# Patient Record
Sex: Male | Born: 1940
Health system: Southern US, Community
[De-identification: ages and names within clinical notes are randomized; demographics above are authoritative.]

## PROBLEM LIST (undated history)

## (undated) DIAGNOSIS — E119 Type 2 diabetes mellitus without complications: Secondary | ICD-10-CM

## (undated) DIAGNOSIS — M109 Gout, unspecified: Secondary | ICD-10-CM

## (undated) DIAGNOSIS — I272 Pulmonary hypertension, unspecified: Secondary | ICD-10-CM

## (undated) DIAGNOSIS — N189 Chronic kidney disease, unspecified: Secondary | ICD-10-CM

## (undated) DIAGNOSIS — C9110 Chronic lymphocytic leukemia of B-cell type not having achieved remission: Secondary | ICD-10-CM

## (undated) DIAGNOSIS — I10 Essential (primary) hypertension: Secondary | ICD-10-CM

## (undated) HISTORY — DX: Chronic lymphocytic leukemia of B-cell type not having achieved remission: C91.10

## (undated) HISTORY — PX: OTHER SURGICAL HISTORY: SHX169

## (undated) HISTORY — DX: Pulmonary hypertension, unspecified: I27.20

## (undated) HISTORY — DX: Gout, unspecified: M10.9

## (undated) HISTORY — DX: Essential (primary) hypertension: I10

## (undated) HISTORY — DX: Chronic kidney disease, unspecified: N18.9

## (undated) HISTORY — DX: Type 2 diabetes mellitus without complications: E11.9

## (undated) HISTORY — PX: EXPLORATORY LAPAROTOMY: SUR591

---

## 2000-10-01 ENCOUNTER — Inpatient Hospital Stay (HOSPITAL_COMMUNITY): Admission: EM | Admit: 2000-10-01 | Discharge: 2000-10-08 | Payer: Self-pay | Admitting: *Deleted

## 2004-02-18 ENCOUNTER — Ambulatory Visit: Payer: Self-pay | Admitting: Internal Medicine

## 2004-02-18 ENCOUNTER — Encounter: Payer: Self-pay | Admitting: Emergency Medicine

## 2004-02-18 ENCOUNTER — Inpatient Hospital Stay (HOSPITAL_COMMUNITY): Admission: EM | Admit: 2004-02-18 | Discharge: 2004-04-14 | Payer: Self-pay

## 2004-02-18 ENCOUNTER — Ambulatory Visit: Payer: Self-pay

## 2004-02-18 ENCOUNTER — Ambulatory Visit: Payer: Self-pay | Admitting: Cardiology

## 2004-02-18 ENCOUNTER — Ambulatory Visit: Payer: Self-pay | Admitting: Pulmonary Disease

## 2004-04-14 ENCOUNTER — Ambulatory Visit: Payer: Self-pay | Admitting: Physical Medicine & Rehabilitation

## 2004-04-14 ENCOUNTER — Inpatient Hospital Stay (HOSPITAL_COMMUNITY)
Admission: RE | Admit: 2004-04-14 | Discharge: 2004-05-02 | Payer: Self-pay | Admitting: Physical Medicine & Rehabilitation

## 2004-05-24 ENCOUNTER — Ambulatory Visit: Payer: Self-pay | Admitting: *Deleted

## 2004-10-19 ENCOUNTER — Ambulatory Visit (HOSPITAL_COMMUNITY): Admission: RE | Admit: 2004-10-19 | Discharge: 2004-10-19 | Payer: Self-pay | Admitting: Orthopedic Surgery

## 2004-10-19 ENCOUNTER — Ambulatory Visit (HOSPITAL_BASED_OUTPATIENT_CLINIC_OR_DEPARTMENT_OTHER): Admission: RE | Admit: 2004-10-19 | Discharge: 2004-10-19 | Payer: Self-pay | Admitting: Orthopedic Surgery
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
## 2010-04-23 ENCOUNTER — Encounter: Payer: Self-pay | Admitting: Physical Medicine & Rehabilitation

## 2011-12-14 ENCOUNTER — Encounter: Payer: Self-pay | Admitting: Internal Medicine

## 2011-12-14 DIAGNOSIS — N183 Chronic kidney disease, stage 3 unspecified: Secondary | ICD-10-CM

## 2011-12-14 DIAGNOSIS — C9110 Chronic lymphocytic leukemia of B-cell type not having achieved remission: Secondary | ICD-10-CM

## 2012-09-12 ENCOUNTER — Encounter (INDEPENDENT_AMBULATORY_CARE_PROVIDER_SITE_OTHER): Payer: Medicare Other | Admitting: Internal Medicine

## 2012-09-12 DIAGNOSIS — C9110 Chronic lymphocytic leukemia of B-cell type not having achieved remission: Secondary | ICD-10-CM

## 2013-02-27 DIAGNOSIS — C9110 Chronic lymphocytic leukemia of B-cell type not having achieved remission: Secondary | ICD-10-CM | POA: Insufficient documentation

## 2013-02-27 DIAGNOSIS — E139 Other specified diabetes mellitus without complications: Secondary | ICD-10-CM | POA: Insufficient documentation

## 2013-02-27 DIAGNOSIS — Z8739 Personal history of other diseases of the musculoskeletal system and connective tissue: Secondary | ICD-10-CM | POA: Insufficient documentation

## 2014-06-16 DIAGNOSIS — R600 Localized edema: Secondary | ICD-10-CM | POA: Insufficient documentation

## 2014-06-16 DIAGNOSIS — N289 Disorder of kidney and ureter, unspecified: Secondary | ICD-10-CM | POA: Insufficient documentation

## 2014-07-06 DIAGNOSIS — R2243 Localized swelling, mass and lump, lower limb, bilateral: Secondary | ICD-10-CM | POA: Insufficient documentation

## 2014-09-09 DIAGNOSIS — K635 Polyp of colon: Secondary | ICD-10-CM | POA: Insufficient documentation

## 2015-04-20 DIAGNOSIS — L570 Actinic keratosis: Secondary | ICD-10-CM | POA: Diagnosis not present

## 2015-04-20 DIAGNOSIS — Z85828 Personal history of other malignant neoplasm of skin: Secondary | ICD-10-CM | POA: Diagnosis not present

## 2015-04-20 DIAGNOSIS — D224 Melanocytic nevi of scalp and neck: Secondary | ICD-10-CM | POA: Diagnosis not present

## 2015-04-20 DIAGNOSIS — D3617 Benign neoplasm of peripheral nerves and autonomic nervous system of trunk, unspecified: Secondary | ICD-10-CM | POA: Diagnosis not present

## 2015-04-20 DIAGNOSIS — D225 Melanocytic nevi of trunk: Secondary | ICD-10-CM | POA: Diagnosis not present

## 2015-04-20 DIAGNOSIS — D485 Neoplasm of uncertain behavior of skin: Secondary | ICD-10-CM | POA: Diagnosis not present

## 2015-04-20 DIAGNOSIS — D2261 Melanocytic nevi of right upper limb, including shoulder: Secondary | ICD-10-CM | POA: Diagnosis not present

## 2015-04-20 DIAGNOSIS — L2089 Other atopic dermatitis: Secondary | ICD-10-CM | POA: Diagnosis not present

## 2015-04-20 DIAGNOSIS — L821 Other seborrheic keratosis: Secondary | ICD-10-CM | POA: Diagnosis not present

## 2015-04-20 DIAGNOSIS — C4442 Squamous cell carcinoma of skin of scalp and neck: Secondary | ICD-10-CM | POA: Diagnosis not present

## 2015-06-08 DIAGNOSIS — R600 Localized edema: Secondary | ICD-10-CM | POA: Diagnosis not present

## 2015-06-08 DIAGNOSIS — C9110 Chronic lymphocytic leukemia of B-cell type not having achieved remission: Secondary | ICD-10-CM | POA: Diagnosis not present

## 2015-06-09 DIAGNOSIS — M25562 Pain in left knee: Secondary | ICD-10-CM | POA: Diagnosis not present

## 2015-06-09 DIAGNOSIS — M25561 Pain in right knee: Secondary | ICD-10-CM | POA: Diagnosis not present

## 2015-06-09 DIAGNOSIS — M25511 Pain in right shoulder: Secondary | ICD-10-CM | POA: Diagnosis not present

## 2015-07-07 DIAGNOSIS — M25562 Pain in left knee: Secondary | ICD-10-CM | POA: Diagnosis not present

## 2015-07-07 DIAGNOSIS — M25511 Pain in right shoulder: Secondary | ICD-10-CM | POA: Diagnosis not present

## 2015-07-07 DIAGNOSIS — M25561 Pain in right knee: Secondary | ICD-10-CM | POA: Diagnosis not present

## 2015-07-08 DIAGNOSIS — C9110 Chronic lymphocytic leukemia of B-cell type not having achieved remission: Secondary | ICD-10-CM | POA: Diagnosis not present

## 2015-07-08 DIAGNOSIS — N2581 Secondary hyperparathyroidism of renal origin: Secondary | ICD-10-CM | POA: Diagnosis not present

## 2015-07-08 DIAGNOSIS — E119 Type 2 diabetes mellitus without complications: Secondary | ICD-10-CM | POA: Diagnosis not present

## 2015-07-08 DIAGNOSIS — I129 Hypertensive chronic kidney disease with stage 1 through stage 4 chronic kidney disease, or unspecified chronic kidney disease: Secondary | ICD-10-CM | POA: Diagnosis not present

## 2015-07-08 DIAGNOSIS — N183 Chronic kidney disease, stage 3 (moderate): Secondary | ICD-10-CM | POA: Diagnosis not present

## 2015-07-08 DIAGNOSIS — M1710 Unilateral primary osteoarthritis, unspecified knee: Secondary | ICD-10-CM | POA: Diagnosis not present

## 2015-07-08 DIAGNOSIS — R600 Localized edema: Secondary | ICD-10-CM | POA: Diagnosis not present

## 2015-07-08 DIAGNOSIS — M109 Gout, unspecified: Secondary | ICD-10-CM | POA: Diagnosis not present

## 2015-07-20 DIAGNOSIS — M25561 Pain in right knee: Secondary | ICD-10-CM | POA: Diagnosis not present

## 2015-07-20 DIAGNOSIS — M25562 Pain in left knee: Secondary | ICD-10-CM | POA: Diagnosis not present

## 2015-07-26 DIAGNOSIS — Z0000 Encounter for general adult medical examination without abnormal findings: Secondary | ICD-10-CM | POA: Diagnosis not present

## 2015-07-26 DIAGNOSIS — E559 Vitamin D deficiency, unspecified: Secondary | ICD-10-CM | POA: Diagnosis not present

## 2015-07-26 DIAGNOSIS — N183 Chronic kidney disease, stage 3 (moderate): Secondary | ICD-10-CM | POA: Diagnosis not present

## 2015-07-26 DIAGNOSIS — G2581 Restless legs syndrome: Secondary | ICD-10-CM | POA: Diagnosis not present

## 2015-07-26 DIAGNOSIS — I739 Peripheral vascular disease, unspecified: Secondary | ICD-10-CM | POA: Diagnosis not present

## 2015-07-26 DIAGNOSIS — E1165 Type 2 diabetes mellitus with hyperglycemia: Secondary | ICD-10-CM | POA: Diagnosis not present

## 2015-07-26 DIAGNOSIS — C9110 Chronic lymphocytic leukemia of B-cell type not having achieved remission: Secondary | ICD-10-CM | POA: Diagnosis not present

## 2015-07-26 DIAGNOSIS — I10 Essential (primary) hypertension: Secondary | ICD-10-CM | POA: Diagnosis not present

## 2015-07-26 DIAGNOSIS — Z1211 Encounter for screening for malignant neoplasm of colon: Secondary | ICD-10-CM | POA: Diagnosis not present

## 2015-07-26 DIAGNOSIS — E7801 Familial hypercholesterolemia: Secondary | ICD-10-CM | POA: Diagnosis not present

## 2015-07-26 DIAGNOSIS — Z6827 Body mass index (BMI) 27.0-27.9, adult: Secondary | ICD-10-CM | POA: Diagnosis not present

## 2015-07-26 DIAGNOSIS — K219 Gastro-esophageal reflux disease without esophagitis: Secondary | ICD-10-CM | POA: Diagnosis not present

## 2015-07-30 DIAGNOSIS — I10 Essential (primary) hypertension: Secondary | ICD-10-CM | POA: Diagnosis not present

## 2015-09-20 DIAGNOSIS — M25561 Pain in right knee: Secondary | ICD-10-CM | POA: Diagnosis not present

## 2015-09-20 DIAGNOSIS — M25562 Pain in left knee: Secondary | ICD-10-CM | POA: Diagnosis not present

## 2015-10-19 DIAGNOSIS — D2261 Melanocytic nevi of right upper limb, including shoulder: Secondary | ICD-10-CM | POA: Diagnosis not present

## 2015-10-19 DIAGNOSIS — Z85828 Personal history of other malignant neoplasm of skin: Secondary | ICD-10-CM | POA: Diagnosis not present

## 2015-10-19 DIAGNOSIS — D2262 Melanocytic nevi of left upper limb, including shoulder: Secondary | ICD-10-CM | POA: Diagnosis not present

## 2015-10-19 DIAGNOSIS — L570 Actinic keratosis: Secondary | ICD-10-CM | POA: Diagnosis not present

## 2015-10-19 DIAGNOSIS — L821 Other seborrheic keratosis: Secondary | ICD-10-CM | POA: Diagnosis not present

## 2015-10-19 DIAGNOSIS — D225 Melanocytic nevi of trunk: Secondary | ICD-10-CM | POA: Diagnosis not present

## 2015-11-24 DIAGNOSIS — E1165 Type 2 diabetes mellitus with hyperglycemia: Secondary | ICD-10-CM | POA: Diagnosis not present

## 2015-11-24 DIAGNOSIS — Z6829 Body mass index (BMI) 29.0-29.9, adult: Secondary | ICD-10-CM | POA: Diagnosis not present

## 2015-12-20 DIAGNOSIS — M25561 Pain in right knee: Secondary | ICD-10-CM | POA: Diagnosis not present

## 2015-12-20 DIAGNOSIS — M25562 Pain in left knee: Secondary | ICD-10-CM | POA: Diagnosis not present

## 2016-01-10 DIAGNOSIS — J180 Bronchopneumonia, unspecified organism: Secondary | ICD-10-CM | POA: Diagnosis not present

## 2016-01-11 DIAGNOSIS — D2239 Melanocytic nevi of other parts of face: Secondary | ICD-10-CM | POA: Diagnosis not present

## 2016-01-11 DIAGNOSIS — L309 Dermatitis, unspecified: Secondary | ICD-10-CM | POA: Diagnosis not present

## 2016-01-11 DIAGNOSIS — D485 Neoplasm of uncertain behavior of skin: Secondary | ICD-10-CM | POA: Diagnosis not present

## 2016-01-11 DIAGNOSIS — Z85828 Personal history of other malignant neoplasm of skin: Secondary | ICD-10-CM | POA: Diagnosis not present

## 2016-01-11 DIAGNOSIS — L570 Actinic keratosis: Secondary | ICD-10-CM | POA: Diagnosis not present

## 2016-01-17 DIAGNOSIS — E119 Type 2 diabetes mellitus without complications: Secondary | ICD-10-CM | POA: Diagnosis not present

## 2016-01-17 DIAGNOSIS — M109 Gout, unspecified: Secondary | ICD-10-CM | POA: Diagnosis not present

## 2016-01-17 DIAGNOSIS — N183 Chronic kidney disease, stage 3 (moderate): Secondary | ICD-10-CM | POA: Diagnosis not present

## 2016-01-17 DIAGNOSIS — R600 Localized edema: Secondary | ICD-10-CM | POA: Diagnosis not present

## 2016-01-17 DIAGNOSIS — C9110 Chronic lymphocytic leukemia of B-cell type not having achieved remission: Secondary | ICD-10-CM | POA: Diagnosis not present

## 2016-01-17 DIAGNOSIS — M1710 Unilateral primary osteoarthritis, unspecified knee: Secondary | ICD-10-CM | POA: Diagnosis not present

## 2016-01-17 DIAGNOSIS — Z6828 Body mass index (BMI) 28.0-28.9, adult: Secondary | ICD-10-CM | POA: Diagnosis not present

## 2016-01-17 DIAGNOSIS — I129 Hypertensive chronic kidney disease with stage 1 through stage 4 chronic kidney disease, or unspecified chronic kidney disease: Secondary | ICD-10-CM | POA: Diagnosis not present

## 2016-01-17 DIAGNOSIS — N2581 Secondary hyperparathyroidism of renal origin: Secondary | ICD-10-CM | POA: Diagnosis not present

## 2016-02-21 ENCOUNTER — Telehealth (INDEPENDENT_AMBULATORY_CARE_PROVIDER_SITE_OTHER): Payer: Self-pay | Admitting: Orthopedic Surgery

## 2016-02-21 NOTE — Telephone Encounter (Signed)
Vicky from the [facility] called wanting to speak to you about approval or denial for this pt ad a few more.

## 2016-02-22 NOTE — Telephone Encounter (Signed)
Jeffrey Brennan patient

## 2016-02-22 NOTE — Telephone Encounter (Signed)
For denied Monovisc

## 2016-03-29 DIAGNOSIS — E1165 Type 2 diabetes mellitus with hyperglycemia: Secondary | ICD-10-CM | POA: Diagnosis not present

## 2016-04-21 DIAGNOSIS — D225 Melanocytic nevi of trunk: Secondary | ICD-10-CM | POA: Diagnosis not present

## 2016-04-21 DIAGNOSIS — L570 Actinic keratosis: Secondary | ICD-10-CM | POA: Diagnosis not present

## 2016-04-21 DIAGNOSIS — Z85828 Personal history of other malignant neoplasm of skin: Secondary | ICD-10-CM | POA: Diagnosis not present

## 2016-04-21 DIAGNOSIS — D2261 Melanocytic nevi of right upper limb, including shoulder: Secondary | ICD-10-CM | POA: Diagnosis not present

## 2016-07-11 ENCOUNTER — Telehealth (INDEPENDENT_AMBULATORY_CARE_PROVIDER_SITE_OTHER): Payer: Self-pay | Admitting: Orthopaedic Surgery

## 2016-07-11 NOTE — Telephone Encounter (Signed)
PT WANTS TO [facility] INJECTION. I WAS NOT SURE WHAT IT WAS BUT HE STATED IT DID NEED INS APPROVAL.  PLEASE ADVISE.  [phone]

## 2016-07-11 NOTE — Telephone Encounter (Signed)
Ordered injection ,patient aware

## 2016-07-13 ENCOUNTER — Telehealth (INDEPENDENT_AMBULATORY_CARE_PROVIDER_SITE_OTHER): Payer: Self-pay

## 2016-07-13 NOTE — Telephone Encounter (Signed)
Can you make an appt for him next available with Artis Delay or Ninfa Linden for that injection (in a week or two-no rush)

## 2016-07-26 DIAGNOSIS — Z6828 Body mass index (BMI) 28.0-28.9, adult: Secondary | ICD-10-CM | POA: Diagnosis not present

## 2016-07-26 DIAGNOSIS — M109 Gout, unspecified: Secondary | ICD-10-CM | POA: Diagnosis not present

## 2016-07-26 DIAGNOSIS — N2581 Secondary hyperparathyroidism of renal origin: Secondary | ICD-10-CM | POA: Diagnosis not present

## 2016-07-26 DIAGNOSIS — C9190 Lymphoid leukemia, unspecified not having achieved remission: Secondary | ICD-10-CM | POA: Diagnosis not present

## 2016-07-26 DIAGNOSIS — E119 Type 2 diabetes mellitus without complications: Secondary | ICD-10-CM | POA: Diagnosis not present

## 2016-07-26 DIAGNOSIS — I129 Hypertensive chronic kidney disease with stage 1 through stage 4 chronic kidney disease, or unspecified chronic kidney disease: Secondary | ICD-10-CM | POA: Diagnosis not present

## 2016-07-26 DIAGNOSIS — N183 Chronic kidney disease, stage 3 (moderate): Secondary | ICD-10-CM | POA: Diagnosis not present

## 2016-07-26 DIAGNOSIS — R600 Localized edema: Secondary | ICD-10-CM | POA: Diagnosis not present

## 2016-07-26 DIAGNOSIS — M1710 Unilateral primary osteoarthritis, unspecified knee: Secondary | ICD-10-CM | POA: Diagnosis not present

## 2016-07-31 ENCOUNTER — Ambulatory Visit (INDEPENDENT_AMBULATORY_CARE_PROVIDER_SITE_OTHER): Payer: PPO | Admitting: Physician Assistant

## 2016-07-31 DIAGNOSIS — M170 Bilateral primary osteoarthritis of knee: Secondary | ICD-10-CM

## 2016-07-31 MED ORDER — HYALURONAN 88 MG/4ML IX SOSY
88.0000 mg | PREFILLED_SYRINGE | INTRA_ARTICULAR | Status: AC | PRN
  Administered 2016-07-31: 88 mg via INTRA_ARTICULAR

## 2016-07-31 NOTE — Progress Notes (Signed)
Office Visit Note   Patient: Jeffrey Brennan           Date of Birth: 08/11/1940           MRN: [id number] Visit Date: 07/31/2016              Requested by: No referring provider defined for this encounter. PCP: Celedonio Savage, MD   Assessment & Plan: Visit Diagnoses:  1. Primary osteoarthritis of both knees     Plan:We'll see him back on an as-needed basis E understands that he can have him on a [facility] injections both knees every 6 months. In regards to cortisone injections every 3 months. He'll let us know when he needs another injection attempted to discuss with him that if his left knee continues to bother we will obtain a new x-ray of his knee and possibly give him a cortisone injection.  Follow-Up Instructions: Return if symptoms worsen or fail to improve.   Orders:  No orders of the defined types were placed in this encounter.  No orders of the defined types were placed in this encounter.     Procedures: Large Joint Inj Date/Time: 07/31/2016 11:06 AM Performed by: Pete Pelt Authorized by: Pete Pelt   Location:  Knee Site:  R knee Needle Size:  22 G Needle Length:  1.5 inches and 3.5 inches Approach:  Anterolateral Ultrasound Guidance: No   Fluoroscopic Guidance: No   Arthrogram: No   Medications:  88 mg Hyaluronan 88 MG/4ML Aspiration Attempted: No   Patient tolerance:  Patient tolerated the procedure well with no immediate complications Large Joint Inj Date/Time: 07/31/2016 11:06 AM Performed by: Pete Pelt Authorized by: Pete Pelt   Location:  Knee Site:  L knee Needle Size:  22 G Needle Length:  1.5 inches and 3.5 inches Approach:  Anterolateral Ultrasound Guidance: No   Fluoroscopic Guidance: No   Arthrogram: No   Medications:  88 mg Hyaluronan 88 MG/4ML Aspiration Attempted: No   Patient tolerance:  Patient tolerated the procedure well with no immediate complications     Clinical Data: No additional  findings.   Subjective: Left greater than right knee pain  HPI Mr. Jeffrey Brennan returns today for mono this injections both knees. He states his left knee is giving him a fit acute pain up at night. 10 no known injury. He states that his right knee generally is one that bothers him but now it is his left knee. He has mild arthritis both knees this came in periodically for steroid injections. Last steroid injections were in September. He states the last time  he was here in the office that his wife died that same day.   Review of Systems See history of present illness  Objective: Vital Signs: There were no vitals taken for this visit.  Physical Exam Well-developed well-nourished male in no acute distress. He uses no assistive device to ambulate Ortho Exam Bilateral knees good range of motion. No effusion abnormal warmth or erythema of either knee. No instability valgus varus stressing of either knee. Tenderness along the lateral joint line of the left knee Specialty Comments:  No specialty comments available.  Imaging: No results found.   PMFS History: There are no active problems to display for this patient.  No past medical history on file.  No family history on file.  No past surgical history on file. Social History   Occupational History  . Not on file.   Social History Main Topics  .  Smoking status: Not on file  . Smokeless tobacco: Not on file  . Alcohol use Not on file  . Drug use: Unknown  . Sexual activity: Not on file

## 2016-08-08 DIAGNOSIS — I10 Essential (primary) hypertension: Secondary | ICD-10-CM | POA: Diagnosis not present

## 2016-08-08 DIAGNOSIS — N183 Chronic kidney disease, stage 3 (moderate): Secondary | ICD-10-CM | POA: Diagnosis not present

## 2016-08-08 DIAGNOSIS — C9110 Chronic lymphocytic leukemia of B-cell type not having achieved remission: Secondary | ICD-10-CM | POA: Diagnosis not present

## 2016-08-08 DIAGNOSIS — Z6827 Body mass index (BMI) 27.0-27.9, adult: Secondary | ICD-10-CM | POA: Diagnosis not present

## 2016-08-08 DIAGNOSIS — E1165 Type 2 diabetes mellitus with hyperglycemia: Secondary | ICD-10-CM | POA: Diagnosis not present

## 2016-08-08 DIAGNOSIS — Z1211 Encounter for screening for malignant neoplasm of colon: Secondary | ICD-10-CM | POA: Diagnosis not present

## 2016-08-08 DIAGNOSIS — Z0000 Encounter for general adult medical examination without abnormal findings: Secondary | ICD-10-CM | POA: Diagnosis not present

## 2016-08-08 DIAGNOSIS — E7801 Familial hypercholesterolemia: Secondary | ICD-10-CM | POA: Diagnosis not present

## 2016-08-18 DIAGNOSIS — N183 Chronic kidney disease, stage 3 (moderate): Secondary | ICD-10-CM | POA: Diagnosis not present

## 2016-08-18 DIAGNOSIS — E1122 Type 2 diabetes mellitus with diabetic chronic kidney disease: Secondary | ICD-10-CM | POA: Diagnosis not present

## 2016-08-18 DIAGNOSIS — C9190 Lymphoid leukemia, unspecified not having achieved remission: Secondary | ICD-10-CM | POA: Diagnosis not present

## 2016-08-18 DIAGNOSIS — N289 Disorder of kidney and ureter, unspecified: Secondary | ICD-10-CM | POA: Diagnosis not present

## 2016-08-18 DIAGNOSIS — K635 Polyp of colon: Secondary | ICD-10-CM | POA: Diagnosis not present

## 2016-08-18 DIAGNOSIS — M109 Gout, unspecified: Secondary | ICD-10-CM | POA: Diagnosis not present

## 2016-10-23 DIAGNOSIS — C9190 Lymphoid leukemia, unspecified not having achieved remission: Secondary | ICD-10-CM | POA: Diagnosis not present

## 2016-10-23 DIAGNOSIS — I10 Essential (primary) hypertension: Secondary | ICD-10-CM | POA: Diagnosis not present

## 2016-10-23 DIAGNOSIS — D72820 Lymphocytosis (symptomatic): Secondary | ICD-10-CM | POA: Diagnosis not present

## 2016-10-23 DIAGNOSIS — N183 Chronic kidney disease, stage 3 (moderate): Secondary | ICD-10-CM | POA: Diagnosis not present

## 2016-10-23 DIAGNOSIS — E109 Type 1 diabetes mellitus without complications: Secondary | ICD-10-CM | POA: Diagnosis not present

## 2016-10-24 DIAGNOSIS — D2261 Melanocytic nevi of right upper limb, including shoulder: Secondary | ICD-10-CM | POA: Diagnosis not present

## 2016-10-24 DIAGNOSIS — D2272 Melanocytic nevi of left lower limb, including hip: Secondary | ICD-10-CM | POA: Diagnosis not present

## 2016-10-24 DIAGNOSIS — L821 Other seborrheic keratosis: Secondary | ICD-10-CM | POA: Diagnosis not present

## 2016-10-24 DIAGNOSIS — D692 Other nonthrombocytopenic purpura: Secondary | ICD-10-CM | POA: Diagnosis not present

## 2016-10-24 DIAGNOSIS — Z85828 Personal history of other malignant neoplasm of skin: Secondary | ICD-10-CM | POA: Diagnosis not present

## 2016-10-24 DIAGNOSIS — L820 Inflamed seborrheic keratosis: Secondary | ICD-10-CM | POA: Diagnosis not present

## 2016-10-24 DIAGNOSIS — L570 Actinic keratosis: Secondary | ICD-10-CM | POA: Diagnosis not present

## 2016-10-24 DIAGNOSIS — D225 Melanocytic nevi of trunk: Secondary | ICD-10-CM | POA: Diagnosis not present

## 2016-11-14 DIAGNOSIS — C9191 Lymphoid leukemia, unspecified, in remission: Secondary | ICD-10-CM | POA: Diagnosis not present

## 2016-11-14 DIAGNOSIS — K219 Gastro-esophageal reflux disease without esophagitis: Secondary | ICD-10-CM | POA: Diagnosis not present

## 2016-11-14 DIAGNOSIS — N183 Chronic kidney disease, stage 3 (moderate): Secondary | ICD-10-CM | POA: Diagnosis not present

## 2016-11-14 DIAGNOSIS — I10 Essential (primary) hypertension: Secondary | ICD-10-CM | POA: Diagnosis not present

## 2016-11-14 DIAGNOSIS — Z6827 Body mass index (BMI) 27.0-27.9, adult: Secondary | ICD-10-CM | POA: Diagnosis not present

## 2016-11-14 DIAGNOSIS — E782 Mixed hyperlipidemia: Secondary | ICD-10-CM | POA: Diagnosis not present

## 2016-11-14 DIAGNOSIS — E119 Type 2 diabetes mellitus without complications: Secondary | ICD-10-CM | POA: Diagnosis not present

## 2016-11-14 DIAGNOSIS — M1A00X0 Idiopathic chronic gout, unspecified site, without tophus (tophi): Secondary | ICD-10-CM | POA: Diagnosis not present

## 2017-01-15 DIAGNOSIS — N2581 Secondary hyperparathyroidism of renal origin: Secondary | ICD-10-CM | POA: Diagnosis not present

## 2017-01-15 DIAGNOSIS — M109 Gout, unspecified: Secondary | ICD-10-CM | POA: Diagnosis not present

## 2017-01-15 DIAGNOSIS — M1710 Unilateral primary osteoarthritis, unspecified knee: Secondary | ICD-10-CM | POA: Diagnosis not present

## 2017-01-15 DIAGNOSIS — R600 Localized edema: Secondary | ICD-10-CM | POA: Diagnosis not present

## 2017-01-15 DIAGNOSIS — N183 Chronic kidney disease, stage 3 (moderate): Secondary | ICD-10-CM | POA: Diagnosis not present

## 2017-01-15 DIAGNOSIS — E119 Type 2 diabetes mellitus without complications: Secondary | ICD-10-CM | POA: Diagnosis not present

## 2017-01-15 DIAGNOSIS — Z6828 Body mass index (BMI) 28.0-28.9, adult: Secondary | ICD-10-CM | POA: Diagnosis not present

## 2017-01-15 DIAGNOSIS — I129 Hypertensive chronic kidney disease with stage 1 through stage 4 chronic kidney disease, or unspecified chronic kidney disease: Secondary | ICD-10-CM | POA: Diagnosis not present

## 2017-01-15 DIAGNOSIS — C9190 Lymphoid leukemia, unspecified not having achieved remission: Secondary | ICD-10-CM | POA: Diagnosis not present

## 2017-02-12 DIAGNOSIS — Z6827 Body mass index (BMI) 27.0-27.9, adult: Secondary | ICD-10-CM | POA: Diagnosis not present

## 2017-02-12 DIAGNOSIS — L03012 Cellulitis of left finger: Secondary | ICD-10-CM | POA: Diagnosis not present

## 2017-02-19 DIAGNOSIS — C9190 Lymphoid leukemia, unspecified not having achieved remission: Secondary | ICD-10-CM | POA: Diagnosis not present

## 2017-02-28 DIAGNOSIS — C9190 Lymphoid leukemia, unspecified not having achieved remission: Secondary | ICD-10-CM | POA: Diagnosis not present

## 2017-02-28 DIAGNOSIS — E1122 Type 2 diabetes mellitus with diabetic chronic kidney disease: Secondary | ICD-10-CM | POA: Diagnosis not present

## 2017-02-28 DIAGNOSIS — N183 Chronic kidney disease, stage 3 (moderate): Secondary | ICD-10-CM | POA: Diagnosis not present

## 2017-02-28 DIAGNOSIS — Z8739 Personal history of other diseases of the musculoskeletal system and connective tissue: Secondary | ICD-10-CM | POA: Diagnosis not present

## 2017-03-06 DIAGNOSIS — L300 Nummular dermatitis: Secondary | ICD-10-CM | POA: Diagnosis not present

## 2017-03-06 DIAGNOSIS — Z85828 Personal history of other malignant neoplasm of skin: Secondary | ICD-10-CM | POA: Diagnosis not present

## 2017-03-20 DIAGNOSIS — F3342 Major depressive disorder, recurrent, in full remission: Secondary | ICD-10-CM | POA: Diagnosis not present

## 2017-03-20 DIAGNOSIS — C9191 Lymphoid leukemia, unspecified, in remission: Secondary | ICD-10-CM | POA: Diagnosis not present

## 2017-03-20 DIAGNOSIS — E782 Mixed hyperlipidemia: Secondary | ICD-10-CM | POA: Diagnosis not present

## 2017-03-20 DIAGNOSIS — M1A00X0 Idiopathic chronic gout, unspecified site, without tophus (tophi): Secondary | ICD-10-CM | POA: Diagnosis not present

## 2017-03-20 DIAGNOSIS — E119 Type 2 diabetes mellitus without complications: Secondary | ICD-10-CM | POA: Diagnosis not present

## 2017-03-20 DIAGNOSIS — K219 Gastro-esophageal reflux disease without esophagitis: Secondary | ICD-10-CM | POA: Diagnosis not present

## 2017-03-20 DIAGNOSIS — N183 Chronic kidney disease, stage 3 (moderate): Secondary | ICD-10-CM | POA: Diagnosis not present

## 2017-03-20 DIAGNOSIS — Z6827 Body mass index (BMI) 27.0-27.9, adult: Secondary | ICD-10-CM | POA: Diagnosis not present

## 2017-03-20 DIAGNOSIS — I10 Essential (primary) hypertension: Secondary | ICD-10-CM | POA: Diagnosis not present

## 2017-04-02 DIAGNOSIS — J0110 Acute frontal sinusitis, unspecified: Secondary | ICD-10-CM | POA: Diagnosis not present

## 2017-04-02 DIAGNOSIS — J069 Acute upper respiratory infection, unspecified: Secondary | ICD-10-CM | POA: Diagnosis not present

## 2017-04-02 DIAGNOSIS — Z6826 Body mass index (BMI) 26.0-26.9, adult: Secondary | ICD-10-CM | POA: Diagnosis not present

## 2017-04-30 DIAGNOSIS — Z85828 Personal history of other malignant neoplasm of skin: Secondary | ICD-10-CM | POA: Diagnosis not present

## 2017-04-30 DIAGNOSIS — L821 Other seborrheic keratosis: Secondary | ICD-10-CM | POA: Diagnosis not present

## 2017-04-30 DIAGNOSIS — L570 Actinic keratosis: Secondary | ICD-10-CM | POA: Diagnosis not present

## 2017-04-30 DIAGNOSIS — D225 Melanocytic nevi of trunk: Secondary | ICD-10-CM | POA: Diagnosis not present

## 2017-04-30 DIAGNOSIS — L309 Dermatitis, unspecified: Secondary | ICD-10-CM | POA: Diagnosis not present

## 2017-06-07 DIAGNOSIS — Z6827 Body mass index (BMI) 27.0-27.9, adult: Secondary | ICD-10-CM | POA: Diagnosis not present

## 2017-06-07 DIAGNOSIS — H0100A Unspecified blepharitis right eye, upper and lower eyelids: Secondary | ICD-10-CM | POA: Diagnosis not present

## 2017-07-02 DIAGNOSIS — R0600 Dyspnea, unspecified: Secondary | ICD-10-CM | POA: Diagnosis not present

## 2017-07-02 DIAGNOSIS — N189 Chronic kidney disease, unspecified: Secondary | ICD-10-CM | POA: Diagnosis not present

## 2017-07-02 DIAGNOSIS — E1122 Type 2 diabetes mellitus with diabetic chronic kidney disease: Secondary | ICD-10-CM | POA: Diagnosis not present

## 2017-07-02 DIAGNOSIS — D649 Anemia, unspecified: Secondary | ICD-10-CM | POA: Diagnosis not present

## 2017-07-02 DIAGNOSIS — Z888 Allergy status to other drugs, medicaments and biological substances status: Secondary | ICD-10-CM | POA: Diagnosis not present

## 2017-07-02 DIAGNOSIS — I129 Hypertensive chronic kidney disease with stage 1 through stage 4 chronic kidney disease, or unspecified chronic kidney disease: Secondary | ICD-10-CM | POA: Diagnosis not present

## 2017-07-02 DIAGNOSIS — C9190 Lymphoid leukemia, unspecified not having achieved remission: Secondary | ICD-10-CM | POA: Diagnosis not present

## 2017-07-02 DIAGNOSIS — M170 Bilateral primary osteoarthritis of knee: Secondary | ICD-10-CM | POA: Diagnosis not present

## 2017-07-02 DIAGNOSIS — N289 Disorder of kidney and ureter, unspecified: Secondary | ICD-10-CM | POA: Diagnosis not present

## 2017-07-02 DIAGNOSIS — D630 Anemia in neoplastic disease: Secondary | ICD-10-CM | POA: Diagnosis not present

## 2017-07-06 ENCOUNTER — Telehealth (INDEPENDENT_AMBULATORY_CARE_PROVIDER_SITE_OTHER): Payer: Self-pay | Admitting: Orthopaedic Surgery

## 2017-07-06 DIAGNOSIS — R600 Localized edema: Secondary | ICD-10-CM | POA: Diagnosis not present

## 2017-07-06 DIAGNOSIS — N183 Chronic kidney disease, stage 3 (moderate): Secondary | ICD-10-CM | POA: Diagnosis not present

## 2017-07-06 DIAGNOSIS — M1710 Unilateral primary osteoarthritis, unspecified knee: Secondary | ICD-10-CM | POA: Diagnosis not present

## 2017-07-06 DIAGNOSIS — C9190 Lymphoid leukemia, unspecified not having achieved remission: Secondary | ICD-10-CM | POA: Diagnosis not present

## 2017-07-06 DIAGNOSIS — I129 Hypertensive chronic kidney disease with stage 1 through stage 4 chronic kidney disease, or unspecified chronic kidney disease: Secondary | ICD-10-CM | POA: Diagnosis not present

## 2017-07-06 DIAGNOSIS — M109 Gout, unspecified: Secondary | ICD-10-CM | POA: Diagnosis not present

## 2017-07-06 DIAGNOSIS — E1122 Type 2 diabetes mellitus with diabetic chronic kidney disease: Secondary | ICD-10-CM | POA: Diagnosis not present

## 2017-07-06 DIAGNOSIS — R0602 Shortness of breath: Secondary | ICD-10-CM | POA: Diagnosis not present

## 2017-07-06 DIAGNOSIS — N2581 Secondary hyperparathyroidism of renal origin: Secondary | ICD-10-CM | POA: Diagnosis not present

## 2017-07-06 NOTE — Telephone Encounter (Signed)
Patient came into the clinic wanting to get another Monovisc injection.  CB#[phone].  Thank you.

## 2017-07-09 DIAGNOSIS — C9191 Lymphoid leukemia, unspecified, in remission: Secondary | ICD-10-CM | POA: Diagnosis not present

## 2017-07-09 DIAGNOSIS — M1A00X0 Idiopathic chronic gout, unspecified site, without tophus (tophi): Secondary | ICD-10-CM | POA: Diagnosis not present

## 2017-07-09 DIAGNOSIS — E782 Mixed hyperlipidemia: Secondary | ICD-10-CM | POA: Diagnosis not present

## 2017-07-09 DIAGNOSIS — I10 Essential (primary) hypertension: Secondary | ICD-10-CM | POA: Diagnosis not present

## 2017-07-09 DIAGNOSIS — K219 Gastro-esophageal reflux disease without esophagitis: Secondary | ICD-10-CM | POA: Diagnosis not present

## 2017-07-09 DIAGNOSIS — N183 Chronic kidney disease, stage 3 (moderate): Secondary | ICD-10-CM | POA: Diagnosis not present

## 2017-07-09 DIAGNOSIS — E119 Type 2 diabetes mellitus without complications: Secondary | ICD-10-CM | POA: Diagnosis not present

## 2017-07-09 DIAGNOSIS — Z6827 Body mass index (BMI) 27.0-27.9, adult: Secondary | ICD-10-CM | POA: Diagnosis not present

## 2017-07-09 NOTE — Telephone Encounter (Signed)
Ok for him to have this

## 2017-07-10 ENCOUNTER — Telehealth (INDEPENDENT_AMBULATORY_CARE_PROVIDER_SITE_OTHER): Payer: Self-pay

## 2017-07-10 NOTE — Telephone Encounter (Signed)
Submitted application online for Monovisc Injection, bilateral knee.

## 2017-07-10 NOTE — Telephone Encounter (Signed)
Noted.

## 2017-07-12 DIAGNOSIS — C9191 Lymphoid leukemia, unspecified, in remission: Secondary | ICD-10-CM | POA: Diagnosis not present

## 2017-07-12 DIAGNOSIS — R0600 Dyspnea, unspecified: Secondary | ICD-10-CM | POA: Diagnosis not present

## 2017-07-12 DIAGNOSIS — N183 Chronic kidney disease, stage 3 (moderate): Secondary | ICD-10-CM | POA: Diagnosis not present

## 2017-07-17 ENCOUNTER — Telehealth (INDEPENDENT_AMBULATORY_CARE_PROVIDER_SITE_OTHER): Payer: Self-pay

## 2017-07-17 NOTE — Telephone Encounter (Signed)
Faxed patient's insurance information to [employer], Program Case Manager at [phone] for bilateral knee.

## 2017-07-19 DIAGNOSIS — I517 Cardiomegaly: Secondary | ICD-10-CM | POA: Diagnosis not present

## 2017-07-19 DIAGNOSIS — R0600 Dyspnea, unspecified: Secondary | ICD-10-CM | POA: Diagnosis not present

## 2017-07-19 DIAGNOSIS — I272 Pulmonary hypertension, unspecified: Secondary | ICD-10-CM | POA: Diagnosis not present

## 2017-07-19 DIAGNOSIS — I358 Other nonrheumatic aortic valve disorders: Secondary | ICD-10-CM | POA: Diagnosis not present

## 2017-07-30 ENCOUNTER — Telehealth (INDEPENDENT_AMBULATORY_CARE_PROVIDER_SITE_OTHER): Payer: Self-pay

## 2017-07-30 DIAGNOSIS — L309 Dermatitis, unspecified: Secondary | ICD-10-CM | POA: Diagnosis not present

## 2017-07-30 DIAGNOSIS — Z85828 Personal history of other malignant neoplasm of skin: Secondary | ICD-10-CM | POA: Diagnosis not present

## 2017-07-30 NOTE — Telephone Encounter (Signed)
Called and left VM advising patient that he is approved to have Monovisc injection, bilateral knee.   Kimberly Patient is responsible for 20% OOP, which could be an estimate of $350.00 No co-pay Appt.scheduled 08/07/17 with Dr. Ninfa Linden

## 2017-08-07 ENCOUNTER — Ambulatory Visit (INDEPENDENT_AMBULATORY_CARE_PROVIDER_SITE_OTHER): Payer: PPO

## 2017-08-07 ENCOUNTER — Encounter (INDEPENDENT_AMBULATORY_CARE_PROVIDER_SITE_OTHER): Payer: Self-pay | Admitting: Orthopaedic Surgery

## 2017-08-07 ENCOUNTER — Ambulatory Visit (INDEPENDENT_AMBULATORY_CARE_PROVIDER_SITE_OTHER): Payer: PPO | Admitting: Orthopaedic Surgery

## 2017-08-07 DIAGNOSIS — M25551 Pain in right hip: Secondary | ICD-10-CM | POA: Diagnosis not present

## 2017-08-07 DIAGNOSIS — M545 Low back pain, unspecified: Secondary | ICD-10-CM

## 2017-08-07 DIAGNOSIS — M170 Bilateral primary osteoarthritis of knee: Secondary | ICD-10-CM | POA: Diagnosis not present

## 2017-08-07 DIAGNOSIS — M1712 Unilateral primary osteoarthritis, left knee: Secondary | ICD-10-CM | POA: Diagnosis not present

## 2017-08-07 DIAGNOSIS — M1711 Unilateral primary osteoarthritis, right knee: Secondary | ICD-10-CM | POA: Diagnosis not present

## 2017-08-07 NOTE — Progress Notes (Addendum)
Office Visit Note   Patient: Jeffrey Brennan           Date of Birth: [birth year]           MRN: [id number] Visit Date: 08/07/2017              Requested by: Jeffrey Savage, MD Jeffrey Brennan, Jeffrey Brennan [id number] PCP: Jeffrey Brennan, Jeffrey Brennan   Assessment & Plan: Visit Diagnoses:  1. Low back pain without sciatica, unspecified back pain laterality, unspecified chronicity   2. Pain in right hip   3. Primary osteoarthritis of both knees     Plan: I will see him back in about 8 weeks to see how he is done with the Monovisc injections.  We will send him to physical therapy for his back pain and right hip bursitis. they are to mainly work on IT band stretching hamstring stretching core strengthening and home exercise program.    Follow-Up Instructions: Return in about 8 weeks (around 10/02/2017).   Orders:  Orders Placed This Encounter  Procedures  . XR Lumbar Spine 2-3 Views  . XR HIP UNILAT W OR W/O PELVIS 1V RIGHT   No orders of the defined types were placed in this encounter.     Procedures: Large Joint Inj: bilateral knee on 10/02/2017 10:23 AM Indications: pain Details: 22 G 1.5 in needle, anterolateral approach  Arthrogram: No  Medications (Right): 88 mg Hyaluronan 88 MG/4ML Medications (Left): 88 mg Hyaluronan 88 MG/4ML Outcome: tolerated well, no immediate complications Procedure, treatment alternatives, risks and benefits explained, specific risks discussed. Consent was given by the patient. Immediately prior to procedure a time out was called to verify the correct patient, procedure, equipment, support staff and site/side marked as required. Patient was prepped and draped in the usual sterile fashion.       Clinical Data: No additional findings.   Subjective: Chief Complaint  Patient presents with  . Left Knee - Follow-up  . Right Knee - Follow-up    HPI Mr. Jeffrey Brennan comes in today for bilateral knee Monovisc injections.  He last had Monovisc injections last  April.  He did well until recently.  He is also having some low back pain and pain in his right hip.  He status post right hip hemi-arthroplasty by Dr. due to 2005.  He has had no injury.  He is having no radicular symptoms down either leg.  No waking pain.  Review of Systems See HPI otherwise negative  Objective: Vital Signs: There were no vitals taken for this visit.  Physical Exam  Constitutional: He is oriented to person, place, and time. He appears well-developed and well-nourished. No distress.  Pulmonary/Chest: Effort normal.  Neurological: He is alert and oriented to person, place, and time.  Skin: He is not diaphoretic.  Psychiatric: He has a normal mood and affect.    Ortho Exam Bilateral hips he has good range of motion without pain.  Negative straight leg raise bilaterally tight hamstrings bilaterally.  Tenderness over the right trochanteric region.  5 out of 5 strength throughout the lower extremities against resistance.  Bilateral knees without effusion abnormal warmth or erythema.  Good range of motion both knees without pain. Specialty Comments:  No specialty comments available.  Imaging: Xr Hip Unilat W Or W/o Pelvis 1v Right  Result Date: 08/07/2017 AP pelvis lateral view of the right hip: Status post right hip hemiarthroplasty.  No acute fractures.  No evidence of loosening femoral component.  No  bony abnormalities otherwise.  Xr Lumbar Spine 2-3 Views  Result Date: 08/07/2017 AP lateral lumbar spine: Degenerative changes throughout with endplate spurring of all the lumbar vertebrae.  Loss of disc space at L3-4.  No acute fractures.  Lower lumbar with facet arthritic changes.  No spondylolisthesis.    PMFS History: There are no active problems to display for this patient.  History reviewed. No pertinent past medical history.  History reviewed. No pertinent family history.  History reviewed. No pertinent surgical history. Social History   Occupational History    . Not on file  Tobacco Use  . Smoking status: Not on file  Substance and Sexual Activity  . Alcohol use: Not on file  . Drug use: Not on file  . Sexual activity: Not on file

## 2017-08-10 DIAGNOSIS — M545 Low back pain: Secondary | ICD-10-CM | POA: Diagnosis not present

## 2017-08-10 DIAGNOSIS — M6281 Muscle weakness (generalized): Secondary | ICD-10-CM | POA: Diagnosis not present

## 2017-08-10 DIAGNOSIS — M25551 Pain in right hip: Secondary | ICD-10-CM | POA: Diagnosis not present

## 2017-08-10 DIAGNOSIS — M25552 Pain in left hip: Secondary | ICD-10-CM | POA: Diagnosis not present

## 2017-08-13 DIAGNOSIS — M545 Low back pain: Secondary | ICD-10-CM | POA: Diagnosis not present

## 2017-08-13 DIAGNOSIS — M25552 Pain in left hip: Secondary | ICD-10-CM | POA: Diagnosis not present

## 2017-08-13 DIAGNOSIS — M6281 Muscle weakness (generalized): Secondary | ICD-10-CM | POA: Diagnosis not present

## 2017-08-13 DIAGNOSIS — M25551 Pain in right hip: Secondary | ICD-10-CM | POA: Diagnosis not present

## 2017-08-14 DIAGNOSIS — M25552 Pain in left hip: Secondary | ICD-10-CM | POA: Diagnosis not present

## 2017-08-14 DIAGNOSIS — M6281 Muscle weakness (generalized): Secondary | ICD-10-CM | POA: Diagnosis not present

## 2017-08-14 DIAGNOSIS — M545 Low back pain: Secondary | ICD-10-CM | POA: Diagnosis not present

## 2017-08-14 DIAGNOSIS — M25551 Pain in right hip: Secondary | ICD-10-CM | POA: Diagnosis not present

## 2017-08-16 DIAGNOSIS — M25551 Pain in right hip: Secondary | ICD-10-CM | POA: Diagnosis not present

## 2017-08-16 DIAGNOSIS — M545 Low back pain: Secondary | ICD-10-CM | POA: Diagnosis not present

## 2017-08-16 DIAGNOSIS — M25552 Pain in left hip: Secondary | ICD-10-CM | POA: Diagnosis not present

## 2017-08-16 DIAGNOSIS — M6281 Muscle weakness (generalized): Secondary | ICD-10-CM | POA: Diagnosis not present

## 2017-08-21 DIAGNOSIS — M545 Low back pain: Secondary | ICD-10-CM | POA: Diagnosis not present

## 2017-08-21 DIAGNOSIS — M25551 Pain in right hip: Secondary | ICD-10-CM | POA: Diagnosis not present

## 2017-08-21 DIAGNOSIS — M25552 Pain in left hip: Secondary | ICD-10-CM | POA: Diagnosis not present

## 2017-08-21 DIAGNOSIS — M6281 Muscle weakness (generalized): Secondary | ICD-10-CM | POA: Diagnosis not present

## 2017-08-22 DIAGNOSIS — M6281 Muscle weakness (generalized): Secondary | ICD-10-CM | POA: Diagnosis not present

## 2017-08-22 DIAGNOSIS — M25552 Pain in left hip: Secondary | ICD-10-CM | POA: Diagnosis not present

## 2017-08-22 DIAGNOSIS — M545 Low back pain: Secondary | ICD-10-CM | POA: Diagnosis not present

## 2017-08-22 DIAGNOSIS — M25551 Pain in right hip: Secondary | ICD-10-CM | POA: Diagnosis not present

## 2017-08-24 DIAGNOSIS — M25552 Pain in left hip: Secondary | ICD-10-CM | POA: Diagnosis not present

## 2017-08-24 DIAGNOSIS — M6281 Muscle weakness (generalized): Secondary | ICD-10-CM | POA: Diagnosis not present

## 2017-08-24 DIAGNOSIS — M25551 Pain in right hip: Secondary | ICD-10-CM | POA: Diagnosis not present

## 2017-08-24 DIAGNOSIS — M545 Low back pain: Secondary | ICD-10-CM | POA: Diagnosis not present

## 2017-08-29 DIAGNOSIS — M25552 Pain in left hip: Secondary | ICD-10-CM | POA: Diagnosis not present

## 2017-08-29 DIAGNOSIS — M6281 Muscle weakness (generalized): Secondary | ICD-10-CM | POA: Diagnosis not present

## 2017-08-29 DIAGNOSIS — M25551 Pain in right hip: Secondary | ICD-10-CM | POA: Diagnosis not present

## 2017-08-29 DIAGNOSIS — M545 Low back pain: Secondary | ICD-10-CM | POA: Diagnosis not present

## 2017-08-31 DIAGNOSIS — M25552 Pain in left hip: Secondary | ICD-10-CM | POA: Diagnosis not present

## 2017-08-31 DIAGNOSIS — M545 Low back pain: Secondary | ICD-10-CM | POA: Diagnosis not present

## 2017-08-31 DIAGNOSIS — M6281 Muscle weakness (generalized): Secondary | ICD-10-CM | POA: Diagnosis not present

## 2017-08-31 DIAGNOSIS — M25551 Pain in right hip: Secondary | ICD-10-CM | POA: Diagnosis not present

## 2017-09-03 DIAGNOSIS — M6281 Muscle weakness (generalized): Secondary | ICD-10-CM | POA: Diagnosis not present

## 2017-09-03 DIAGNOSIS — M25551 Pain in right hip: Secondary | ICD-10-CM | POA: Diagnosis not present

## 2017-09-03 DIAGNOSIS — M25552 Pain in left hip: Secondary | ICD-10-CM | POA: Diagnosis not present

## 2017-09-03 DIAGNOSIS — M545 Low back pain: Secondary | ICD-10-CM | POA: Diagnosis not present

## 2017-09-05 DIAGNOSIS — M6281 Muscle weakness (generalized): Secondary | ICD-10-CM | POA: Diagnosis not present

## 2017-09-05 DIAGNOSIS — M545 Low back pain: Secondary | ICD-10-CM | POA: Diagnosis not present

## 2017-09-05 DIAGNOSIS — M25552 Pain in left hip: Secondary | ICD-10-CM | POA: Diagnosis not present

## 2017-09-05 DIAGNOSIS — M25551 Pain in right hip: Secondary | ICD-10-CM | POA: Diagnosis not present

## 2017-09-10 DIAGNOSIS — M25552 Pain in left hip: Secondary | ICD-10-CM | POA: Diagnosis not present

## 2017-09-10 DIAGNOSIS — M545 Low back pain: Secondary | ICD-10-CM | POA: Diagnosis not present

## 2017-09-10 DIAGNOSIS — M25551 Pain in right hip: Secondary | ICD-10-CM | POA: Diagnosis not present

## 2017-09-10 DIAGNOSIS — M6281 Muscle weakness (generalized): Secondary | ICD-10-CM | POA: Diagnosis not present

## 2017-09-12 ENCOUNTER — Telehealth (INDEPENDENT_AMBULATORY_CARE_PROVIDER_SITE_OTHER): Payer: Self-pay | Admitting: Orthopaedic Surgery

## 2017-09-12 DIAGNOSIS — M25551 Pain in right hip: Secondary | ICD-10-CM | POA: Diagnosis not present

## 2017-09-12 DIAGNOSIS — M6281 Muscle weakness (generalized): Secondary | ICD-10-CM | POA: Diagnosis not present

## 2017-09-12 DIAGNOSIS — M25552 Pain in left hip: Secondary | ICD-10-CM | POA: Diagnosis not present

## 2017-09-12 DIAGNOSIS — M545 Low back pain: Secondary | ICD-10-CM | POA: Diagnosis not present

## 2017-09-12 NOTE — Telephone Encounter (Signed)
Patient left a message requesting permission to have dry needling done at the [facility] in [place].  CB#[phone].  Thank you.

## 2017-09-12 NOTE — Telephone Encounter (Signed)
Patient aware this is ok

## 2017-09-12 NOTE — Telephone Encounter (Signed)
Patient called asking if any paper work from the patients PT had been received in regards to trying dry needling? CB # [phone]

## 2017-09-12 NOTE — Telephone Encounter (Signed)
OK 

## 2017-09-12 NOTE — Telephone Encounter (Signed)
This ok?

## 2017-09-18 DIAGNOSIS — M6281 Muscle weakness (generalized): Secondary | ICD-10-CM | POA: Diagnosis not present

## 2017-09-18 DIAGNOSIS — M545 Low back pain: Secondary | ICD-10-CM | POA: Diagnosis not present

## 2017-09-18 DIAGNOSIS — M25552 Pain in left hip: Secondary | ICD-10-CM | POA: Diagnosis not present

## 2017-09-18 DIAGNOSIS — M25551 Pain in right hip: Secondary | ICD-10-CM | POA: Diagnosis not present

## 2017-09-20 DIAGNOSIS — M545 Low back pain: Secondary | ICD-10-CM | POA: Diagnosis not present

## 2017-09-20 DIAGNOSIS — M25552 Pain in left hip: Secondary | ICD-10-CM | POA: Diagnosis not present

## 2017-09-20 DIAGNOSIS — M25551 Pain in right hip: Secondary | ICD-10-CM | POA: Diagnosis not present

## 2017-09-20 DIAGNOSIS — M6281 Muscle weakness (generalized): Secondary | ICD-10-CM | POA: Diagnosis not present

## 2017-09-24 DIAGNOSIS — M545 Low back pain: Secondary | ICD-10-CM | POA: Diagnosis not present

## 2017-09-24 DIAGNOSIS — M25552 Pain in left hip: Secondary | ICD-10-CM | POA: Diagnosis not present

## 2017-09-24 DIAGNOSIS — M6281 Muscle weakness (generalized): Secondary | ICD-10-CM | POA: Diagnosis not present

## 2017-09-24 DIAGNOSIS — M25551 Pain in right hip: Secondary | ICD-10-CM | POA: Diagnosis not present

## 2017-09-26 DIAGNOSIS — N289 Disorder of kidney and ureter, unspecified: Secondary | ICD-10-CM | POA: Diagnosis not present

## 2017-09-26 DIAGNOSIS — C9190 Lymphoid leukemia, unspecified not having achieved remission: Secondary | ICD-10-CM | POA: Diagnosis not present

## 2017-09-26 DIAGNOSIS — M25552 Pain in left hip: Secondary | ICD-10-CM | POA: Diagnosis not present

## 2017-09-26 DIAGNOSIS — M545 Low back pain: Secondary | ICD-10-CM | POA: Diagnosis not present

## 2017-09-26 DIAGNOSIS — D630 Anemia in neoplastic disease: Secondary | ICD-10-CM | POA: Diagnosis not present

## 2017-09-26 DIAGNOSIS — M6281 Muscle weakness (generalized): Secondary | ICD-10-CM | POA: Diagnosis not present

## 2017-09-26 DIAGNOSIS — M25551 Pain in right hip: Secondary | ICD-10-CM | POA: Diagnosis not present

## 2017-10-01 DIAGNOSIS — K635 Polyp of colon: Secondary | ICD-10-CM | POA: Diagnosis not present

## 2017-10-02 ENCOUNTER — Encounter (INDEPENDENT_AMBULATORY_CARE_PROVIDER_SITE_OTHER): Payer: Self-pay | Admitting: Orthopaedic Surgery

## 2017-10-02 ENCOUNTER — Ambulatory Visit (INDEPENDENT_AMBULATORY_CARE_PROVIDER_SITE_OTHER): Payer: PPO | Admitting: Orthopaedic Surgery

## 2017-10-02 DIAGNOSIS — M6281 Muscle weakness (generalized): Secondary | ICD-10-CM | POA: Diagnosis not present

## 2017-10-02 DIAGNOSIS — M25551 Pain in right hip: Secondary | ICD-10-CM | POA: Diagnosis not present

## 2017-10-02 DIAGNOSIS — M170 Bilateral primary osteoarthritis of knee: Secondary | ICD-10-CM | POA: Diagnosis not present

## 2017-10-02 DIAGNOSIS — M5441 Lumbago with sciatica, right side: Secondary | ICD-10-CM | POA: Diagnosis not present

## 2017-10-02 DIAGNOSIS — M5442 Lumbago with sciatica, left side: Secondary | ICD-10-CM | POA: Diagnosis not present

## 2017-10-02 DIAGNOSIS — M545 Low back pain: Secondary | ICD-10-CM | POA: Diagnosis not present

## 2017-10-02 DIAGNOSIS — M25552 Pain in left hip: Secondary | ICD-10-CM | POA: Diagnosis not present

## 2017-10-02 MED ORDER — HYALURONAN 88 MG/4ML IX SOSY
88.0000 mg | PREFILLED_SYRINGE | INTRA_ARTICULAR | Status: AC | PRN
  Administered 2017-10-02: 88 mg via INTRA_ARTICULAR

## 2017-10-02 MED ORDER — HYALURONAN 88 MG/4ML IX SOSY
88.0000 mg | PREFILLED_SYRINGE | INTRA_ARTICULAR | Status: AC | PRN
Start: 2017-10-02 — End: 2017-10-02
  Administered 2017-10-02: 88 mg via INTRA_ARTICULAR

## 2017-10-02 NOTE — Progress Notes (Addendum)
Office Visit Note   Patient: Jeffrey Brennan           Date of Birth: 1940-10-15           MRN: [id number] Visit Date: 10/02/2017              Requested by: [name], PA [place], [place] [id number] PCP: [name], [place]   Assessment & Plan: Visit Diagnoses:  1. Primary osteoarthritis of both knees   2. Acute bilateral low back pain with bilateral sciatica     Plan: We will get an MRI of his lumbar spine  To rule out  HNP due to the fact that his hip pain bilaterally is became worse and denies any radicular symptoms down the thighs.  He is also having low back pain particularly on the left lower region.  He is tried physical therapy which helps for about a day does not alleviate his pain.  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  Follow-Up Instructions: Return for after MRI.   Orders:  No orders of the defined types were placed in this encounter.  No orders of the defined types were placed in this encounter.     Procedures: No procedures performed   Clinical Data: No additional findings.   Subjective: Chief Complaint  Patient presents with  . Right Hip - Follow-up    HPI Mr. Gandolfo returns today due to bilateral knee arthritis status post Monovisc injections 08/07/2017.  He states the Monovisc injections did help.  He still has periodic pain in the right knee medial side but his pain is definitely improved.  He is mainly complaining of left hip pain today and low back pain is now having new radicular symptoms into both thighs does not go past the knee.  Most the pain is anterior thighs bilaterally.  Has had no new injury.  He is tried physical therapy which include dry needling.  He states this helps for about a day and then his pain returns. Review of Systems Please see HPI otherwise negative  Objective: Vital Signs: There were no vitals taken for this visit.  Physical Exam  Constitutional: He is oriented to person,  place, and time. He appears well-developed and well-nourished. No distress.  Pulmonary/Chest: Effort normal.  Neurological: He is alert and oriented to person, place, and time.  Skin: He is not diaphoretic.  Psychiatric: He has a normal mood and affect.    Ortho Exam Lower extremities he has 5 out of 5 strength throughout.  Good range of motion of bilateral hips.  Slight tenderness over bilateral trochanteric regions of the hips.  Negative straight leg raise bilaterally.  Tight hamstrings bilaterally good range of motion of both knees.  Lacks last few inches of being able to touch his toes.  Has good extension of the lumbar spine without pain.  Tenderness over the left lower lumbar paraspinous region. Specialty Comments:  No specialty comments available.  Imaging: No results found.   PMFS History: There are no active problems to display for this patient.  No past medical history on file.  No family history on file.  No past surgical history on file. Social History   Occupational History  . Not on file  Tobacco Use  . Smoking status: Not on file  Substance and Sexual Activity  . Alcohol use: Not on file  . Drug use: Not on file  . Sexual activity: Not on file

## 2017-10-03 ENCOUNTER — Other Ambulatory Visit (INDEPENDENT_AMBULATORY_CARE_PROVIDER_SITE_OTHER): Payer: Self-pay

## 2017-10-03 DIAGNOSIS — M4807 Spinal stenosis, lumbosacral region: Secondary | ICD-10-CM

## 2017-10-05 ENCOUNTER — Telehealth (INDEPENDENT_AMBULATORY_CARE_PROVIDER_SITE_OTHER): Payer: Self-pay | Admitting: Orthopaedic Surgery

## 2017-10-05 MED ORDER — TRAMADOL HCL 50 MG PO TABS: 50.0000 mg | ORAL_TABLET | Freq: Three times a day (TID) | ORAL | 0 refills | Status: DC | PRN

## 2017-10-05 NOTE — Telephone Encounter (Signed)
Please advise.

## 2017-10-05 NOTE — Telephone Encounter (Signed)
Pharmacy  [facility]    Pt request for pain med will need med that want effect kidney

## 2017-10-11 ENCOUNTER — Telehealth (INDEPENDENT_AMBULATORY_CARE_PROVIDER_SITE_OTHER): Payer: Self-pay | Admitting: Orthopedic Surgery

## 2017-10-11 ENCOUNTER — Other Ambulatory Visit (INDEPENDENT_AMBULATORY_CARE_PROVIDER_SITE_OTHER): Payer: Self-pay | Admitting: Orthopaedic Surgery

## 2017-10-11 DIAGNOSIS — T1590XA Foreign body on external eye, part unspecified, unspecified eye, initial encounter: Secondary | ICD-10-CM

## 2017-10-11 DIAGNOSIS — M4807 Spinal stenosis, lumbosacral region: Secondary | ICD-10-CM

## 2017-10-11 NOTE — Telephone Encounter (Signed)
Jeffrey Brennan states that his MRI is scheduled for 10/18/17.  He would like to know if it approved through his insurance company.

## 2017-10-12 NOTE — Telephone Encounter (Signed)
IC and sw pt and advised him his insurance [employer] does not require authorization

## 2017-10-15 DIAGNOSIS — D692 Other nonthrombocytopenic purpura: Secondary | ICD-10-CM | POA: Diagnosis not present

## 2017-10-15 DIAGNOSIS — Z85828 Personal history of other malignant neoplasm of skin: Secondary | ICD-10-CM | POA: Diagnosis not present

## 2017-10-15 DIAGNOSIS — D485 Neoplasm of uncertain behavior of skin: Secondary | ICD-10-CM | POA: Diagnosis not present

## 2017-10-15 DIAGNOSIS — D225 Melanocytic nevi of trunk: Secondary | ICD-10-CM | POA: Diagnosis not present

## 2017-10-15 DIAGNOSIS — L821 Other seborrheic keratosis: Secondary | ICD-10-CM | POA: Diagnosis not present

## 2017-10-15 DIAGNOSIS — L570 Actinic keratosis: Secondary | ICD-10-CM | POA: Diagnosis not present

## 2017-10-16 ENCOUNTER — Ambulatory Visit (INDEPENDENT_AMBULATORY_CARE_PROVIDER_SITE_OTHER): Payer: PPO | Admitting: Orthopaedic Surgery

## 2017-10-18 ENCOUNTER — Ambulatory Visit
Admission: RE | Admit: 2017-10-18 | Discharge: 2017-10-18 | Disposition: A | Discharge status: Home / Self Care | Payer: PPO | Source: Ambulatory Visit | Attending: Orthopaedic Surgery | Admitting: Orthopaedic Surgery

## 2017-10-18 DIAGNOSIS — T1590XA Foreign body on external eye, part unspecified, unspecified eye, initial encounter: Secondary | ICD-10-CM

## 2017-10-18 DIAGNOSIS — Z135 Encounter for screening for eye and ear disorders: Secondary | ICD-10-CM | POA: Diagnosis not present

## 2017-10-18 DIAGNOSIS — M545 Low back pain: Secondary | ICD-10-CM | POA: Diagnosis not present

## 2017-10-18 DIAGNOSIS — M4807 Spinal stenosis, lumbosacral region: Secondary | ICD-10-CM

## 2017-10-22 ENCOUNTER — Other Ambulatory Visit (INDEPENDENT_AMBULATORY_CARE_PROVIDER_SITE_OTHER): Payer: Self-pay

## 2017-10-22 ENCOUNTER — Ambulatory Visit (INDEPENDENT_AMBULATORY_CARE_PROVIDER_SITE_OTHER): Payer: PPO | Admitting: Orthopaedic Surgery

## 2017-10-22 ENCOUNTER — Encounter (INDEPENDENT_AMBULATORY_CARE_PROVIDER_SITE_OTHER): Payer: Self-pay | Admitting: Orthopaedic Surgery

## 2017-10-22 DIAGNOSIS — M4807 Spinal stenosis, lumbosacral region: Secondary | ICD-10-CM | POA: Diagnosis not present

## 2017-10-22 DIAGNOSIS — G8929 Other chronic pain: Secondary | ICD-10-CM

## 2017-10-22 DIAGNOSIS — M5442 Lumbago with sciatica, left side: Principal | ICD-10-CM

## 2017-10-22 NOTE — Progress Notes (Signed)
The patient comes in for follow-up after having an MRI of his lumbar spine.  He is having significant low back pain that radiates behind his knee into the front of his knee on both sides.  He has a history of a right hip hemiarthroplasty by Dr. Sharol Given remotely.  He denies any groin pain bilaterally and was sent for an MRI of his lumbar spine due to the severity of his low back pain.  I did send in some tramadol for him but he said that made him too tired.  He takes [place] powders on occasion but tries to avoid these due to kidney disease.  He also takes Tylenol.  On exam he has limited flexion-extension lumbar spine with tight hamstrings bilaterally.  He seems to hurt the sciatic region on both sides he does radiate down past his knee bilaterally.  He is walking without any assistive device but no weakness in his legs.  MRI shows degenerative changes from L2 all the way down.  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  At this point I would like to send him to Dr. Ernestina Patches for bilateral L3-L4 epidural steroid injections to see how this helps with his hip and thigh pain.  All questions concerns were answered and addressed.  I will see him back myself in 4 weeks.

## 2017-10-23 ENCOUNTER — Telehealth (INDEPENDENT_AMBULATORY_CARE_PROVIDER_SITE_OTHER): Payer: Self-pay | Admitting: Orthopaedic Surgery

## 2017-10-23 NOTE — Telephone Encounter (Signed)
Yes ma'am, just push it out until about a week or two after [facility] appointment for followup  Thank you

## 2017-10-23 NOTE — Telephone Encounter (Signed)
Patient is wanting to know if he should keep the appt on 8/19 with Dr. Ninfa Linden since Dr. Ernestina Patches was not able to get him in until 8/12 and that only gives a week in between. Should he reschedule Dr. Adela Ports appt out a little further? Please advise [phone]

## 2017-10-24 NOTE — Telephone Encounter (Signed)
Moved to 8/26.

## 2017-11-08 DIAGNOSIS — E119 Type 2 diabetes mellitus without complications: Secondary | ICD-10-CM | POA: Diagnosis not present

## 2017-11-08 DIAGNOSIS — E782 Mixed hyperlipidemia: Secondary | ICD-10-CM | POA: Diagnosis not present

## 2017-11-08 DIAGNOSIS — Z6827 Body mass index (BMI) 27.0-27.9, adult: Secondary | ICD-10-CM | POA: Diagnosis not present

## 2017-11-08 DIAGNOSIS — M1A00X0 Idiopathic chronic gout, unspecified site, without tophus (tophi): Secondary | ICD-10-CM | POA: Diagnosis not present

## 2017-11-08 DIAGNOSIS — F3342 Major depressive disorder, recurrent, in full remission: Secondary | ICD-10-CM | POA: Diagnosis not present

## 2017-11-08 DIAGNOSIS — N183 Chronic kidney disease, stage 3 (moderate): Secondary | ICD-10-CM | POA: Diagnosis not present

## 2017-11-08 DIAGNOSIS — K219 Gastro-esophageal reflux disease without esophagitis: Secondary | ICD-10-CM | POA: Diagnosis not present

## 2017-11-08 DIAGNOSIS — C9191 Lymphoid leukemia, unspecified, in remission: Secondary | ICD-10-CM | POA: Diagnosis not present

## 2017-11-08 DIAGNOSIS — I10 Essential (primary) hypertension: Secondary | ICD-10-CM | POA: Diagnosis not present

## 2017-11-12 ENCOUNTER — Ambulatory Visit (INDEPENDENT_AMBULATORY_CARE_PROVIDER_SITE_OTHER): Payer: Self-pay

## 2017-11-12 ENCOUNTER — Ambulatory Visit (INDEPENDENT_AMBULATORY_CARE_PROVIDER_SITE_OTHER): Payer: PPO | Admitting: Physical Medicine and Rehabilitation

## 2017-11-12 ENCOUNTER — Encounter (INDEPENDENT_AMBULATORY_CARE_PROVIDER_SITE_OTHER): Payer: Self-pay | Admitting: Physical Medicine and Rehabilitation

## 2017-11-12 VITALS — BP 120/77 | HR 86

## 2017-11-12 DIAGNOSIS — M47816 Spondylosis without myelopathy or radiculopathy, lumbar region: Secondary | ICD-10-CM

## 2017-11-12 DIAGNOSIS — M5416 Radiculopathy, lumbar region: Secondary | ICD-10-CM | POA: Diagnosis not present

## 2017-11-12 DIAGNOSIS — M48062 Spinal stenosis, lumbar region with neurogenic claudication: Secondary | ICD-10-CM | POA: Diagnosis not present

## 2017-11-12 DIAGNOSIS — M5136 Other intervertebral disc degeneration, lumbar region: Secondary | ICD-10-CM | POA: Diagnosis not present

## 2017-11-12 MED ORDER — BETAMETHASONE SOD PHOS & ACET 6 (3-3) MG/ML IJ SUSP
12.0000 mg | Freq: Once | INTRAMUSCULAR | Status: AC
  Administered 2017-11-12: 12 mg

## 2017-11-12 NOTE — Patient Instructions (Signed)

## 2017-11-12 NOTE — Progress Notes (Signed)
 .  Numeric Pain Rating Scale and Functional Assessment Average Pain 5   In the last MONTH (on 0-10 scale) has pain interfered with the following?  1. General activity like being  able to carry out your everyday physical activities such as walking, climbing stairs, carrying groceries, or moving a chair?  Rating(3)   +Driver, -BT, -Dye Allergies.

## 2017-11-13 NOTE — Progress Notes (Signed)
GLENDON Brennan - 77 y.o. male MRN [id number]  Date of birth: 1940-06-11  Office Visit Note: Visit Date: 11/12/2017 PCP: [name], PA Referred by: [name], PA  Subjective: Chief Complaint  Patient presents with  . Lower Back - Pain  . Left Leg - Pain  . Right Leg - Pain   HPI: [name] is a 77 year old gentleman that comes in today at the request of Dr. Jean Rosenthal for interventional spine procedure.  Patient really has failed conservative care including medication management and physical therapy.  He is having pain across the buttock region down into both legs more of an L3 and L4 distribution.  He does get some claudication type symptoms.  He is having pain in the right buttock region that seems to be worsened after physical therapy and deep tissue massage.  He has some pain in the left hip and leg as well as the right.  MRI was reviewed with the patient today.  We are going to complete bilateral L3 transforaminal epidural steroid injection diagnostically hopefully therapeutically.   ROS Otherwise per HPI.  Assessment & Plan: Visit Diagnoses:  1. Lumbar radiculopathy   2. Spondylosis without myelopathy or radiculopathy, lumbar region   3. Spinal stenosis of lumbar region with neurogenic claudication   4. Degenerative disc disease, lumbar     Plan: No additional findings.   Meds & Orders:  Meds ordered this encounter  Medications  . betamethasone acetate-betamethasone sodium phosphate (CELESTONE) injection 12 mg    Orders Placed This Encounter  Procedures  . XR C-ARM NO REPORT  . Epidural Steroid injection    Follow-up: Return if symptoms worsen or fail to improve.   Procedures: No procedures performed  Lumbosacral Transforaminal Epidural Steroid Injection - Sub-Pedicular Approach with Fluoroscopic Guidance  Patient: Jeffrey Brennan      Date of Birth: May 31, 1940 MRN: [id number] PCP: [name], PA      Visit Date: 11/12/2017   Universal  Protocol:    Date/Time: 11/12/2017  Consent Given By: the patient  Position: PRONE  Additional Comments: Vital signs were monitored before and after the procedure. Patient was prepped and draped in the usual sterile fashion. The correct patient, procedure, and site was verified.   Injection Procedure Details:  Procedure Site One Meds Administered:  Meds ordered this encounter  Medications  . betamethasone acetate-betamethasone sodium phosphate (CELESTONE) injection 12 mg    Laterality: Bilateral  Location/Site:  L3-L4  Needle size: 22 G  Needle type: Spinal  Needle Placement: Transforaminal  Findings:    -Comments: Excellent flow of contrast along the nerve and into the epidural space.  Procedure Details: After squaring off the end-plates to get a true AP view, the C-arm was positioned so that an oblique view of the foramen as noted above was visualized. The target area is just inferior to the "nose of the scotty dog" or sub pedicular. The soft tissues overlying this structure were infiltrated with 2-3 ml. of 1% Lidocaine without Epinephrine.  The spinal needle was inserted toward the target using a "trajectory" view along the fluoroscope beam.  Under AP and lateral visualization, the needle was advanced so it did not puncture dura and was located close the 6 O'Clock position of the pedical in AP tracterory. Biplanar projections were used to confirm position. Aspiration was confirmed to be negative for CSF and/or blood. A 1-2 ml. volume of Isovue-250 was injected and flow of contrast was noted at each level. Radiographs  were obtained for documentation purposes.   After attaining the desired flow of contrast documented above, a 0.5 to 1.0 ml test dose of 0.25% Marcaine was injected into each respective transforaminal space.  The patient was observed for 90 seconds post injection.  After no sensory deficits were reported, and normal lower extremity motor function was noted,    the above injectate was administered so that equal amounts of the injectate were placed at each foramen (level) into the transforaminal epidural space.   Additional Comments:  The patient tolerated the procedure well Dressing: Band-Aid    Post-procedure details: Patient was observed during the procedure. Post-procedure instructions were reviewed.  Patient left the clinic in stable condition.     Clinical History: MRI LUMBAR SPINE WITHOUT CONTRAST  TECHNIQUE: Multiplanar, multisequence MR imaging of the lumbar spine was performed. No intravenous contrast was administered.  COMPARISON:  Radiography 08/07/2017  FINDINGS: Segmentation:  5 lumbar type vertebral bodies.  Alignment: Curvature convex to the left with the apex at L2-3. 3 mm anterolisthesis L2-3. 2 mm anterolisthesis L4-5.  Vertebrae:  No fracture or primary bone lesion.  Conus medullaris and cauda equina: Conus extends to the L2 level. Conus and cauda equina appear normal.  Paraspinal and other soft tissues: Negative  Disc levels:  No significant finding at T11-12, T12-L1 or L1-2.  L2-3: Bilateral facet arthropathy with 3 mm of anterolisthesis. Disc degeneration with bulging of the disc. Mild stenosis of both lateral recesses and neural foramina.  L3-4: Disc degeneration with endplate osteophytes and broad-based disc herniation. Slight caudal migration to the right of midline. Bilateral facet and ligamentous hypertrophy. Multifactorial stenosis of the lateral recesses and foramina that could cause neural compression on either or both sides, particularly in the lateral recesses.  L4-5: Bilateral facet arthropathy with 2 mm of anterolisthesis. Shallow protrusion of the disc. Mild stenosis of both lateral recesses and neural foramina which could be symptomatic.  L5-S1: Endplate osteophytes and shallow right paracentral disc herniation. Slight indentation of the thecal sac without  likely neural compression. No significant foraminal narrowing.  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 3 mm anterolisthesis L2-3. 2 mm anterolisthesis L4-5.  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 Findings are most pronounced at L3-4. See above for details at each level.   Electronically Signed   By: Nelson Chimes M.D.   On: 10/18/2017 14:31   He reports that he has quit smoking. He has never used smokeless tobacco. No results for input(s): HGBA1C, LABURIC in the last 8760 hours.  Objective:  VS:  HT:    WT:   BMI:     BP:120/77  HR:86bpm  TEMP: ( )  RESP:  Physical Exam  Ortho Exam Imaging: Xr C-arm No Report  Result Date: 11/12/2017 Please see Notes tab for imaging impression.   Past Medical/Family/Surgical/Social History: Medications & Allergies reviewed per EMR, new medications updated. There are no active problems to display for this patient.  History reviewed. No pertinent past medical history. History reviewed. No pertinent family history. History reviewed. No pertinent surgical history. Social History   Occupational History  . Not on file  Tobacco Use  . Smoking status: Former Research scientist (life sciences)  . Smokeless tobacco: Never Used  Substance and Sexual Activity  . Alcohol use: Yes  . Drug use: Never  . Sexual activity: Not on file

## 2017-11-13 NOTE — Procedures (Signed)
Lumbosacral Transforaminal Epidural Steroid Injection - Sub-Pedicular Approach with Fluoroscopic Guidance  Patient: Jeffrey Brennan      Date of Birth: Dec 22, 1940 MRN: [id number] PCP: [name], PA      Visit Date: 11/12/2017   Universal Protocol:    Date/Time: 11/12/2017  Consent Given By: the patient  Position: PRONE  Additional Comments: Vital signs were monitored before and after the procedure. Patient was prepped and draped in the usual sterile fashion. The correct patient, procedure, and site was verified.   Injection Procedure Details:  Procedure Site One Meds Administered:  Meds ordered this encounter  Medications  . betamethasone acetate-betamethasone sodium phosphate (CELESTONE) injection 12 mg    Laterality: Bilateral  Location/Site:  L3-L4  Needle size: 22 G  Needle type: Spinal  Needle Placement: Transforaminal  Findings:    -Comments: Excellent flow of contrast along the nerve and into the epidural space.  Procedure Details: After squaring off the end-plates to get a true AP view, the C-arm was positioned so that an oblique view of the foramen as noted above was visualized. The target area is just inferior to the "nose of the scotty dog" or sub pedicular. The soft tissues overlying this structure were infiltrated with 2-3 ml. of 1% Lidocaine without Epinephrine.  The spinal needle was inserted toward the target using a "trajectory" view along the fluoroscope beam.  Under AP and lateral visualization, the needle was advanced so it did not puncture dura and was located close the 6 O'Clock position of the pedical in AP tracterory. Biplanar projections were used to confirm position. Aspiration was confirmed to be negative for CSF and/or blood. A 1-2 ml. volume of Isovue-250 was injected and flow of contrast was noted at each level. Radiographs were obtained for documentation purposes.   After attaining the desired flow of contrast documented above, a 0.5  to 1.0 ml test dose of 0.25% Marcaine was injected into each respective transforaminal space.  The patient was observed for 90 seconds post injection.  After no sensory deficits were reported, and normal lower extremity motor function was noted,   the above injectate was administered so that equal amounts of the injectate were placed at each foramen (level) into the transforaminal epidural space.   Additional Comments:  The patient tolerated the procedure well Dressing: Band-Aid    Post-procedure details: Patient was observed during the procedure. Post-procedure instructions were reviewed.  Patient left the clinic in stable condition.

## 2017-11-19 ENCOUNTER — Ambulatory Visit (INDEPENDENT_AMBULATORY_CARE_PROVIDER_SITE_OTHER): Payer: PPO | Admitting: Orthopaedic Surgery

## 2017-11-20 DIAGNOSIS — Z681 Body mass index (BMI) 19 or less, adult: Secondary | ICD-10-CM | POA: Diagnosis not present

## 2017-11-20 DIAGNOSIS — L03115 Cellulitis of right lower limb: Secondary | ICD-10-CM | POA: Diagnosis not present

## 2017-11-21 ENCOUNTER — Telehealth (INDEPENDENT_AMBULATORY_CARE_PROVIDER_SITE_OTHER): Payer: Self-pay | Admitting: *Deleted

## 2017-11-21 ENCOUNTER — Encounter (INDEPENDENT_AMBULATORY_CARE_PROVIDER_SITE_OTHER): Payer: Self-pay | Admitting: *Deleted

## 2017-11-21 ENCOUNTER — Encounter (INDEPENDENT_AMBULATORY_CARE_PROVIDER_SITE_OTHER): Payer: Self-pay | Admitting: Internal Medicine

## 2017-11-21 ENCOUNTER — Ambulatory Visit (INDEPENDENT_AMBULATORY_CARE_PROVIDER_SITE_OTHER): Payer: PPO | Admitting: Internal Medicine

## 2017-11-21 VITALS — BP 140/62 | HR 72 | Temp 97.8°F | Ht 69.0 in | Wt 185.0 lb

## 2017-11-21 DIAGNOSIS — Z860100 Personal history of colon polyps, unspecified: Secondary | ICD-10-CM | POA: Insufficient documentation

## 2017-11-21 DIAGNOSIS — D500 Iron deficiency anemia secondary to blood loss (chronic): Secondary | ICD-10-CM | POA: Diagnosis not present

## 2017-11-21 MED ORDER — SUPREP BOWEL PREP KIT 17.5-3.13-1.6 GM/177ML PO SOLN: 1.0000 | Freq: Once | ORAL | 0 refills | Status: AC

## 2017-11-21 NOTE — Patient Instructions (Addendum)
Labs today. The risks of bleeding, perforation and infection were reviewed with patient.
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
## 2017-11-21 NOTE — Telephone Encounter (Signed)
Patient needs suprep

## 2017-11-22 LAB — CBC WITH DIFFERENTIAL/PLATELET
BASOS ABS: 134 {cells}/uL (ref 0–200)
Basophils Relative: 0.4 %
EOS PCT: 2.1 %
Eosinophils Absolute: 701 cells/uL — ABNORMAL HIGH (ref 15–500)
HCT: 39.7 % (ref 38.5–50.0)
HEMOGLOBIN: 13.3 g/dL (ref 13.2–17.1)
LYMPHS ABS: 25217 {cells}/uL — AB (ref 850–3900)
MCH: 29 pg (ref 27.0–33.0)
MCHC: 33.5 g/dL (ref 32.0–36.0)
MCV: 86.5 fL (ref 80.0–100.0)
MPV: 10.2 fL (ref 7.5–12.5)
Monocytes Relative: 3.4 %
NEUTROS PCT: 18.6 %
Neutro Abs: 6212 cells/uL (ref 1500–7800)
Platelets: 250 10*3/uL (ref 140–400)
RBC: 4.59 10*6/uL (ref 4.20–5.80)
RDW: 14.9 % (ref 11.0–15.0)
TOTAL LYMPHOCYTE: 75.5 %
WBC mixed population: 1136 cells/uL — ABNORMAL HIGH (ref 200–950)
WBC: 33.4 10*3/uL — AB (ref 3.8–10.8)

## 2017-11-22 LAB — IRON, TOTAL/TOTAL IRON BINDING CAP
%SAT: 15 % (calc) — ABNORMAL LOW (ref 20–48)
Iron: 54 ug/dL (ref 50–180)
TIBC: 358 mcg/dL (calc) (ref 250–425)

## 2017-11-22 LAB — FERRITIN: FERRITIN: 22 ng/mL — AB (ref 24–380)

## 2017-11-26 ENCOUNTER — Other Ambulatory Visit (INDEPENDENT_AMBULATORY_CARE_PROVIDER_SITE_OTHER): Payer: Self-pay

## 2017-11-26 ENCOUNTER — Encounter (INDEPENDENT_AMBULATORY_CARE_PROVIDER_SITE_OTHER): Payer: Self-pay | Admitting: Orthopaedic Surgery

## 2017-11-26 ENCOUNTER — Ambulatory Visit (INDEPENDENT_AMBULATORY_CARE_PROVIDER_SITE_OTHER): Payer: PPO | Admitting: Orthopaedic Surgery

## 2017-11-26 DIAGNOSIS — M5442 Lumbago with sciatica, left side: Secondary | ICD-10-CM | POA: Diagnosis not present

## 2017-11-26 DIAGNOSIS — M545 Low back pain: Secondary | ICD-10-CM

## 2017-11-26 DIAGNOSIS — G8929 Other chronic pain: Secondary | ICD-10-CM | POA: Diagnosis not present

## 2017-11-26 NOTE — Progress Notes (Signed)
The patient is following up 2 weeks today status post bilateral L3-L4 transforaminal injections by Dr. Ernestina Patches.  Fortunately these did not adversely affect his blood glucose.  He does report a mild cellulitis around his right knee it is treated with antibiotics now.  He is not taking any anti-inflammatories right now due to an upcoming colonoscopy on September 6.  He said the injections have not taken the way he hoped to.  He says he did not sleep well for a week in terms of just wide awake at night.  He still complains about pain in his hips to go with his feet on both sides.  His symptoms are worse on left than the right.  Apparently his sister works in the orthopedic office in [place] and said there is a physiatry [place] there sometimes will offer injections again at a later.  And he is interested in trying this again.  He takes tramadol only on occasion but tries to avoid this due to it making him too sleepy.  He does have chronic renal insufficiency as well.  On exam his joint exam as far as his bilateral hips knees she has no significant findings.  He does have more positive straight leg raise on the left side than the right with radicular pain going down both legs.  He would like an appointment again with Dr. Ernestina Patches in about a month with understanding he would likely not have injections then but is discussed repeat injections and wear other areas may be helpful for him having these injections.  He still would like to try this route of the surgical referral and I agree with this.

## 2017-11-27 DIAGNOSIS — Z6827 Body mass index (BMI) 27.0-27.9, adult: Secondary | ICD-10-CM | POA: Diagnosis not present

## 2017-11-27 DIAGNOSIS — L03115 Cellulitis of right lower limb: Secondary | ICD-10-CM | POA: Diagnosis not present

## 2017-12-06 ENCOUNTER — Encounter (INDEPENDENT_AMBULATORY_CARE_PROVIDER_SITE_OTHER): Payer: Self-pay | Admitting: Physical Medicine and Rehabilitation

## 2017-12-06 ENCOUNTER — Ambulatory Visit (INDEPENDENT_AMBULATORY_CARE_PROVIDER_SITE_OTHER): Payer: PPO | Admitting: Physical Medicine and Rehabilitation

## 2017-12-06 VITALS — BP 125/80 | HR 87

## 2017-12-06 DIAGNOSIS — M48062 Spinal stenosis, lumbar region with neurogenic claudication: Secondary | ICD-10-CM | POA: Diagnosis not present

## 2017-12-06 DIAGNOSIS — M5136 Other intervertebral disc degeneration, lumbar region: Secondary | ICD-10-CM | POA: Diagnosis not present

## 2017-12-06 DIAGNOSIS — M5416 Radiculopathy, lumbar region: Secondary | ICD-10-CM
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
## 2017-12-06 NOTE — Progress Notes (Signed)
  Numeric Pain Rating Scale and Functional Assessment Average Pain 10   In the last MONTH (on 0-10 scale) has pain interfered with the following?  1. General activity like being  able to carry out your everyday physical activities such as walking, climbing stairs, carrying groceries, or moving a chair?  Rating(0

## 2017-12-10 ENCOUNTER — Ambulatory Visit (HOSPITAL_COMMUNITY)
Admission: RE | Admit: 2017-12-10 | Discharge: 2017-12-10 | Disposition: A | Discharge status: Home / Self Care | Payer: PPO | Source: Ambulatory Visit | Attending: Internal Medicine | Admitting: Internal Medicine

## 2017-12-10 ENCOUNTER — Other Ambulatory Visit: Payer: Self-pay

## 2017-12-10 ENCOUNTER — Encounter (HOSPITAL_COMMUNITY): Payer: Self-pay | Admitting: *Deleted

## 2017-12-10 ENCOUNTER — Encounter (HOSPITAL_COMMUNITY)
Admission: RE | Disposition: A | Discharge status: Home / Self Care | Payer: Self-pay | Source: Ambulatory Visit | Attending: Internal Medicine

## 2017-12-10 DIAGNOSIS — Z860100 Personal history of colon polyps, unspecified: Secondary | ICD-10-CM | POA: Insufficient documentation

## 2017-12-10 DIAGNOSIS — D123 Benign neoplasm of transverse colon: Secondary | ICD-10-CM | POA: Diagnosis not present

## 2017-12-10 DIAGNOSIS — K648 Other hemorrhoids: Secondary | ICD-10-CM | POA: Diagnosis not present

## 2017-12-10 DIAGNOSIS — K5140 Inflammatory polyps of colon without complications: Secondary | ICD-10-CM | POA: Diagnosis not present

## 2017-12-10 DIAGNOSIS — K317 Polyp of stomach and duodenum: Secondary | ICD-10-CM | POA: Insufficient documentation

## 2017-12-10 DIAGNOSIS — K219 Gastro-esophageal reflux disease without esophagitis: Secondary | ICD-10-CM | POA: Diagnosis not present

## 2017-12-10 DIAGNOSIS — Z79899 Other long term (current) drug therapy: Secondary | ICD-10-CM | POA: Insufficient documentation

## 2017-12-10 DIAGNOSIS — I129 Hypertensive chronic kidney disease with stage 1 through stage 4 chronic kidney disease, or unspecified chronic kidney disease: Secondary | ICD-10-CM | POA: Diagnosis not present

## 2017-12-10 DIAGNOSIS — Z7984 Long term (current) use of oral hypoglycemic drugs: Secondary | ICD-10-CM | POA: Diagnosis not present

## 2017-12-10 DIAGNOSIS — E1122 Type 2 diabetes mellitus with diabetic chronic kidney disease: Secondary | ICD-10-CM | POA: Diagnosis not present

## 2017-12-10 DIAGNOSIS — Z888 Allergy status to other drugs, medicaments and biological substances status: Secondary | ICD-10-CM | POA: Insufficient documentation

## 2017-12-10 DIAGNOSIS — D500 Iron deficiency anemia secondary to blood loss (chronic): Secondary | ICD-10-CM | POA: Insufficient documentation

## 2017-12-10 DIAGNOSIS — M109 Gout, unspecified: Secondary | ICD-10-CM | POA: Insufficient documentation

## 2017-12-10 DIAGNOSIS — D509 Iron deficiency anemia, unspecified: Secondary | ICD-10-CM | POA: Diagnosis not present

## 2017-12-10 DIAGNOSIS — Z87891 Personal history of nicotine dependence: Secondary | ICD-10-CM | POA: Insufficient documentation

## 2017-12-10 DIAGNOSIS — N189 Chronic kidney disease, unspecified: Secondary | ICD-10-CM | POA: Insufficient documentation

## 2017-12-10 DIAGNOSIS — C9110 Chronic lymphocytic leukemia of B-cell type not having achieved remission: Secondary | ICD-10-CM | POA: Diagnosis not present

## 2017-12-10 HISTORY — PX: POLYPECTOMY: SHX5525

## 2017-12-10 HISTORY — PX: COLONOSCOPY: SHX5424

## 2017-12-10 HISTORY — PX: ESOPHAGOGASTRODUODENOSCOPY: SHX5428

## 2017-12-10 LAB — GLUCOSE, CAPILLARY: GLUCOSE-CAPILLARY: 132 mg/dL — AB (ref 70–99)

## 2017-12-10 SURGERY — EGD (ESOPHAGOGASTRODUODENOSCOPY)
Anesthesia: Moderate Sedation

## 2017-12-10 MED ORDER — MIDAZOLAM HCL 5 MG/5ML IJ SOLN
INTRAMUSCULAR | Status: AC
  Filled 2017-12-10: qty 10

## 2017-12-10 MED ORDER — SODIUM CHLORIDE 0.9 % IV SOLN
INTRAVENOUS | Status: DC
  Administered 2017-12-10: 12:00:00 via INTRAVENOUS

## 2017-12-10 MED ORDER — MEPERIDINE HCL 50 MG/ML IJ SOLN
INTRAMUSCULAR | Status: AC
  Filled 2017-12-10: qty 1

## 2017-12-10 MED ORDER — FLINTSTONES PLUS IRON PO CHEW: 1.0000 | CHEWABLE_TABLET | Freq: Two times a day (BID) | ORAL | Status: DC

## 2017-12-10 MED ORDER — STERILE WATER FOR IRRIGATION IR SOLN
Status: DC | PRN
  Administered 2017-12-10: 13:00:00

## 2017-12-10 MED ORDER — MEPERIDINE HCL 50 MG/ML IJ SOLN
INTRAMUSCULAR | Status: DC | PRN
  Administered 2017-12-10 (×2): 25 mg via INTRAVENOUS

## 2017-12-10 MED ORDER — LIDOCAINE VISCOUS HCL 2 % MT SOLN
OROMUCOSAL | Status: AC
  Filled 2017-12-10: qty 15

## 2017-12-10 MED ORDER — MIDAZOLAM HCL 5 MG/5ML IJ SOLN
INTRAMUSCULAR | Status: DC | PRN
  Administered 2017-12-10: 1 mg via INTRAVENOUS
  Administered 2017-12-10: 2 mg via INTRAVENOUS
  Administered 2017-12-10: 1 mg via INTRAVENOUS
  Administered 2017-12-10: 2 mg via INTRAVENOUS

## 2017-12-10 MED ORDER — LIDOCAINE VISCOUS HCL 2 % MT SOLN
OROMUCOSAL | Status: DC | PRN
  Administered 2017-12-10: 4 mL via OROMUCOSAL

## 2017-12-10 NOTE — Op Note (Signed)
[facility] Patient Name: Jeffrey Brennan Procedure Date: 12/10/2017 1:41 PM MRN: [id number] Date of Birth: [birth year] Attending MD: Hildred Laser , MD CSN: [id number] Age: 77 Admit Type: Outpatient Procedure:                Colonoscopy Indications:              Unexplained iron deficiency anemia: history of                            colonic polyps Providers:                Hildred Laser, MD, Otis Peak B. Sharon Seller, RN, Nelma Rothman, Technician Referring MD:             Dierdre Highman, MD and Aggie Hacker, PA(PCP) Medicines:                None Complications:            No immediate complications. Estimated Blood Loss:     Estimated blood loss: none. Procedure:                Pre-Anesthesia Assessment:                           - Prior to the procedure, a History and Physical                            was performed, and patient medications and                            allergies were reviewed. The patient's tolerance of                            previous anesthesia was also reviewed. The risks                            and benefits of the procedure and the sedation                            options and risks were discussed with the patient.                            All questions were answered, and informed consent                            was obtained. Prior Anticoagulants: The patient has                            taken no previous anticoagulant or antiplatelet                            agents. ASA Grade Assessment: III - A patient with  severe systemic disease. After reviewing the risks                            and benefits, the patient was deemed in                            satisfactory condition to undergo the procedure.                           After obtaining informed consent, the colonoscope                            was passed under direct vision. Throughout the                            procedure, the  patient's blood pressure, pulse, and                            oxygen saturations were monitored continuously. The                            PCF-H190DL ([id number]) was introduced through the                            anus and advanced to the the cecum, identified by                            appendiceal orifice and ileocecal valve. The                            colonoscopy was performed without difficulty. The                            patient tolerated the procedure well. The quality                            of the bowel preparation was adequate. The                            ileocecal valve, appendiceal orifice, and rectum                            were photographed. Scope In: 1:43:22 PM Scope Out: 2:04:24 PM Scope Withdrawal Time: 0 hours 15 minutes 47 seconds  Total Procedure Duration: 0 hours 21 minutes 2 seconds  Findings:      The perianal and digital rectal examinations were normal.      A 7 mm polyp was found in the splenic flexure. The polyp was sessile.       The polyp was removed with a hot snare. Resection and retrieval were       complete. The pathology specimen was placed into Bottle Number 2.      The exam was otherwise normal throughout the examined colon.      Internal hemorrhoids were found during retroflexion. The hemorrhoids  were small. Impression:               - One 7 mm polyp at the splenic flexure, removed                            with a hot snare. Resected and retrieved.                           - Internal hemorrhoids. Moderate Sedation:      Moderate (conscious) sedation was administered by the endoscopy nurse       and supervised by the endoscopist. The following parameters were       monitored: oxygen saturation, heart rate, blood pressure, CO2       capnography and response to care. Total physician intraservice time was       27 minutes. Recommendation:           - Patient has a contact number available for                             emergencies. The signs and symptoms of potential                            delayed complications were discussed with the                            patient. Return to normal activities tomorrow.                            Written discharge instructions were provided to the                            patient.                           - Resume previous diet today.                           - Continue present medications.                           - No aspirin, ibuprofen, naproxen, or other                            non-steroidal anti-inflammatory drugs for 7 days                            after polyp removal.                           - Flintstones chewable one tablet bid.                           - Await pathology results.                           - Repeat colonoscopy in 5 years for surveillance.  Comment: IDA could be due to blood loss from                            gastric or colonic polyp while he was on NSAIDs or                            he could have iron malsbsorption due to chronic PPI                            use. Procedure Code(s):        --- Professional ---                           [phone], Colonoscopy, flexible; with removal of                            tumor(s), polyp(s), or other lesion(s) by snare                            technique                           G0500, Moderate sedation services provided by the                            same physician or other qualified health care                            professional performing a gastrointestinal                            endoscopic service that sedation supports,                            requiring the presence of an independent trained                            observer to assist in the monitoring of the                            patient's level of consciousness and physiological                            status; initial 15 minutes of intra-service time;                             patient age 1 years or older (additional time may                            be reported with [id number], as appropriate)                           [phone], Moderate sedation services provided by the  same physician or other qualified health care                            professional performing the diagnostic or                            therapeutic service that the sedation supports,                            requiring the presence of an independent trained                            observer to assist in the monitoring of the                            patient's level of consciousness and physiological                            status; each additional 15 minutes intraservice                            time (List separately in addition to code for                            primary service) Diagnosis Code(s):        --- Professional ---                           D12.3, Benign neoplasm of transverse colon (hepatic                            flexure or splenic flexure)                           K64.8, Other hemorrhoids                           D50.9, Iron deficiency anemia, unspecified CPT copyright 2017 American Medical Association. All rights reserved. The codes documented in this report are preliminary and upon coder review may  be revised to meet current compliance requirements. Hildred Laser, MD Hildred Laser, MD 12/10/2017 2:29:57 PM This report has been signed electronically. Number of Addenda: 0

## 2017-12-10 NOTE — Op Note (Signed)
[facility] Patient Name: Jeffrey Brennan Procedure Date: 12/10/2017 1:24 PM MRN: [id number] Date of Birth: 24-May-1940 Attending MD: Hildred Laser , MD CSN: [id number] Age: 77 Admit Type: Outpatient Procedure:                Upper GI endoscopy Indications:              Unexplained iron deficiency anemia Providers:                Hildred Laser, MD, Otis Peak B. Sharon Seller, RN, Nelma Rothman, Technician Referring MD:             Wanita Chamberlain, MD and Aggie Hacker, PA (PCP). Medicines:                Lidocaine jelly, Meperidine 50 mg IV, Midazolam 6                            mg IV Complications:            No immediate complications. Estimated Blood Loss:     Estimated blood loss: none. Procedure:                Pre-Anesthesia Assessment:                           - Prior to the procedure, a History and Physical                            was performed, and patient medications and                            allergies were reviewed. The patient's tolerance of                            previous anesthesia was also reviewed. The risks                            and benefits of the procedure and the sedation                            options and risks were discussed with the patient.                            All questions were answered, and informed consent                            was obtained. Prior Anticoagulants: The patient has                            taken no previous anticoagulant or antiplatelet                            agents. ASA Grade Assessment: III - A patient with  severe systemic disease. After reviewing the risks                            and benefits, the patient was deemed in                            satisfactory condition to undergo the procedure.                           After obtaining informed consent, the endoscope was                            passed under direct vision. Throughout the            procedure, the patient's blood pressure, pulse, and                            oxygen saturations were monitored continuously. The                            GIF-H190 ([id number]) scope was introduced through the                            mouth, and advanced to the second part of duodenum.                            The upper GI endoscopy was accomplished without                            difficulty. The patient tolerated the procedure                            well. Scope In: 1:25:09 PM Scope Out: 1:37:20 PM Total Procedure Duration: 0 hours 12 minutes 11 seconds  Findings:      The examined esophagus was normal.      The Z-line was regular and was found 40 cm from the incisors.      A single 7 mm sessile polyp with no bleeding and no stigmata of recent       bleeding was found in the gastric body and on the lesser curvature of       the stomach.      A single 10 mm sessile ulcerated polyp polyp with no bleeding and no       stigmata of recent bleeding was found in the gastric body along greater       curvature of the stomach. The polyp was removed with a hot snare.       Resection and retrieval were complete. The pathology specimen was placed       into Bottle Number 1.      The exam of the stomach was otherwise normal.      The duodenal bulb and second portion of the duodenum were normal. Impression:               - Normal esophagus.                           -  Z-line regular, 40 cm from the incisors.                           - A single gastric polyp.                           - A single ulcerated gastric polyp. Resected and                            retrieved.                           - Normal duodenal bulb and second portion of the                            duodenum.                           Comment: Both poyps submitted together.                           Suspect hyperplastic polyps. Moderate Sedation:      Moderate (conscious) sedation was administered by the  endoscopy nurse       and supervised by the endoscopist. The following parameters were       monitored: oxygen saturation, heart rate, blood pressure, CO2       capnography and response to care. Total physician intraservice time was       16 minutes. Recommendation:           - Patient has a contact number available for                            emergencies. The signs and symptoms of potential                            delayed complications were discussed with the                            patient. Return to normal activities tomorrow.                            Written discharge instructions were provided to the                            patient.                           - Resume previous diet today.                           - Continue present medications.                           - No aspirin, ibuprofen, naproxen, or other                            non-steroidal anti-inflammatory drugs for 7  days                            after polyp removal.                           - Await pathology results.                           - See the other procedure note for documentation of                            additional recommendations. Procedure Code(s):        --- Professional ---                           [phone], Esophagogastroduodenoscopy, flexible,                            transoral; with removal of tumor(s), polyp(s), or                            other lesion(s) by snare technique                           G0500, Moderate sedation services provided by the                            same physician or other qualified health care                            professional performing a gastrointestinal                            endoscopic service that sedation supports,                            requiring the presence of an independent trained                            observer to assist in the monitoring of the                            patient's level of consciousness and  physiological                            status; initial 15 minutes of intra-service time;                            patient age 75 years or older (additional time may                            be reported with [phone], as appropriate) Diagnosis Code(s):        --- Professional ---  K31.7, Polyp of stomach and duodenum                           D50.9, Iron deficiency anemia, unspecified CPT copyright 2017 American Medical Association. All rights reserved. The codes documented in this report are preliminary and upon coder review may  be revised to meet current compliance requirements. Hildred Laser, MD Hildred Laser, MD 12/10/2017 2:20:57 PM This report has been signed electronically. Number of Addenda: 0

## 2017-12-10 NOTE — Discharge Instructions (Signed)
No aspirin or NSAIDs for 1 week. Resume usual medications as before. [place] chewable with iron twice daily. Resume usual diet. No driving for 24 hours. Physician will l call with biopsy results.   Colonoscopy, Adult, Care After This sheet gives you information about how to care for yourself after your procedure. Your doctor may also give you more specific instructions. If you have problems or questions, call your doctor. Follow these instructions at home: General instructions   For the first 24 hours after the procedure: ? Do not drive or use machinery. ? Do not sign important documents. ? Do not drink alcohol. ? Do your daily activities more slowly than normal. ? Eat foods that are soft and easy to digest. ? Rest often.  Take over-the-counter or prescription medicines only as told by your doctor.  It is up to you to get the results of your procedure. Ask your doctor, or the department performing the procedure, when your results will be ready. To help cramping and bloating:  Try walking around.  Put heat on your belly (abdomen) as told by your doctor. Use a heat source that your doctor recommends, such as a moist heat pack or a heating pad. ? Put a towel between your skin and the heat source. ? Leave the heat on for 20-30 minutes. ? Remove the heat if your skin turns bright red. This is especially important if you cannot feel pain, heat, or cold. You can get burned. Eating and drinking  Drink enough fluid to keep your pee (urine) clear or pale yellow.  Return to your normal diet as told by your doctor. Avoid heavy or fried foods that are hard to digest.  Avoid drinking alcohol for as long as told by your doctor. Contact a doctor if:  You have blood in your poop (stool) 2-3 days after the procedure. Get help right away if:  You have more than a small amount of blood in your poop.  You see large clumps of tissue (blood clots) in your poop.  Your belly is  swollen.  You feel sick to your stomach (nauseous).  You throw up (vomit).  You have a fever.  You have belly pain that gets worse, and medicine does not help your pain. This information is not intended to replace advice given to you by your health care provider. Make sure you discuss any questions you have with your health care provider. Document Released: 04/22/2010 Document Revised: 12/13/2015 Document Reviewed: 12/13/2015 Elsevier Interactive Patient Education  2017 [facility], Care After Refer to this sheet in the next few weeks. These instructions provide you with information about caring for yourself after your procedure. Your health care provider may also give you more specific instructions. Your treatment has been planned according to current medical practices, but problems sometimes occur. Call your health care provider if you have any problems or questions after your procedure. What can I expect after the procedure? After the procedure, it is common to have:  A sore throat.  Bloating.  Nausea.  Follow these instructions at home:  Follow instructions from your health care provider about what to eat or drink after your procedure.  Return to your normal activities as told by your health care provider. Ask your health care provider what activities are safe for you.  Take over-the-counter and prescription medicines only as told by your health care provider.  Do not drive for 24 hours if you received a sedative.  Keep all follow-up  visits as told by your health care provider. This is important. Contact a health care provider if:  You have a sore throat that lasts longer than one day.  You have trouble swallowing. Get help right away if:  You have a fever.  You vomit blood or your vomit looks like coffee grounds.  You have bloody, black, or tarry stools.  You have a severe sore throat or you cannot swallow.  You have difficulty  breathing.  You have severe pain in your chest or belly. This information is not intended to replace advice given to you by your health care provider. Make sure you discuss any questions you have with your health care provider. Document Released: 09/19/2011 Document Revised: 08/26/2015 Document Reviewed: 12/31/2014 Elsevier Interactive Patient Education  [employer].
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
## 2017-12-13 ENCOUNTER — Encounter (HOSPITAL_COMMUNITY): Payer: Self-pay | Admitting: Internal Medicine

## 2017-12-14 ENCOUNTER — Encounter (INDEPENDENT_AMBULATORY_CARE_PROVIDER_SITE_OTHER): Payer: Self-pay | Admitting: Physical Medicine and Rehabilitation

## 2017-12-14 ENCOUNTER — Ambulatory Visit (INDEPENDENT_AMBULATORY_CARE_PROVIDER_SITE_OTHER): Payer: Self-pay

## 2017-12-14 ENCOUNTER — Ambulatory Visit (INDEPENDENT_AMBULATORY_CARE_PROVIDER_SITE_OTHER): Payer: PPO | Admitting: Physical Medicine and Rehabilitation

## 2017-12-14 VITALS — BP 137/65 | HR 78

## 2017-12-14 DIAGNOSIS — M48062 Spinal stenosis, lumbar region with neurogenic claudication: Secondary | ICD-10-CM | POA: Diagnosis not present

## 2017-12-14 DIAGNOSIS — M5416 Radiculopathy, lumbar region: Secondary | ICD-10-CM | POA: Diagnosis not present

## 2017-12-14 MED ORDER — METHYLPREDNISOLONE ACETATE 80 MG/ML IJ SUSP
80.0000 mg | Freq: Once | INTRAMUSCULAR | Status: AC
  Administered 2017-12-14: 80 mg

## 2017-12-14 NOTE — Patient Instructions (Signed)

## 2017-12-14 NOTE — Progress Notes (Signed)
Numeric Pain Rating Scale and Functional Assessment Average Pain 10   In the last MONTH (on 0-10 scale) has pain interfered with the following?  1. General activity like being  able to carry out your everyday physical activities such as walking, climbing stairs, carrying groceries, or moving a chair?  Rating(0)   +Driver, -BT, -Dye Allergies.
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
## 2017-12-27 NOTE — Procedures (Signed)
Lumbar Epidural Steroid Injection - Interlaminar Approach with Fluoroscopic Guidance  Patient: Jeffrey Brennan      Date of Birth: 1940/04/26 MRN: [id number] PCP: [name], PA      Visit Date: 12/14/2017   Universal Protocol:     Consent Given By: the patient  Position: PRONE  Additional Comments: Vital signs were monitored before and after the procedure. Patient was prepped and draped in the usual sterile fashion. The correct patient, procedure, and site was verified.   Injection Procedure Details:  Procedure Site One Meds Administered:  Meds ordered this encounter  Medications  . methylPREDNISolone acetate (DEPO-MEDROL) injection 80 mg     Laterality: Right  Location/Site:  L4-L5  Needle size: 20 G  Needle type: Tuohy  Needle Placement: Paramedian epidural  Findings:   -Comments: Excellent flow of contrast into the epidural space.  We had a difficult time obtaining entry into the epidural space at L3-4.  This seemed to be a combination of the patient's very flat back in that region and not being able to really position him to get a forward flexion.  We did convert this to an L4-5 injection with good access to the interlaminar epidural space.  We were in direct discussion with the patient during the procedure and he did wish to proceed with that.  Procedure Details: Using a paramedian approach from the side mentioned above, the region overlying the inferior lamina was localized under fluoroscopic visualization and the soft tissues overlying this structure were infiltrated with 4 ml. of 1% Lidocaine without Epinephrine. The Tuohy needle was inserted into the epidural space using a paramedian approach.   The epidural space was localized using loss of resistance along with lateral and bi-planar fluoroscopic views.  After negative aspirate for air, blood, and CSF, a 2 ml. volume of Isovue-250 was injected into the epidural space and the flow of contrast was observed.  Radiographs were obtained for documentation purposes.    The injectate was administered into the level noted above.   Additional Comments:  The patient tolerated the procedure well Dressing: Band-Aid    Post-procedure details: Patient was observed during the procedure. Post-procedure instructions were reviewed.  Patient left the clinic in stable condition.

## 2017-12-28 DIAGNOSIS — K635 Polyp of colon: Secondary | ICD-10-CM | POA: Diagnosis not present

## 2018-01-07 ENCOUNTER — Telehealth (INDEPENDENT_AMBULATORY_CARE_PROVIDER_SITE_OTHER): Payer: Self-pay | Admitting: Physical Medicine and Rehabilitation

## 2018-01-07 NOTE — Telephone Encounter (Signed)
Patient feels that hip pain is worse. He will follow up with Dr. Ninfa Linden first.

## 2018-01-07 NOTE — Telephone Encounter (Signed)
Either one of [place] is ok, he might want to see Dr. Ninfa Linden if he feels more hip pain. If I f/up it would be to again try to nail down pain complaints knowing shot didn't help. Alternative tx would be PT or Chiro or see spine surgeon for eval.

## 2018-01-07 NOTE — Telephone Encounter (Signed)
Left message for patient to call back to discuss/ schedule.

## 2018-01-08 ENCOUNTER — Ambulatory Visit (INDEPENDENT_AMBULATORY_CARE_PROVIDER_SITE_OTHER): Payer: PPO | Admitting: Orthopaedic Surgery

## 2018-01-08 ENCOUNTER — Encounter (INDEPENDENT_AMBULATORY_CARE_PROVIDER_SITE_OTHER): Payer: Self-pay | Admitting: Orthopaedic Surgery

## 2018-01-08 DIAGNOSIS — M7061 Trochanteric bursitis, right hip: Secondary | ICD-10-CM | POA: Diagnosis not present

## 2018-01-08 DIAGNOSIS — M25551 Pain in right hip: Secondary | ICD-10-CM

## 2018-01-08 DIAGNOSIS — Z96641 Presence of right artificial hip joint: Secondary | ICD-10-CM | POA: Insufficient documentation

## 2018-01-08 MED ORDER — LIDOCAINE HCL 1 % IJ SOLN
3.0000 mL | INTRAMUSCULAR | Status: AC | PRN
  Administered 2018-01-08: 3 mL

## 2018-01-08 MED ORDER — METHYLPREDNISOLONE ACETATE 40 MG/ML IJ SUSP
40.0000 mg | INTRAMUSCULAR | Status: AC | PRN
  Administered 2018-01-08: 40 mg via INTRA_ARTICULAR
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
## 2018-01-16 DIAGNOSIS — M2550 Pain in unspecified joint: Secondary | ICD-10-CM | POA: Diagnosis not present

## 2018-01-16 DIAGNOSIS — C9110 Chronic lymphocytic leukemia of B-cell type not having achieved remission: Secondary | ICD-10-CM | POA: Diagnosis not present

## 2018-01-16 DIAGNOSIS — N189 Chronic kidney disease, unspecified: Secondary | ICD-10-CM | POA: Diagnosis not present

## 2018-01-16 DIAGNOSIS — Z888 Allergy status to other drugs, medicaments and biological substances status: Secondary | ICD-10-CM | POA: Diagnosis not present

## 2018-01-16 DIAGNOSIS — K219 Gastro-esophageal reflux disease without esophagitis: Secondary | ICD-10-CM | POA: Diagnosis not present

## 2018-01-16 DIAGNOSIS — D508 Other iron deficiency anemias: Secondary | ICD-10-CM | POA: Diagnosis not present

## 2018-01-16 DIAGNOSIS — Z87891 Personal history of nicotine dependence: Secondary | ICD-10-CM | POA: Diagnosis not present

## 2018-01-16 DIAGNOSIS — I129 Hypertensive chronic kidney disease with stage 1 through stage 4 chronic kidney disease, or unspecified chronic kidney disease: Secondary | ICD-10-CM | POA: Diagnosis not present

## 2018-01-16 DIAGNOSIS — Z7984 Long term (current) use of oral hypoglycemic drugs: Secondary | ICD-10-CM | POA: Diagnosis not present

## 2018-01-16 DIAGNOSIS — Z86010 Personal history of colonic polyps: Secondary | ICD-10-CM | POA: Diagnosis not present

## 2018-01-16 DIAGNOSIS — K635 Polyp of colon: Secondary | ICD-10-CM | POA: Diagnosis not present

## 2018-01-16 DIAGNOSIS — Z23 Encounter for immunization: Secondary | ICD-10-CM | POA: Diagnosis not present

## 2018-01-16 DIAGNOSIS — D500 Iron deficiency anemia secondary to blood loss (chronic): Secondary | ICD-10-CM | POA: Diagnosis not present

## 2018-01-16 DIAGNOSIS — L309 Dermatitis, unspecified: Secondary | ICD-10-CM | POA: Diagnosis not present

## 2018-01-16 DIAGNOSIS — M109 Gout, unspecified: Secondary | ICD-10-CM | POA: Diagnosis not present

## 2018-01-16 DIAGNOSIS — Z96641 Presence of right artificial hip joint: Secondary | ICD-10-CM | POA: Diagnosis not present

## 2018-01-16 DIAGNOSIS — Z79899 Other long term (current) drug therapy: Secondary | ICD-10-CM | POA: Diagnosis not present

## 2018-01-16 DIAGNOSIS — E1122 Type 2 diabetes mellitus with diabetic chronic kidney disease: Secondary | ICD-10-CM | POA: Diagnosis not present

## 2018-02-12 DIAGNOSIS — Z6827 Body mass index (BMI) 27.0-27.9, adult: Secondary | ICD-10-CM | POA: Diagnosis not present

## 2018-02-12 DIAGNOSIS — L03113 Cellulitis of right upper limb: Secondary | ICD-10-CM | POA: Diagnosis not present

## 2018-03-02 DIAGNOSIS — K219 Gastro-esophageal reflux disease without esophagitis: Secondary | ICD-10-CM | POA: Diagnosis not present

## 2018-03-02 DIAGNOSIS — I10 Essential (primary) hypertension: Secondary | ICD-10-CM | POA: Diagnosis not present

## 2018-03-02 DIAGNOSIS — E782 Mixed hyperlipidemia: Secondary | ICD-10-CM | POA: Diagnosis not present

## 2018-03-07 DIAGNOSIS — N183 Chronic kidney disease, stage 3 (moderate): Secondary | ICD-10-CM | POA: Diagnosis not present

## 2018-03-07 DIAGNOSIS — E1122 Type 2 diabetes mellitus with diabetic chronic kidney disease: Secondary | ICD-10-CM | POA: Diagnosis not present

## 2018-03-07 DIAGNOSIS — E611 Iron deficiency: Secondary | ICD-10-CM | POA: Diagnosis not present

## 2018-03-07 DIAGNOSIS — R0602 Shortness of breath: Secondary | ICD-10-CM | POA: Diagnosis not present

## 2018-03-07 DIAGNOSIS — M7061 Trochanteric bursitis, right hip: Secondary | ICD-10-CM | POA: Diagnosis not present

## 2018-03-07 DIAGNOSIS — R600 Localized edema: Secondary | ICD-10-CM | POA: Diagnosis not present

## 2018-03-07 DIAGNOSIS — I129 Hypertensive chronic kidney disease with stage 1 through stage 4 chronic kidney disease, or unspecified chronic kidney disease: Secondary | ICD-10-CM | POA: Diagnosis not present

## 2018-03-07 DIAGNOSIS — N2581 Secondary hyperparathyroidism of renal origin: Secondary | ICD-10-CM | POA: Diagnosis not present

## 2018-03-07 DIAGNOSIS — M109 Gout, unspecified: Secondary | ICD-10-CM | POA: Diagnosis not present

## 2018-03-07 DIAGNOSIS — C9110 Chronic lymphocytic leukemia of B-cell type not having achieved remission: Secondary | ICD-10-CM | POA: Diagnosis not present

## 2018-03-15 DIAGNOSIS — N183 Chronic kidney disease, stage 3 (moderate): Secondary | ICD-10-CM | POA: Diagnosis not present

## 2018-03-15 DIAGNOSIS — Z1389 Encounter for screening for other disorder: Secondary | ICD-10-CM | POA: Diagnosis not present

## 2018-03-15 DIAGNOSIS — M1A00X0 Idiopathic chronic gout, unspecified site, without tophus (tophi): Secondary | ICD-10-CM | POA: Diagnosis not present

## 2018-03-15 DIAGNOSIS — Z6826 Body mass index (BMI) 26.0-26.9, adult: Secondary | ICD-10-CM | POA: Diagnosis not present

## 2018-03-15 DIAGNOSIS — C9191 Lymphoid leukemia, unspecified, in remission: Secondary | ICD-10-CM | POA: Diagnosis not present

## 2018-03-15 DIAGNOSIS — E119 Type 2 diabetes mellitus without complications: Secondary | ICD-10-CM | POA: Diagnosis not present

## 2018-03-15 DIAGNOSIS — Z6827 Body mass index (BMI) 27.0-27.9, adult: Secondary | ICD-10-CM | POA: Diagnosis not present

## 2018-03-15 DIAGNOSIS — G252 Other specified forms of tremor: Secondary | ICD-10-CM | POA: Diagnosis not present

## 2018-03-15 DIAGNOSIS — E782 Mixed hyperlipidemia: Secondary | ICD-10-CM | POA: Diagnosis not present

## 2018-03-15 DIAGNOSIS — I10 Essential (primary) hypertension: Secondary | ICD-10-CM | POA: Diagnosis not present

## 2018-03-15 DIAGNOSIS — J849 Interstitial pulmonary disease, unspecified: Secondary | ICD-10-CM | POA: Diagnosis not present

## 2018-03-15 DIAGNOSIS — K219 Gastro-esophageal reflux disease without esophagitis: Secondary | ICD-10-CM | POA: Diagnosis not present

## 2018-03-15 DIAGNOSIS — Z1331 Encounter for screening for depression: Secondary | ICD-10-CM | POA: Diagnosis not present

## 2018-03-15 DIAGNOSIS — F3342 Major depressive disorder, recurrent, in full remission: Secondary | ICD-10-CM | POA: Diagnosis not present

## 2018-03-20 ENCOUNTER — Encounter (HOSPITAL_COMMUNITY)
Admission: RE | Admit: 2018-03-20 | Discharge: 2018-03-20 | Disposition: A | Discharge status: Home / Self Care | Payer: PPO | Source: Ambulatory Visit | Attending: Nephrology | Admitting: Nephrology

## 2018-03-20 DIAGNOSIS — D631 Anemia in chronic kidney disease: Secondary | ICD-10-CM | POA: Diagnosis not present

## 2018-03-20 DIAGNOSIS — N189 Chronic kidney disease, unspecified: Secondary | ICD-10-CM | POA: Insufficient documentation

## 2018-03-20 MED ORDER — SODIUM CHLORIDE 0.9 % IV SOLN
510.0000 mg | Freq: Once | INTRAVENOUS | Status: AC
  Administered 2018-03-20: 510 mg via INTRAVENOUS
  Filled 2018-03-20: qty 17

## 2018-03-20 MED ORDER — SODIUM CHLORIDE 0.9 % IV SOLN
INTRAVENOUS | Status: DC
  Administered 2018-03-20: 250 mL via INTRAVENOUS

## 2018-03-25 ENCOUNTER — Encounter (HOSPITAL_COMMUNITY)
Admission: RE | Admit: 2018-03-25 | Discharge: 2018-03-25 | Disposition: A | Discharge status: Home / Self Care | Payer: PPO | Source: Ambulatory Visit | Attending: Nephrology | Admitting: Nephrology

## 2018-03-25 ENCOUNTER — Encounter (HOSPITAL_COMMUNITY): Payer: Self-pay

## 2018-03-25 DIAGNOSIS — N189 Chronic kidney disease, unspecified: Secondary | ICD-10-CM | POA: Diagnosis not present

## 2018-03-25 MED ORDER — SODIUM CHLORIDE 0.9 % IV SOLN
510.0000 mg | Freq: Once | INTRAVENOUS | Status: AC
  Administered 2018-03-25: 510 mg via INTRAVENOUS
  Filled 2018-03-25: qty 17

## 2018-03-25 MED ORDER — SODIUM CHLORIDE 0.9 % IV SOLN
Freq: Once | INTRAVENOUS | Status: AC
  Administered 2018-03-25: 12:00:00 via INTRAVENOUS

## 2018-04-10 ENCOUNTER — Ambulatory Visit (INDEPENDENT_AMBULATORY_CARE_PROVIDER_SITE_OTHER): Payer: PPO | Admitting: Orthopaedic Surgery

## 2018-04-10 ENCOUNTER — Encounter (INDEPENDENT_AMBULATORY_CARE_PROVIDER_SITE_OTHER): Payer: Self-pay | Admitting: Orthopaedic Surgery

## 2018-04-10 DIAGNOSIS — J209 Acute bronchitis, unspecified: Secondary | ICD-10-CM | POA: Diagnosis not present

## 2018-04-10 DIAGNOSIS — M7061 Trochanteric bursitis, right hip: Secondary | ICD-10-CM

## 2018-04-10 DIAGNOSIS — Z6826 Body mass index (BMI) 26.0-26.9, adult: Secondary | ICD-10-CM | POA: Diagnosis not present

## 2018-04-10 DIAGNOSIS — D509 Iron deficiency anemia, unspecified: Secondary | ICD-10-CM | POA: Diagnosis not present

## 2018-04-10 MED ORDER — METHYLPREDNISOLONE ACETATE 40 MG/ML IJ SUSP
40.0000 mg | INTRAMUSCULAR | Status: AC | PRN
  Administered 2018-04-10: 40 mg via INTRA_ARTICULAR

## 2018-04-10 MED ORDER — LIDOCAINE HCL 1 % IJ SOLN
3.0000 mL | INTRAMUSCULAR | Status: AC | PRN
  Administered 2018-04-10: 3 mL
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
## 2018-04-17 DIAGNOSIS — Z85828 Personal history of other malignant neoplasm of skin: Secondary | ICD-10-CM | POA: Diagnosis not present

## 2018-04-17 DIAGNOSIS — L570 Actinic keratosis: Secondary | ICD-10-CM | POA: Diagnosis not present

## 2018-04-17 DIAGNOSIS — D485 Neoplasm of uncertain behavior of skin: Secondary | ICD-10-CM | POA: Diagnosis not present

## 2018-04-17 DIAGNOSIS — L309 Dermatitis, unspecified: Secondary | ICD-10-CM | POA: Diagnosis not present

## 2018-04-17 DIAGNOSIS — L821 Other seborrheic keratosis: Secondary | ICD-10-CM | POA: Diagnosis not present

## 2018-04-18 DIAGNOSIS — J209 Acute bronchitis, unspecified: Secondary | ICD-10-CM | POA: Diagnosis not present

## 2018-04-18 DIAGNOSIS — R05 Cough: Secondary | ICD-10-CM | POA: Diagnosis not present

## 2018-04-18 DIAGNOSIS — Z6826 Body mass index (BMI) 26.0-26.9, adult: Secondary | ICD-10-CM | POA: Diagnosis not present

## 2018-04-18 DIAGNOSIS — C9191 Lymphoid leukemia, unspecified, in remission: Secondary | ICD-10-CM | POA: Diagnosis not present

## 2018-04-18 DIAGNOSIS — N183 Chronic kidney disease, stage 3 (moderate): Secondary | ICD-10-CM | POA: Diagnosis not present

## 2018-04-29 DIAGNOSIS — C9190 Lymphoid leukemia, unspecified not having achieved remission: Secondary | ICD-10-CM | POA: Diagnosis not present

## 2018-04-29 DIAGNOSIS — K210 Gastro-esophageal reflux disease with esophagitis: Secondary | ICD-10-CM | POA: Diagnosis not present

## 2018-04-29 DIAGNOSIS — I10 Essential (primary) hypertension: Secondary | ICD-10-CM | POA: Diagnosis not present

## 2018-04-29 DIAGNOSIS — J8410 Pulmonary fibrosis, unspecified: Secondary | ICD-10-CM | POA: Diagnosis not present

## 2018-05-15 DIAGNOSIS — C9110 Chronic lymphocytic leukemia of B-cell type not having achieved remission: Secondary | ICD-10-CM | POA: Diagnosis not present

## 2018-05-17 DIAGNOSIS — I2510 Atherosclerotic heart disease of native coronary artery without angina pectoris: Secondary | ICD-10-CM | POA: Diagnosis not present

## 2018-05-17 DIAGNOSIS — I270 Primary pulmonary hypertension: Secondary | ICD-10-CM | POA: Diagnosis not present

## 2018-05-17 DIAGNOSIS — I700 Atherosclerosis of aorta: Secondary | ICD-10-CM | POA: Diagnosis not present

## 2018-05-17 DIAGNOSIS — J84112 Idiopathic pulmonary fibrosis: Secondary | ICD-10-CM | POA: Diagnosis not present

## 2018-05-17 DIAGNOSIS — R591 Generalized enlarged lymph nodes: Secondary | ICD-10-CM | POA: Diagnosis not present

## 2018-05-17 DIAGNOSIS — I77810 Thoracic aortic ectasia: Secondary | ICD-10-CM | POA: Diagnosis not present

## 2018-05-22 DIAGNOSIS — C9110 Chronic lymphocytic leukemia of B-cell type not having achieved remission: Secondary | ICD-10-CM | POA: Diagnosis not present

## 2018-05-22 DIAGNOSIS — Z86010 Personal history of colonic polyps: Secondary | ICD-10-CM | POA: Diagnosis not present

## 2018-05-22 DIAGNOSIS — D500 Iron deficiency anemia secondary to blood loss (chronic): Secondary | ICD-10-CM | POA: Diagnosis not present

## 2018-05-31 DIAGNOSIS — D508 Other iron deficiency anemias: Secondary | ICD-10-CM | POA: Diagnosis not present

## 2018-05-31 DIAGNOSIS — E1165 Type 2 diabetes mellitus with hyperglycemia: Secondary | ICD-10-CM | POA: Diagnosis not present

## 2018-06-03 DIAGNOSIS — M189 Osteoarthritis of first carpometacarpal joint, unspecified: Secondary | ICD-10-CM | POA: Diagnosis not present

## 2018-06-03 DIAGNOSIS — I10 Essential (primary) hypertension: Secondary | ICD-10-CM | POA: Diagnosis not present

## 2018-06-03 DIAGNOSIS — J8410 Pulmonary fibrosis, unspecified: Secondary | ICD-10-CM | POA: Diagnosis not present

## 2018-06-03 DIAGNOSIS — E119 Type 2 diabetes mellitus without complications: Secondary | ICD-10-CM | POA: Diagnosis not present

## 2018-06-18 DIAGNOSIS — J8410 Pulmonary fibrosis, unspecified: Secondary | ICD-10-CM | POA: Diagnosis not present

## 2018-07-01 DIAGNOSIS — I10 Essential (primary) hypertension: Secondary | ICD-10-CM | POA: Diagnosis not present

## 2018-07-01 DIAGNOSIS — E782 Mixed hyperlipidemia: Secondary | ICD-10-CM | POA: Diagnosis not present

## 2018-07-01 DIAGNOSIS — E1122 Type 2 diabetes mellitus with diabetic chronic kidney disease: Secondary | ICD-10-CM | POA: Diagnosis not present

## 2018-07-03 DIAGNOSIS — Z85828 Personal history of other malignant neoplasm of skin: Secondary | ICD-10-CM | POA: Diagnosis not present

## 2018-07-03 DIAGNOSIS — D400 Neoplasm of uncertain behavior of prostate: Secondary | ICD-10-CM | POA: Diagnosis not present

## 2018-07-03 DIAGNOSIS — Z1231 Encounter for screening mammogram for malignant neoplasm of breast: Secondary | ICD-10-CM | POA: Diagnosis not present

## 2018-07-03 DIAGNOSIS — C61 Malignant neoplasm of prostate: Secondary | ICD-10-CM | POA: Diagnosis not present

## 2018-07-03 DIAGNOSIS — N5201 Erectile dysfunction due to arterial insufficiency: Secondary | ICD-10-CM | POA: Diagnosis not present

## 2018-07-03 DIAGNOSIS — L300 Nummular dermatitis: Secondary | ICD-10-CM | POA: Diagnosis not present

## 2018-07-03 DIAGNOSIS — D2272 Melanocytic nevi of left lower limb, including hip: Secondary | ICD-10-CM | POA: Diagnosis not present

## 2018-07-03 DIAGNOSIS — R972 Elevated prostate specific antigen [PSA]: Secondary | ICD-10-CM | POA: Diagnosis not present

## 2018-07-03 DIAGNOSIS — D485 Neoplasm of uncertain behavior of skin: Secondary | ICD-10-CM | POA: Diagnosis not present

## 2018-07-06 DIAGNOSIS — E785 Hyperlipidemia, unspecified: Secondary | ICD-10-CM | POA: Diagnosis not present

## 2018-07-06 DIAGNOSIS — I10 Essential (primary) hypertension: Secondary | ICD-10-CM | POA: Diagnosis not present

## 2018-07-06 DIAGNOSIS — R040 Epistaxis: Secondary | ICD-10-CM | POA: Diagnosis not present

## 2018-07-06 DIAGNOSIS — Z6826 Body mass index (BMI) 26.0-26.9, adult: Secondary | ICD-10-CM | POA: Diagnosis not present

## 2018-07-06 DIAGNOSIS — R001 Bradycardia, unspecified: Secondary | ICD-10-CM | POA: Diagnosis not present

## 2018-07-06 DIAGNOSIS — N3289 Other specified disorders of bladder: Secondary | ICD-10-CM | POA: Diagnosis not present

## 2018-07-06 DIAGNOSIS — N401 Enlarged prostate with lower urinary tract symptoms: Secondary | ICD-10-CM | POA: Diagnosis not present

## 2018-07-08 DIAGNOSIS — K219 Gastro-esophageal reflux disease without esophagitis: Secondary | ICD-10-CM | POA: Diagnosis not present

## 2018-07-08 DIAGNOSIS — E1169 Type 2 diabetes mellitus with other specified complication: Secondary | ICD-10-CM | POA: Diagnosis not present

## 2018-07-08 DIAGNOSIS — Z8639 Personal history of other endocrine, nutritional and metabolic disease: Secondary | ICD-10-CM | POA: Diagnosis not present

## 2018-07-08 DIAGNOSIS — F17200 Nicotine dependence, unspecified, uncomplicated: Secondary | ICD-10-CM | POA: Diagnosis not present

## 2018-07-08 DIAGNOSIS — I739 Peripheral vascular disease, unspecified: Secondary | ICD-10-CM | POA: Diagnosis not present

## 2018-07-08 DIAGNOSIS — Z23 Encounter for immunization: Secondary | ICD-10-CM | POA: Diagnosis not present

## 2018-07-08 DIAGNOSIS — J449 Chronic obstructive pulmonary disease, unspecified: Secondary | ICD-10-CM | POA: Diagnosis not present

## 2018-07-08 DIAGNOSIS — M25611 Stiffness of right shoulder, not elsewhere classified: Secondary | ICD-10-CM | POA: Diagnosis not present

## 2018-07-08 DIAGNOSIS — R040 Epistaxis: Secondary | ICD-10-CM | POA: Diagnosis not present

## 2018-07-08 DIAGNOSIS — R809 Proteinuria, unspecified: Secondary | ICD-10-CM | POA: Diagnosis not present

## 2018-07-08 DIAGNOSIS — R944 Abnormal results of kidney function studies: Secondary | ICD-10-CM | POA: Diagnosis not present

## 2018-07-08 DIAGNOSIS — I129 Hypertensive chronic kidney disease with stage 1 through stage 4 chronic kidney disease, or unspecified chronic kidney disease: Secondary | ICD-10-CM | POA: Diagnosis not present

## 2018-07-08 DIAGNOSIS — Z79899 Other long term (current) drug therapy: Secondary | ICD-10-CM | POA: Diagnosis not present

## 2018-07-08 DIAGNOSIS — R011 Cardiac murmur, unspecified: Secondary | ICD-10-CM | POA: Diagnosis not present

## 2018-07-08 DIAGNOSIS — S0990XA Unspecified injury of head, initial encounter: Secondary | ICD-10-CM | POA: Diagnosis not present

## 2018-07-08 DIAGNOSIS — M6281 Muscle weakness (generalized): Secondary | ICD-10-CM | POA: Diagnosis not present

## 2018-07-08 DIAGNOSIS — M1712 Unilateral primary osteoarthritis, left knee: Secondary | ICD-10-CM | POA: Diagnosis not present

## 2018-07-08 DIAGNOSIS — N189 Chronic kidney disease, unspecified: Secondary | ICD-10-CM | POA: Diagnosis not present

## 2018-07-08 DIAGNOSIS — M86379 Chronic multifocal osteomyelitis, unspecified ankle and foot: Secondary | ICD-10-CM | POA: Diagnosis not present

## 2018-07-08 DIAGNOSIS — Z7984 Long term (current) use of oral hypoglycemic drugs: Secondary | ICD-10-CM | POA: Diagnosis not present

## 2018-07-08 DIAGNOSIS — I6529 Occlusion and stenosis of unspecified carotid artery: Secondary | ICD-10-CM | POA: Diagnosis not present

## 2018-07-08 DIAGNOSIS — K5190 Ulcerative colitis, unspecified, without complications: Secondary | ICD-10-CM | POA: Diagnosis not present

## 2018-07-08 DIAGNOSIS — S01112A Laceration without foreign body of left eyelid and periocular area, initial encounter: Secondary | ICD-10-CM | POA: Diagnosis not present

## 2018-07-08 DIAGNOSIS — K760 Fatty (change of) liver, not elsewhere classified: Secondary | ICD-10-CM | POA: Diagnosis not present

## 2018-07-08 DIAGNOSIS — M25511 Pain in right shoulder: Secondary | ICD-10-CM | POA: Diagnosis not present

## 2018-07-08 DIAGNOSIS — E782 Mixed hyperlipidemia: Secondary | ICD-10-CM | POA: Diagnosis not present

## 2018-07-08 DIAGNOSIS — I10 Essential (primary) hypertension: Secondary | ICD-10-CM | POA: Diagnosis not present

## 2018-07-08 DIAGNOSIS — E1122 Type 2 diabetes mellitus with diabetic chronic kidney disease: Secondary | ICD-10-CM | POA: Diagnosis not present

## 2018-07-09 DIAGNOSIS — R040 Epistaxis: Secondary | ICD-10-CM | POA: Diagnosis not present

## 2018-07-09 DIAGNOSIS — Z8639 Personal history of other endocrine, nutritional and metabolic disease: Secondary | ICD-10-CM | POA: Diagnosis not present

## 2018-07-09 DIAGNOSIS — F17200 Nicotine dependence, unspecified, uncomplicated: Secondary | ICD-10-CM | POA: Diagnosis not present

## 2018-07-09 DIAGNOSIS — N189 Chronic kidney disease, unspecified: Secondary | ICD-10-CM | POA: Diagnosis not present

## 2018-07-09 DIAGNOSIS — I10 Essential (primary) hypertension: Secondary | ICD-10-CM | POA: Diagnosis not present

## 2018-07-09 DIAGNOSIS — E1122 Type 2 diabetes mellitus with diabetic chronic kidney disease: Secondary | ICD-10-CM | POA: Diagnosis not present

## 2018-07-09 DIAGNOSIS — E559 Vitamin D deficiency, unspecified: Secondary | ICD-10-CM | POA: Diagnosis not present

## 2018-07-09 DIAGNOSIS — D510 Vitamin B12 deficiency anemia due to intrinsic factor deficiency: Secondary | ICD-10-CM | POA: Diagnosis not present

## 2018-07-09 DIAGNOSIS — K219 Gastro-esophageal reflux disease without esophagitis: Secondary | ICD-10-CM | POA: Diagnosis not present

## 2018-07-09 DIAGNOSIS — J449 Chronic obstructive pulmonary disease, unspecified: Secondary | ICD-10-CM | POA: Diagnosis not present

## 2018-07-09 DIAGNOSIS — I129 Hypertensive chronic kidney disease with stage 1 through stage 4 chronic kidney disease, or unspecified chronic kidney disease: Secondary | ICD-10-CM | POA: Diagnosis not present

## 2018-07-09 DIAGNOSIS — E781 Pure hyperglyceridemia: Secondary | ICD-10-CM | POA: Diagnosis not present

## 2018-07-09 DIAGNOSIS — R42 Dizziness and giddiness: Secondary | ICD-10-CM | POA: Diagnosis not present

## 2018-07-09 DIAGNOSIS — E039 Hypothyroidism, unspecified: Secondary | ICD-10-CM | POA: Diagnosis not present

## 2018-07-09 DIAGNOSIS — K7581 Nonalcoholic steatohepatitis (NASH): Secondary | ICD-10-CM | POA: Diagnosis not present

## 2018-07-09 DIAGNOSIS — R079 Chest pain, unspecified: Secondary | ICD-10-CM | POA: Diagnosis not present

## 2018-07-09 DIAGNOSIS — Z7984 Long term (current) use of oral hypoglycemic drugs: Secondary | ICD-10-CM | POA: Diagnosis not present

## 2018-07-10 ENCOUNTER — Other Ambulatory Visit: Payer: Self-pay | Admitting: *Deleted

## 2018-07-10 NOTE — Patient Outreach (Signed)
[facility]) Care Management  07/10/2018  OBERON Brennan 08/09/1940 [id number]   Telephone Screen  Referral Date:07/08/18  Referral Source: Nurse call center Referral Reason: nose bleed - was seen at ER with nosebleed and has clear mucus coming out and has blood as well from packing No active bleeding To have packng removed 07/09/18 am  PMH HTN, COPD Referred to [facility] in 3 days per Selmer Dominion, RN  Insurance: HTA   Outreach attempt # 1 Patient is able to verify HIPAA Reviewed and addressed referral to [facility] with patient   Mr Jeffrey Brennan denies further frank bleeding and minimal clear mucus He reports the s/s are improving  Mr Jeffrey Brennan reports he went back to have the packing removes as initially advised by first ED MD but a second ED MD recommended the packing stay in for general 2-3 days He reports the packing is scheduled to be removed on 07/11/18 instead He states he has a tube in He confirms hx of allergies with use of saline spray prior to use of flonase but still had nasal bleeding concerns. He also states this may be related to a  deviated nasal septum  CM answered questions about the packing removal related to his concern that removing the packing may cause the nasal bleeding to restart     Social:  Mr Jeffrey Brennan denies issues with support system, transportation and his care needs   Conditions: DM CLL, Allergies, GERD, gout, bilateral leg edema, colon polyps HTN, former smoker, chronic renal insufficiency   Medications: denies concerns with taking medications as prescribed, affording medications, side effects of medications and questions about medications    Appointments: 11/14/18 Dr Federico Flake Hematology    Advance Directives: Denies need for assist with advance directives    Consent: [facility] RN CM reviewed [facility] services with patient. Patient gave verbal consent for services [facility] telephonic RN CM.   Plan: [facility] RN CM will  Kimberly L. Lavina Hamman, RN, BSN, [place] Coordinator Office number [phone] Mobile number [phone]  Main [facility] number [phone] Fax number [phone]

## 2018-07-11 DIAGNOSIS — C9191 Lymphoid leukemia, unspecified, in remission: Secondary | ICD-10-CM | POA: Diagnosis not present

## 2018-07-11 DIAGNOSIS — N183 Chronic kidney disease, stage 3 (moderate): Secondary | ICD-10-CM | POA: Diagnosis not present

## 2018-07-11 DIAGNOSIS — R040 Epistaxis: Secondary | ICD-10-CM | POA: Diagnosis not present

## 2018-07-11 DIAGNOSIS — F3342 Major depressive disorder, recurrent, in full remission: Secondary | ICD-10-CM | POA: Diagnosis not present

## 2018-07-11 DIAGNOSIS — M1A00X0 Idiopathic chronic gout, unspecified site, without tophus (tophi): Secondary | ICD-10-CM | POA: Diagnosis not present

## 2018-07-11 DIAGNOSIS — Z6825 Body mass index (BMI) 25.0-25.9, adult: Secondary | ICD-10-CM | POA: Diagnosis not present

## 2018-07-11 DIAGNOSIS — I10 Essential (primary) hypertension: Secondary | ICD-10-CM | POA: Diagnosis not present

## 2018-07-11 DIAGNOSIS — E119 Type 2 diabetes mellitus without complications: Secondary | ICD-10-CM | POA: Diagnosis not present

## 2018-07-11 DIAGNOSIS — S72402A Unspecified fracture of lower end of left femur, initial encounter for closed fracture: Secondary | ICD-10-CM | POA: Diagnosis not present

## 2018-07-11 DIAGNOSIS — E782 Mixed hyperlipidemia: Secondary | ICD-10-CM | POA: Diagnosis not present

## 2018-07-11 DIAGNOSIS — J449 Chronic obstructive pulmonary disease, unspecified: Secondary | ICD-10-CM | POA: Diagnosis not present

## 2018-07-11 DIAGNOSIS — K219 Gastro-esophageal reflux disease without esophagitis: Secondary | ICD-10-CM | POA: Diagnosis not present

## 2018-08-19 DIAGNOSIS — C9190 Lymphoid leukemia, unspecified not having achieved remission: Secondary | ICD-10-CM | POA: Diagnosis not present

## 2018-08-19 DIAGNOSIS — I10 Essential (primary) hypertension: Secondary | ICD-10-CM | POA: Diagnosis not present

## 2018-08-19 DIAGNOSIS — J849 Interstitial pulmonary disease, unspecified: Secondary | ICD-10-CM | POA: Diagnosis not present

## 2018-08-19 DIAGNOSIS — E119 Type 2 diabetes mellitus without complications: Secondary | ICD-10-CM | POA: Diagnosis not present

## 2018-08-30 DIAGNOSIS — Z6825 Body mass index (BMI) 25.0-25.9, adult: Secondary | ICD-10-CM | POA: Diagnosis not present

## 2018-08-30 DIAGNOSIS — R21 Rash and other nonspecific skin eruption: Secondary | ICD-10-CM | POA: Diagnosis not present

## 2018-10-01 DIAGNOSIS — I10 Essential (primary) hypertension: Secondary | ICD-10-CM | POA: Diagnosis not present

## 2018-10-01 DIAGNOSIS — E782 Mixed hyperlipidemia: Secondary | ICD-10-CM | POA: Diagnosis not present

## 2018-10-16 DIAGNOSIS — D225 Melanocytic nevi of trunk: Secondary | ICD-10-CM | POA: Diagnosis not present

## 2018-10-16 DIAGNOSIS — Z85828 Personal history of other malignant neoplasm of skin: Secondary | ICD-10-CM | POA: Diagnosis not present

## 2018-10-16 DIAGNOSIS — D3617 Benign neoplasm of peripheral nerves and autonomic nervous system of trunk, unspecified: Secondary | ICD-10-CM | POA: Diagnosis not present

## 2018-10-16 DIAGNOSIS — D2261 Melanocytic nevi of right upper limb, including shoulder: Secondary | ICD-10-CM | POA: Diagnosis not present

## 2018-10-16 DIAGNOSIS — L821 Other seborrheic keratosis: Secondary | ICD-10-CM | POA: Diagnosis not present

## 2018-10-16 DIAGNOSIS — D235 Other benign neoplasm of skin of trunk: Secondary | ICD-10-CM | POA: Diagnosis not present

## 2018-10-16 DIAGNOSIS — L570 Actinic keratosis: Secondary | ICD-10-CM | POA: Diagnosis not present

## 2018-11-01 DIAGNOSIS — E7800 Pure hypercholesterolemia, unspecified: Secondary | ICD-10-CM | POA: Diagnosis not present

## 2018-11-01 DIAGNOSIS — I10 Essential (primary) hypertension: Secondary | ICD-10-CM | POA: Diagnosis not present

## 2018-11-07 DIAGNOSIS — C9191 Lymphoid leukemia, unspecified, in remission: Secondary | ICD-10-CM | POA: Diagnosis not present

## 2018-11-07 DIAGNOSIS — N183 Chronic kidney disease, stage 3 (moderate): Secondary | ICD-10-CM | POA: Diagnosis not present

## 2018-11-07 DIAGNOSIS — E782 Mixed hyperlipidemia: Secondary | ICD-10-CM | POA: Diagnosis not present

## 2018-11-07 DIAGNOSIS — I10 Essential (primary) hypertension: Secondary | ICD-10-CM | POA: Diagnosis not present

## 2018-11-07 DIAGNOSIS — Z0001 Encounter for general adult medical examination with abnormal findings: Secondary | ICD-10-CM | POA: Diagnosis not present

## 2018-11-07 DIAGNOSIS — E119 Type 2 diabetes mellitus without complications: Secondary | ICD-10-CM | POA: Diagnosis not present

## 2018-11-07 DIAGNOSIS — M1A00X0 Idiopathic chronic gout, unspecified site, without tophus (tophi): Secondary | ICD-10-CM | POA: Diagnosis not present

## 2018-11-07 DIAGNOSIS — Z6825 Body mass index (BMI) 25.0-25.9, adult: Secondary | ICD-10-CM | POA: Diagnosis not present

## 2018-11-13 DIAGNOSIS — C9110 Chronic lymphocytic leukemia of B-cell type not having achieved remission: Secondary | ICD-10-CM | POA: Diagnosis not present

## 2018-11-18 DIAGNOSIS — C9191 Lymphoid leukemia, unspecified, in remission: Secondary | ICD-10-CM | POA: Diagnosis not present

## 2018-11-18 DIAGNOSIS — I10 Essential (primary) hypertension: Secondary | ICD-10-CM | POA: Diagnosis not present

## 2018-11-18 DIAGNOSIS — N183 Chronic kidney disease, stage 3 (moderate): Secondary | ICD-10-CM | POA: Diagnosis not present

## 2018-11-18 DIAGNOSIS — E782 Mixed hyperlipidemia: Secondary | ICD-10-CM | POA: Diagnosis not present

## 2018-11-18 DIAGNOSIS — E119 Type 2 diabetes mellitus without complications: Secondary | ICD-10-CM | POA: Diagnosis not present

## 2018-11-18 DIAGNOSIS — J441 Chronic obstructive pulmonary disease with (acute) exacerbation: Secondary | ICD-10-CM | POA: Diagnosis not present

## 2018-11-20 DIAGNOSIS — C9110 Chronic lymphocytic leukemia of B-cell type not having achieved remission: Secondary | ICD-10-CM | POA: Diagnosis not present

## 2018-11-20 DIAGNOSIS — D508 Other iron deficiency anemias: Secondary | ICD-10-CM | POA: Diagnosis not present

## 2018-11-22 DIAGNOSIS — Z23 Encounter for immunization: Secondary | ICD-10-CM | POA: Diagnosis not present

## 2018-12-18 DIAGNOSIS — E611 Iron deficiency: Secondary | ICD-10-CM | POA: Diagnosis not present

## 2018-12-18 DIAGNOSIS — J849 Interstitial pulmonary disease, unspecified: Secondary | ICD-10-CM | POA: Diagnosis not present

## 2018-12-18 DIAGNOSIS — C9110 Chronic lymphocytic leukemia of B-cell type not having achieved remission: Secondary | ICD-10-CM | POA: Diagnosis not present

## 2018-12-18 DIAGNOSIS — M109 Gout, unspecified: Secondary | ICD-10-CM | POA: Diagnosis not present

## 2018-12-18 DIAGNOSIS — N2581 Secondary hyperparathyroidism of renal origin: Secondary | ICD-10-CM | POA: Diagnosis not present

## 2018-12-18 DIAGNOSIS — I129 Hypertensive chronic kidney disease with stage 1 through stage 4 chronic kidney disease, or unspecified chronic kidney disease: Secondary | ICD-10-CM | POA: Diagnosis not present

## 2018-12-18 DIAGNOSIS — R600 Localized edema: Secondary | ICD-10-CM | POA: Diagnosis not present

## 2018-12-18 DIAGNOSIS — N183 Chronic kidney disease, stage 3 (moderate): Secondary | ICD-10-CM | POA: Diagnosis not present

## 2018-12-18 DIAGNOSIS — E1122 Type 2 diabetes mellitus with diabetic chronic kidney disease: Secondary | ICD-10-CM | POA: Diagnosis not present

## 2019-01-17 DIAGNOSIS — Z23 Encounter for immunization: Secondary | ICD-10-CM | POA: Diagnosis not present

## 2019-02-24 ENCOUNTER — Encounter: Payer: Self-pay | Admitting: Physician Assistant

## 2019-02-24 ENCOUNTER — Ambulatory Visit (INDEPENDENT_AMBULATORY_CARE_PROVIDER_SITE_OTHER): Payer: PPO | Admitting: Physician Assistant

## 2019-02-24 ENCOUNTER — Other Ambulatory Visit: Payer: Self-pay

## 2019-02-24 DIAGNOSIS — M7061 Trochanteric bursitis, right hip: Secondary | ICD-10-CM | POA: Diagnosis not present

## 2019-02-24 MED ORDER — METHYLPREDNISOLONE ACETATE 40 MG/ML IJ SUSP
40.0000 mg | INTRAMUSCULAR | Status: AC | PRN
  Administered 2019-02-24: 10:00:00 40 mg via INTRA_ARTICULAR

## 2019-02-24 MED ORDER — LIDOCAINE HCL 1 % IJ SOLN
3.0000 mL | INTRAMUSCULAR | Status: AC | PRN
  Administered 2019-02-24: 3 mL

## 2019-02-24 NOTE — Progress Notes (Signed)
   Procedure Note  Patient: Jeffrey Brennan             Date of Birth: [birth year]           MRN: [id number]             Visit Date: 02/24/2019   HPI: Mr. Jeffrey Brennan comes in today requesting a hip injection.  Last received a trochanteric injection in January and did well until recently.  Reports that he was working on some plants with a friend and had some pain after that.  No particular injury.  No numbness tingling down the leg.  Physical exam: Right hip good range of motion without significant pain and some discomfort with external rotation.  Tenderness over the right trochanteric region. Procedures: Visit Diagnoses:  1. Trochanteric bursitis, right hip     Large Joint Inj: R greater trochanter on 02/24/2019 10:27 AM Indications: pain Details: 22 G 1.5 in needle, lateral approach  Arthrogram: No  Medications: 3 mL lidocaine 1 %; 40 mg methylPREDNISolone acetate 40 MG/ML Outcome: tolerated well, no immediate complications Procedure, treatment alternatives, risks and benefits explained, specific risks discussed. Consent was given by the patient. Immediately prior to procedure a time out was called to verify the correct patient, procedure, equipment, support staff and site/side marked as required. Patient was prepped and draped in the usual sterile fashion.     Plan: He will work on IT band stretching exercises as shown.  Follow-up with [place] as needed.  Questions encouraged and answered.

## 2019-03-05 DIAGNOSIS — C9191 Lymphoid leukemia, unspecified, in remission: Secondary | ICD-10-CM | POA: Diagnosis not present

## 2019-03-05 DIAGNOSIS — I10 Essential (primary) hypertension: Secondary | ICD-10-CM | POA: Diagnosis not present

## 2019-03-05 DIAGNOSIS — E119 Type 2 diabetes mellitus without complications: Secondary | ICD-10-CM | POA: Diagnosis not present

## 2019-03-05 DIAGNOSIS — G2581 Restless legs syndrome: Secondary | ICD-10-CM | POA: Diagnosis not present

## 2019-03-05 DIAGNOSIS — K219 Gastro-esophageal reflux disease without esophagitis: Secondary | ICD-10-CM | POA: Diagnosis not present

## 2019-03-05 DIAGNOSIS — N183 Chronic kidney disease, stage 3 unspecified: Secondary | ICD-10-CM | POA: Diagnosis not present

## 2019-03-05 DIAGNOSIS — M1A00X0 Idiopathic chronic gout, unspecified site, without tophus (tophi): Secondary | ICD-10-CM | POA: Diagnosis not present

## 2019-03-05 DIAGNOSIS — Z6825 Body mass index (BMI) 25.0-25.9, adult: Secondary | ICD-10-CM | POA: Diagnosis not present

## 2019-03-19 ENCOUNTER — Encounter: Payer: Self-pay | Admitting: Pulmonary Disease

## 2019-03-19 ENCOUNTER — Ambulatory Visit: Payer: PPO | Admitting: Pulmonary Disease

## 2019-03-19 ENCOUNTER — Telehealth: Payer: Self-pay | Admitting: Pulmonary Disease

## 2019-03-19 ENCOUNTER — Other Ambulatory Visit: Payer: Self-pay

## 2019-03-19 ENCOUNTER — Other Ambulatory Visit: Payer: PPO

## 2019-03-19 VITALS — BP 122/80 | HR 90 | Temp 98.2°F | Ht 69.0 in | Wt 180.0 lb

## 2019-03-19 DIAGNOSIS — J849 Interstitial pulmonary disease, unspecified: Secondary | ICD-10-CM | POA: Diagnosis not present

## 2019-03-19 DIAGNOSIS — D729 Disorder of white blood cells, unspecified: Secondary | ICD-10-CM

## 2019-03-19 LAB — CBC WITH DIFFERENTIAL/PLATELET
Basophils Absolute: 0.1 10*3/uL (ref 0.0–0.1)
Basophils Relative: 0.3 % (ref 0.0–3.0)
Eosinophils Absolute: 0.4 10*3/uL (ref 0.0–0.7)
Eosinophils Relative: 1.9 % (ref 0.0–5.0)
HCT: 39.5 % (ref 39.0–52.0)
Hemoglobin: 12.6 g/dL — ABNORMAL LOW (ref 13.0–17.0)
Lymphocytes Relative: 68.9 % — ABNORMAL HIGH (ref 12.0–46.0)
Lymphs Abs: 15.5 10*3/uL — ABNORMAL HIGH (ref 0.7–4.0)
MCHC: 31.9 g/dL (ref 30.0–36.0)
MCV: 87.8 fl (ref 78.0–100.0)
Monocytes Absolute: 0.6 10*3/uL (ref 0.1–1.0)
Monocytes Relative: 2.4 % — ABNORMAL LOW (ref 3.0–12.0)
Neutro Abs: 6 10*3/uL (ref 1.4–7.7)
Neutrophils Relative %: 26.5 % — ABNORMAL LOW (ref 43.0–77.0)
Platelets: 215 10*3/uL (ref 150.0–400.0)
RBC: 4.5 Mil/uL (ref 4.22–5.81)
RDW: 16.4 % — ABNORMAL HIGH (ref 11.5–15.5)
WBC: 22.5 10*3/uL (ref 4.0–10.5)

## 2019-03-19 LAB — COMPREHENSIVE METABOLIC PANEL
ALT: 14 U/L (ref 0–53)
AST: 17 U/L (ref 0–37)
Albumin: 4 g/dL (ref 3.5–5.2)
Alkaline Phosphatase: 80 U/L (ref 39–117)
BUN: 39 mg/dL — ABNORMAL HIGH (ref 6–23)
CO2: 25 mEq/L (ref 19–32)
Calcium: 9.5 mg/dL (ref 8.4–10.5)
Chloride: 101 mEq/L (ref 96–112)
Creatinine, Ser: 1.71 mg/dL — ABNORMAL HIGH (ref 0.40–1.50)
GFR: 38.94 mL/min — ABNORMAL LOW (ref >60.00)
Glucose, Bld: 178 mg/dL — ABNORMAL HIGH (ref 70–99)
Potassium: 3.7 mEq/L (ref 3.5–5.1)
Sodium: 135 mEq/L (ref 135–145)
Total Bilirubin: 0.5 mg/dL (ref 0.2–1.2)
Total Protein: 6.4 g/dL (ref 6.0–8.3)

## 2019-03-19 NOTE — Patient Instructions (Addendum)
Please start Ofev that has already been prescribed We will check some labs today for baseline evaluation We will get in touch with pharmacy for management of medication Schedule PFTs, 6-minute walk test Get records from Dr. Luan Pulling   Follow-up in 4 weeks

## 2019-03-19 NOTE — Telephone Encounter (Signed)
Critical lab result received from Santiago Glad at [facility] lab: WBC 22.5  Patient does have CLL per epic and is under the care of [facility] Dr. Federico Flake.  Routing to Dr Vaughan Browner.
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
## 2019-03-20 LAB — PATHOLOGIST SMEAR REVIEW

## 2019-03-23 LAB — ANTI-SCLERODERMA ANTIBODY: Scleroderma (Scl-70) (ENA) Antibody, IgG: <1 AI

## 2019-03-23 LAB — ANTI-NUCLEAR AB-TITER (ANA TITER): ANA Titer 1: 1:80 {titer} — ABNORMAL HIGH

## 2019-03-23 LAB — ANA,IFA RA DIAG PNL W/RFLX TIT/PATN
Anti Nuclear Antibody (ANA): POSITIVE — AB
Cyclic Citrullin Peptide Ab: <16 UNITS
Rheumatoid fact SerPl-aCnc: <14 IU/mL (ref <14)

## 2019-03-23 LAB — SJOGREN'S SYNDROME ANTIBODS(SSA + SSB)
SSA (Ro) (ENA) Antibody, IgG: <1 AI
SSB (La) (ENA) Antibody, IgG: <1 AI

## 2019-03-23 LAB — ANCA SCREEN W REFLEX TITER: ANCA Screen: NEGATIVE

## 2019-03-24 LAB — HYPERSENSITIVITY PNEUMONITIS
A. Pullulans Abs: NEGATIVE
A.Fumigatus #1 Abs: NEGATIVE
Micropolyspora faeni, IgG: NEGATIVE
Pigeon Serum Abs: NEGATIVE
Thermoact. Saccharii: NEGATIVE
Thermoactinomyces vulgaris, IgG: NEGATIVE

## 2019-03-30 LAB — MYOMARKER 3 PLUS PROFILE (RDL)

## 2019-04-15 ENCOUNTER — Telehealth: Payer: Self-pay | Admitting: Pulmonary Disease

## 2019-04-15 NOTE — Telephone Encounter (Signed)
Called and spoke with pt letting him know that we are advising our patients to get the covid vaccine when available to them. Pt verbalized understanding.nothing further needed.

## 2019-05-09 ENCOUNTER — Telehealth: Payer: Self-pay | Admitting: Pulmonary Disease

## 2019-05-09 NOTE — Telephone Encounter (Signed)
Unsure if this is directly coming from patient's antifibrotic/Ofev.  With that said its not unreasonable for [place] to consider doing a "drug holiday" from the Ofev.  I would recommend the patient stop taking Ofev for 7 days total.  Patient can be scheduled for follow-up with [facility] or Dr. Vaughan Browner in 7 to 10 days to further evaluate how they are doing off of the medication.  As always if the patient would like to have a virtual visit sooner than that they are more than welcome.Wyn Quaker, FNP

## 2019-05-09 NOTE — Telephone Encounter (Signed)
Spoke with pt, states that he believes Ofev is causing him to have joint and back pain.  Pt has tried cutting down to 1 tab daily which is not helping with s/s.  Pt taking Tylenol daily to help with side effects, but states this is effecting his daily movement.   S/s started approx 1 week ago, pt has been on Ofev X approx 1 month.    Pt has already taken 1 tab today of Ofev.    Sending to [place] since Dr. Vaughan Browner is not here.  Please advise on additonal recs.  Thanks!

## 2019-05-09 NOTE — Telephone Encounter (Signed)
Spoke with pt, aware of recs. Pt has a 72m on 2/18 and is seeing Dr. MVaughan Browneron 2/25, wishes to call [place] week to keep [place] and keep his follow up as already scheduled.  Nothing further needed at this time- will close encounter.

## 2019-05-15 DIAGNOSIS — C9110 Chronic lymphocytic leukemia of B-cell type not having achieved remission: Secondary | ICD-10-CM | POA: Diagnosis not present

## 2019-05-22 ENCOUNTER — Ambulatory Visit: Payer: PPO

## 2019-05-23 DIAGNOSIS — C9110 Chronic lymphocytic leukemia of B-cell type not having achieved remission: Secondary | ICD-10-CM | POA: Diagnosis not present

## 2019-05-24 DIAGNOSIS — J019 Acute sinusitis, unspecified: Secondary | ICD-10-CM | POA: Diagnosis not present

## 2019-05-26 ENCOUNTER — Other Ambulatory Visit: Payer: Self-pay

## 2019-05-26 ENCOUNTER — Other Ambulatory Visit (HOSPITAL_COMMUNITY)
Admission: RE | Admit: 2019-05-26 | Discharge: 2019-05-26 | Disposition: A | Discharge status: Home / Self Care | Payer: PPO | Source: Ambulatory Visit | Attending: Pulmonary Disease | Admitting: Pulmonary Disease

## 2019-05-26 DIAGNOSIS — Z01812 Encounter for preprocedural laboratory examination: Secondary | ICD-10-CM | POA: Diagnosis not present

## 2019-05-26 DIAGNOSIS — Z20822 Contact with and (suspected) exposure to covid-19: Secondary | ICD-10-CM | POA: Diagnosis not present

## 2019-05-26 LAB — SARS CORONAVIRUS 2 (TAT 6-24 HRS): SARS Coronavirus 2: NEGATIVE

## 2019-05-29 ENCOUNTER — Ambulatory Visit (INDEPENDENT_AMBULATORY_CARE_PROVIDER_SITE_OTHER): Payer: PPO | Admitting: Pulmonary Disease

## 2019-05-29 ENCOUNTER — Encounter: Payer: Self-pay | Admitting: Pulmonary Disease

## 2019-05-29 ENCOUNTER — Other Ambulatory Visit: Payer: Self-pay

## 2019-05-29 VITALS — BP 116/70 | HR 90 | Temp 98.2°F | Ht 68.0 in | Wt 177.0 lb

## 2019-05-29 DIAGNOSIS — J849 Interstitial pulmonary disease, unspecified: Secondary | ICD-10-CM

## 2019-05-29 DIAGNOSIS — Z5181 Encounter for therapeutic drug level monitoring: Secondary | ICD-10-CM

## 2019-05-29 LAB — COMPREHENSIVE METABOLIC PANEL
ALT: 22 U/L (ref 0–53)
AST: 19 U/L (ref 0–37)
Albumin: 3.8 g/dL (ref 3.5–5.2)
Alkaline Phosphatase: 80 U/L (ref 39–117)
BUN: 32 mg/dL — ABNORMAL HIGH (ref 6–23)
CO2: 24 mEq/L (ref 19–32)
Calcium: 9.7 mg/dL (ref 8.4–10.5)
Chloride: 101 mEq/L (ref 96–112)
Creatinine, Ser: 1.56 mg/dL — ABNORMAL HIGH (ref 0.40–1.50)
GFR: 43.27 mL/min — ABNORMAL LOW (ref >60.00)
Glucose, Bld: 213 mg/dL — ABNORMAL HIGH (ref 70–99)
Potassium: 3.7 mEq/L (ref 3.5–5.1)
Sodium: 135 mEq/L (ref 135–145)
Total Bilirubin: 0.4 mg/dL (ref 0.2–1.2)
Total Protein: 6.4 g/dL (ref 6.0–8.3)

## 2019-05-29 LAB — PULMONARY FUNCTION TEST
DL/VA % pred: 85 %
DL/VA: 3.41 ml/min/mmHg/L
DLCO cor % pred: 59 %
DLCO cor: 13.8 ml/min/mmHg
DLCO unc % pred: 59 %
DLCO unc: 13.8 ml/min/mmHg
FEF 25-75 Post: 5.81 L/sec
FEF 25-75 Pre: 5.67 L/sec
FEF2575-%Change-Post: 2 %
FEF2575-%Pred-Post: 299 %
FEF2575-%Pred-Pre: 292 %
FEV1-%Change-Post: 1 %
FEV1-%Pred-Post: 88 %
FEV1-%Pred-Pre: 86 %
FEV1-Post: 2.42 L
FEV1-Pre: 2.37 L
FEV1FVC-%Change-Post: 1 %
FEV1FVC-%Pred-Pre: 125 %
FEV6-%Change-Post: 0 %
FEV6-%Pred-Post: 73 %
FEV6-%Pred-Pre: 73 %
FEV6-Post: 2.63 L
FEV6-Pre: 2.62 L
FEV6FVC-%Pred-Post: 107 %
FEV6FVC-%Pred-Pre: 107 %
FVC-%Change-Post: 0 %
FVC-%Pred-Post: 68 %
FVC-%Pred-Pre: 68 %
FVC-Post: 2.63 L
FVC-Pre: 2.62 L
Post FEV1/FVC ratio: 92 %
Post FEV6/FVC ratio: 100 %
Pre FEV1/FVC ratio: 90 %
Pre FEV6/FVC Ratio: 100 %
RV % pred: 51 %
RV: 1.29 L
TLC % pred: 61 %
TLC: 4.06 L

## 2019-05-29 MED ORDER — OFEV 100 MG PO CAPS: 100.0000 mg | ORAL_CAPSULE | Freq: Two times a day (BID) | ORAL | 3 refills | Status: DC

## 2019-05-29 NOTE — Progress Notes (Signed)
PFT done today.

## 2019-05-29 NOTE — Patient Instructions (Signed)
We will reduce the Ofev 100 mg twice daily Check comprehensive metabolic panel today Follow-up in 1 month with telephone visit with me or NP

## 2019-05-29 NOTE — Progress Notes (Signed)
Jeffrey Brennan    [id number]    [birth year]  Primary Care Physician:[name], [name], PA  Referring Physician: [name], PA [place],  [place] [address]  Chief complaint: Consult for pulmonary fibrosis  HPI: 79 year old with history of CLL, chronic renal insufficiency, diabetes, hypertension Previously followed by Dr. 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  No mold, hot tub, Jacuzzi.  No down pillows or comforter Smoking history: 5-pack-year smoker.  Quit in the 1970s Travel history: No significant travel history Relevant family history: His younger sister had unspecified pulmonary fibrosis.  She died of complications of cirrhosis and alcohol use  Interim history:  Started taking Ofev in December 2020 at 150 mg twice daily.  Has not tolerated it due to symptoms of back and joint pain.  He has not tried a lower dose of Ofev Ofev was stopped in early February with gradual improvement in symptoms.  Outpatient Encounter Medications as of 05/29/2019  Medication Sig  . allopurinol (ZYLOPRIM) 300 MG tablet Take 300 mg by mouth daily.  Marland Kitchen amLODipine (NORVASC) 10 MG tablet Take 10 mg by mouth daily.  . citalopram (CELEXA) 40 MG tablet Take 40 mg by mouth daily.  [name] (EUCRISA) 2 % OINT Apply topically as needed.   . fluticasone (FLONASE) 50 MCG/ACT nasal spray Place 2 sprays into both nostrils daily.  . furosemide (LASIX) 80 MG tablet Take 80-120 mg by mouth See admin instructions. Take 120 mg by mouth in the morning and take 80 mg by mouth at bedtime  . halobetasol (ULTRAVATE) 0.05 %  ointment Apply 1 application topically 2 (two) times daily as needed (for irritation).   Marland Kitchen loratadine (CLARITIN) 10 MG tablet Take 10 mg by mouth daily.  . metFORMIN (GLUCOPHAGE-XR) 500 MG 24 hr tablet Take 1,000 mg by mouth 2 (two) times daily.  Marland Kitchen omeprazole (PRILOSEC) 40 MG capsule Take 40 mg by mouth daily.   . Pediatric Multivitamins-Iron (FLINTSTONES PLUS IRON) chewable tablet Chew 1 tablet by mouth 2 (two) times daily.  . potassium chloride SA (K-DUR,KLOR-CON) 20 MEQ tablet Take 20 mEq by mouth daily.  . pramipexole (MIRAPEX) 0.125 MG tablet Take 0.125-0.25 mg by mouth See admin instructions. Take 0.125 mg by mouth in the morning and take 0.25 mg by mouth at bedtime.   No facility-administered encounter medications on file as of 05/29/2019.   Physical Exam: Gen:      No acute distress HEENT:  EOMI, sclera anicteric Neck:     No masses; no thyromegaly Lungs:    Bibasal crackles; normal respiratory effort CV:         Regular rate and rhythm; no murmurs Abd:      + bowel sounds; soft, non-tender; no palpable masses, no distension Ext:    No edema; adequate peripheral perfusion Skin:      Warm and dry; no rash Neuro: alert and oriented x 3 Psych: normal mood and affect  Data Reviewed: Imaging: CT abdomen pelvis 06/23/2014-mild scarring at the base.  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  Mild lymphadenopathy, 4.1 cm ascending thoracic erythema.  I have reviewed the images personally.  PFTs 05/29/2019 FVC 2.63 [68%], FEV1 2.42 [88%], F/F 92, TLC 4.06 [61%], DLCO 13.80 [59%] Moderate-severe restriction and diffusion defect.  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 Significantly progressed since 2016.  He does not have any signs and symptoms of connective tissue disease or exposures.  Has borderline ANA which is nonspecific.  Started on Ofev but has not tolerated due to joint and back  pain We will attempt at a low-dose of 100 mg twice daily.  If he still has a problem then consider Esbriet. Check CMP today to monitor liver and renal function  Plan/Recommendations: - Continue Ofev.  Reduce dose to 100 mg twice daily - CMP  Marshell Garfinkel MD [facility] 05/29/2019, 2:26 PM  CC: [name], [place]
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
## 2019-05-30 ENCOUNTER — Ambulatory Visit (INDEPENDENT_AMBULATORY_CARE_PROVIDER_SITE_OTHER): Payer: PPO

## 2019-05-30 DIAGNOSIS — J019 Acute sinusitis, unspecified: Secondary | ICD-10-CM | POA: Diagnosis not present

## 2019-05-30 DIAGNOSIS — J849 Interstitial pulmonary disease, unspecified: Secondary | ICD-10-CM | POA: Diagnosis not present

## 2019-05-30 DIAGNOSIS — I10 Essential (primary) hypertension: Secondary | ICD-10-CM | POA: Diagnosis not present

## 2019-05-30 DIAGNOSIS — C9191 Lymphoid leukemia, unspecified, in remission: Secondary | ICD-10-CM | POA: Diagnosis not present

## 2019-05-30 DIAGNOSIS — E119 Type 2 diabetes mellitus without complications: Secondary | ICD-10-CM | POA: Diagnosis not present

## 2019-05-30 DIAGNOSIS — N183 Chronic kidney disease, stage 3 unspecified: Secondary | ICD-10-CM | POA: Diagnosis not present

## 2019-05-30 NOTE — Progress Notes (Signed)
SIX MIN WALK 05/30/2019  Medications allopurinol 37m, amlodipine 12m citalopram 4059mflonase 85m31mfurosemide 80mg98mratadine 10mg,64mformin 500mg, 15mrazole 40mg, p65msium chloride 20meq, m59mex 0.125mg at 95mSupplimental Oxygen during Test? (L/min) No  Laps 8  Partial Lap (in Meters) 0  Baseline BP (sitting) 118/60  Baseline Heartrate 84  Baseline Dyspnea (Borg Scale) 1  Baseline Fatigue (Borg Scale) 1  Baseline SPO2 98  BP (sitting) 136/82  Heartrate 93  Dyspnea (Borg Scale) 4  Fatigue (Borg Scale) 1  SPO2 95  BP (sitting) 126/62  Heartrate 89  SPO2 98  Stopped or Paused before Six Minutes No  Interpretation Hip pain  Distance Completed 272  Tech Comments: Pt walked at a normal pace without stopping completing the entire 6 min. Pt denied any complaints.

## 2019-06-05 DIAGNOSIS — R600 Localized edema: Secondary | ICD-10-CM | POA: Diagnosis not present

## 2019-06-05 DIAGNOSIS — E1122 Type 2 diabetes mellitus with diabetic chronic kidney disease: Secondary | ICD-10-CM | POA: Diagnosis not present

## 2019-06-05 DIAGNOSIS — I129 Hypertensive chronic kidney disease with stage 1 through stage 4 chronic kidney disease, or unspecified chronic kidney disease: Secondary | ICD-10-CM | POA: Diagnosis not present

## 2019-06-05 DIAGNOSIS — M109 Gout, unspecified: Secondary | ICD-10-CM | POA: Diagnosis not present

## 2019-06-05 DIAGNOSIS — J849 Interstitial pulmonary disease, unspecified: Secondary | ICD-10-CM | POA: Diagnosis not present

## 2019-06-05 DIAGNOSIS — C9110 Chronic lymphocytic leukemia of B-cell type not having achieved remission: Secondary | ICD-10-CM | POA: Diagnosis not present

## 2019-06-05 DIAGNOSIS — N2581 Secondary hyperparathyroidism of renal origin: Secondary | ICD-10-CM | POA: Diagnosis not present

## 2019-06-05 DIAGNOSIS — N183 Chronic kidney disease, stage 3 unspecified: Secondary | ICD-10-CM | POA: Diagnosis not present

## 2019-06-13 DIAGNOSIS — L270 Generalized skin eruption due to drugs and medicaments taken internally: Secondary | ICD-10-CM | POA: Diagnosis not present

## 2019-06-16 DIAGNOSIS — R42 Dizziness and giddiness: Secondary | ICD-10-CM | POA: Diagnosis not present

## 2019-06-27 ENCOUNTER — Other Ambulatory Visit: Payer: Self-pay

## 2019-06-27 ENCOUNTER — Ambulatory Visit: Payer: PPO | Admitting: Pulmonary Disease

## 2019-07-08 ENCOUNTER — Other Ambulatory Visit: Payer: Self-pay

## 2019-07-08 ENCOUNTER — Encounter: Payer: Self-pay | Admitting: Primary Care

## 2019-07-08 ENCOUNTER — Ambulatory Visit (INDEPENDENT_AMBULATORY_CARE_PROVIDER_SITE_OTHER): Payer: PPO | Admitting: Primary Care

## 2019-07-08 DIAGNOSIS — J849 Interstitial pulmonary disease, unspecified: Secondary | ICD-10-CM

## 2019-07-08 MED ORDER — OFEV 100 MG PO CAPS: 100.0000 mg | ORAL_CAPSULE | Freq: Two times a day (BID) | ORAL | 6 refills | Status: DC

## 2019-07-08 NOTE — Progress Notes (Signed)
Virtual Visit via Telephone Note  I connected with Jeffrey Brennan on 07/08/19 at 11:30 AM EDT by telephone and verified that I am speaking with the correct person using two identifiers.  Location: Patient: Home Provider: Home   I discussed the limitations, risks, security and privacy concerns of performing an evaluation and management service by telephone and the availability of in person appointments. I also discussed with the patient that there may be a patient responsible charge related to this service. The patient expressed understanding and agreed to proceed.   History of Present Illness: 79 year old male, former smoker. PMH significant for ILD, CLL, chronic renal insufficiency, diabetes, hypertension. Patient of Dr. Vaughan Browner, last seen on 05/29/19. Started OFEV in December 2020 at 112m twice. Did not tolerate this medication d/t back and joint pain.  OFEV was stopped back in February with gradual improvement. Plan resume Ofev at lower dose 1039mtwice daily. If he still has a problems then consider Esbriet.   07/08/2019 Patient presents today for follow-up visit. States that he never restarted Ofev at lower dose. Copay on original dose of antifibrotic medication was 2,000 dollars. He states that social services contacted medication company and medication was provided to him for free. He only has 15084mose at home. He has not been taking any medication since February. His Breathing is a little worse with current pollen outside.   Observations/Objective:  -No obvious shortness of breath, wheezing or cough  Assessment and Plan:  ILD: - OFEV was stopped back in February d/t joint/back pain - Resume OFEV at lower dose 100m19mice daily (presciption sent) - If still having side effect issues consider Esbriet  - Check CMET next visit   Follow Up Instructions:  - Follow-up in 4 weeks with Dr. MannVaughan BrownerAPP to ensure medication restart   I discussed the assessment and treatment plan with  the patient. The patient was provided an opportunity to ask questions and all were answered. The patient agreed with the plan and demonstrated an understanding of the instructions.   The patient was advised to call back or seek an in-person evaluation if the symptoms worsen or if the condition fails to improve as anticipated.  I provided 18 minutes of non-face-to-face time during this encounter.   Jeffrey Brennan

## 2019-07-08 NOTE — Patient Instructions (Addendum)
Orders: Needs to resume OFEV 138m twice daily   Follow-up: 4 weeks with Dr. MVaughan Browneror BEustaquio MaizeNP

## 2019-07-14 ENCOUNTER — Telehealth: Payer: Self-pay

## 2019-07-14 NOTE — Telephone Encounter (Signed)
Pt stopped OFEV back in February and would like to start again? Does he need a new application?

## 2019-07-14 NOTE — Telephone Encounter (Signed)
Documentation is unclear where patient was receiving medication from. Called [employer], patient was enrolled with them in 2020, but they never received a re-enrollment form from patient for 2021. Patient will need to reapply for patient assistance. Processing time is 48 hours once everything has been received. Will mail patient an application.

## 2019-07-15 NOTE — Telephone Encounter (Signed)
Called and spoke to patient, he says he did not receive a renewal application. Place in mail today. Patient will mail application once he completes it.  10:44 AM Beatriz Chancellor, CPhT

## 2019-07-18 DIAGNOSIS — J310 Chronic rhinitis: Secondary | ICD-10-CM | POA: Diagnosis not present

## 2019-07-18 DIAGNOSIS — R42 Dizziness and giddiness: Secondary | ICD-10-CM | POA: Diagnosis not present

## 2019-07-18 DIAGNOSIS — H903 Sensorineural hearing loss, bilateral: Secondary | ICD-10-CM | POA: Diagnosis not present

## 2019-07-18 DIAGNOSIS — J343 Hypertrophy of nasal turbinates: Secondary | ICD-10-CM | POA: Diagnosis not present

## 2019-07-28 DIAGNOSIS — R42 Dizziness and giddiness: Secondary | ICD-10-CM | POA: Diagnosis not present

## 2019-07-28 NOTE — Telephone Encounter (Signed)
Received patient application and income documents.  I signed the provider portion and can fax to [facility] cares tomorrow while in office.  Thanks!   Mariella Saa, PharmD, [place], [place] Clinical Specialty Pharmacist [phone]  07/28/2019 4:04 PM

## 2019-07-29 NOTE — Telephone Encounter (Signed)
Submitted Patient Assistance Application to [employer] for OFEV along with provider portion, PA and income documents. Will update patient when we receive a response.  Fax# [phone]  Phone# [phone]  

## 2019-08-04 DIAGNOSIS — J310 Chronic rhinitis: Secondary | ICD-10-CM | POA: Diagnosis not present

## 2019-08-04 DIAGNOSIS — R42 Dizziness and giddiness: Secondary | ICD-10-CM | POA: Diagnosis not present

## 2019-08-04 DIAGNOSIS — H903 Sensorineural hearing loss, bilateral: Secondary | ICD-10-CM | POA: Diagnosis not present

## 2019-08-04 DIAGNOSIS — J343 Hypertrophy of nasal turbinates: Secondary | ICD-10-CM | POA: Diagnosis not present

## 2019-08-05 NOTE — Telephone Encounter (Signed)
Received notification from  [facility] regarding an approval for OFEV from 07/30/19 to 04/02/20.   Phone number: [phone]

## 2019-08-06 NOTE — Telephone Encounter (Signed)
Called to notify patient. No answer and no voicemail.  Mariella Saa, PharmD, [place], [place] Clinical Specialty Pharmacist [phone]  08/06/2019 2:29 PM

## 2019-08-25 ENCOUNTER — Other Ambulatory Visit: Payer: Self-pay

## 2019-08-25 ENCOUNTER — Ambulatory Visit: Payer: PPO | Admitting: Orthopaedic Surgery

## 2019-08-25 ENCOUNTER — Ambulatory Visit (INDEPENDENT_AMBULATORY_CARE_PROVIDER_SITE_OTHER): Payer: PPO

## 2019-08-25 DIAGNOSIS — M25512 Pain in left shoulder: Secondary | ICD-10-CM | POA: Diagnosis not present

## 2019-08-25 DIAGNOSIS — M7061 Trochanteric bursitis, right hip: Secondary | ICD-10-CM

## 2019-08-25 MED ORDER — LIDOCAINE HCL 1 % IJ SOLN
3.0000 mL | INTRAMUSCULAR | Status: AC | PRN
  Administered 2019-08-25: 3 mL

## 2019-08-25 MED ORDER — METHYLPREDNISOLONE ACETATE 40 MG/ML IJ SUSP
40.0000 mg | INTRAMUSCULAR | Status: AC | PRN
  Administered 2019-08-25: 40 mg via INTRA_ARTICULAR
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
## 2019-09-02 ENCOUNTER — Telehealth: Payer: Self-pay | Admitting: Pulmonary Disease

## 2019-09-02 DIAGNOSIS — J849 Interstitial pulmonary disease, unspecified: Secondary | ICD-10-CM

## 2019-09-02 MED ORDER — OFEV 100 MG PO CAPS: 100.0000 mg | ORAL_CAPSULE | Freq: Two times a day (BID) | ORAL | 6 refills | Status: DC

## 2019-09-02 NOTE — Telephone Encounter (Signed)
Spoke with the pt to verify pharmacy. Rx for ofev was refilled. Nothing further needed.

## 2019-09-04 DIAGNOSIS — J310 Chronic rhinitis: Secondary | ICD-10-CM | POA: Diagnosis not present

## 2019-09-04 DIAGNOSIS — J343 Hypertrophy of nasal turbinates: Secondary | ICD-10-CM | POA: Diagnosis not present

## 2019-09-19 ENCOUNTER — Other Ambulatory Visit: Payer: Self-pay | Admitting: Pulmonary Disease

## 2019-09-19 NOTE — Telephone Encounter (Signed)
Spoke with patient. He is aware that he will need to call for his refills. Nothing further needed at time of call.

## 2019-09-19 NOTE — Telephone Encounter (Signed)
Called and spoke with pt. Pt stated after he called office 6/1 requesting a refill of his OFEV, he stated that he never received it. When looking at pt's pharmacies we have listed, see that we have [facility] as well as [facility] listed. Asked pt if [facility] was the pharmacy that he receives his OFEV from and he did not know.  Rachael/Amber, is there any way you can help [place] out with this?

## 2019-09-19 NOTE — Telephone Encounter (Signed)
I attempted to call patient, no answer and voicemail is not set up.

## 2019-09-19 NOTE — Telephone Encounter (Signed)
Patient is approved for [employer] C[employer] PAP. He has refills. He just needs to call- Phone number: [phone]

## 2019-09-19 NOTE — Telephone Encounter (Signed)
Pt doesn't have pharmacy name but left the phone number [phone] which he believes is the pharmacy Fax number he thinks is [phone]

## 2019-10-20 DIAGNOSIS — M6740 Ganglion, unspecified site: Secondary | ICD-10-CM | POA: Diagnosis not present

## 2019-10-20 DIAGNOSIS — L570 Actinic keratosis: Secondary | ICD-10-CM | POA: Diagnosis not present

## 2019-10-20 DIAGNOSIS — Z85828 Personal history of other malignant neoplasm of skin: Secondary | ICD-10-CM | POA: Diagnosis not present

## 2019-10-20 DIAGNOSIS — D692 Other nonthrombocytopenic purpura: Secondary | ICD-10-CM | POA: Diagnosis not present

## 2019-10-20 DIAGNOSIS — D225 Melanocytic nevi of trunk: Secondary | ICD-10-CM | POA: Diagnosis not present

## 2019-10-20 DIAGNOSIS — L821 Other seborrheic keratosis: Secondary | ICD-10-CM | POA: Diagnosis not present

## 2019-10-20 DIAGNOSIS — D1801 Hemangioma of skin and subcutaneous tissue: Secondary | ICD-10-CM | POA: Diagnosis not present

## 2019-11-06 ENCOUNTER — Other Ambulatory Visit: Payer: Self-pay

## 2019-11-06 ENCOUNTER — Ambulatory Visit: Payer: PPO | Admitting: Orthopaedic Surgery

## 2019-11-06 DIAGNOSIS — M7061 Trochanteric bursitis, right hip: Secondary | ICD-10-CM

## 2019-11-06 DIAGNOSIS — Z96641 Presence of right artificial hip joint: Secondary | ICD-10-CM | POA: Diagnosis not present

## 2019-11-06 DIAGNOSIS — M25512 Pain in left shoulder: Secondary | ICD-10-CM | POA: Diagnosis not present

## 2019-11-06 MED ORDER — TRAMADOL HCL 50 MG PO TABS: 50.0000 mg | ORAL_TABLET | Freq: Four times a day (QID) | ORAL | 0 refills | Status: DC | PRN
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
## 2019-11-13 DIAGNOSIS — D508 Other iron deficiency anemias: Secondary | ICD-10-CM | POA: Diagnosis not present

## 2019-11-13 DIAGNOSIS — C9110 Chronic lymphocytic leukemia of B-cell type not having achieved remission: Secondary | ICD-10-CM | POA: Diagnosis not present

## 2019-11-20 DIAGNOSIS — R634 Abnormal weight loss: Secondary | ICD-10-CM | POA: Diagnosis not present

## 2019-11-20 DIAGNOSIS — Z23 Encounter for immunization: Secondary | ICD-10-CM | POA: Diagnosis not present

## 2019-11-20 DIAGNOSIS — C9110 Chronic lymphocytic leukemia of B-cell type not having achieved remission: Secondary | ICD-10-CM | POA: Diagnosis not present

## 2019-11-20 DIAGNOSIS — N289 Disorder of kidney and ureter, unspecified: Secondary | ICD-10-CM | POA: Diagnosis not present

## 2019-12-09 DIAGNOSIS — R634 Abnormal weight loss: Secondary | ICD-10-CM | POA: Insufficient documentation

## 2019-12-09 DIAGNOSIS — Z23 Encounter for immunization: Secondary | ICD-10-CM | POA: Insufficient documentation

## 2019-12-16 DIAGNOSIS — E782 Mixed hyperlipidemia: Secondary | ICD-10-CM | POA: Diagnosis not present

## 2019-12-16 DIAGNOSIS — E1122 Type 2 diabetes mellitus with diabetic chronic kidney disease: Secondary | ICD-10-CM | POA: Diagnosis not present

## 2019-12-16 DIAGNOSIS — K219 Gastro-esophageal reflux disease without esophagitis: Secondary | ICD-10-CM | POA: Diagnosis not present

## 2019-12-16 DIAGNOSIS — I10 Essential (primary) hypertension: Secondary | ICD-10-CM | POA: Diagnosis not present

## 2019-12-16 DIAGNOSIS — N183 Chronic kidney disease, stage 3 unspecified: Secondary | ICD-10-CM | POA: Diagnosis not present

## 2019-12-19 DIAGNOSIS — M1A00X0 Idiopathic chronic gout, unspecified site, without tophus (tophi): Secondary | ICD-10-CM | POA: Diagnosis not present

## 2019-12-19 DIAGNOSIS — E782 Mixed hyperlipidemia: Secondary | ICD-10-CM | POA: Diagnosis not present

## 2019-12-19 DIAGNOSIS — E119 Type 2 diabetes mellitus without complications: Secondary | ICD-10-CM | POA: Diagnosis not present

## 2019-12-19 DIAGNOSIS — Z23 Encounter for immunization: Secondary | ICD-10-CM | POA: Diagnosis not present

## 2019-12-19 DIAGNOSIS — Z0001 Encounter for general adult medical examination with abnormal findings: Secondary | ICD-10-CM | POA: Diagnosis not present

## 2019-12-19 DIAGNOSIS — Z6823 Body mass index (BMI) 23.0-23.9, adult: Secondary | ICD-10-CM | POA: Diagnosis not present

## 2019-12-19 DIAGNOSIS — I10 Essential (primary) hypertension: Secondary | ICD-10-CM | POA: Diagnosis not present

## 2019-12-23 DIAGNOSIS — M542 Cervicalgia: Secondary | ICD-10-CM | POA: Diagnosis not present

## 2020-01-08 ENCOUNTER — Telehealth: Payer: Self-pay | Admitting: Pulmonary Disease

## 2020-01-08 NOTE — Telephone Encounter (Signed)
Spoke with pt, states that he has decreased Ofev, is still experiencing joint pain, general body aches, flu-like symptoms.  Pt stopped the medicine X1 month and s/s resolved.  Pt restarted med and his symptoms resumed.  Pt states that Dr.Mannam had previously mentioned trying an alternative med.  Pt currently gets med through [facility].   Dr. Vaughan Browner please advise if you are wanting to switch patient to Esbriet instead of Ofev.  Thanks!

## 2020-01-09 NOTE — Telephone Encounter (Signed)
Yes.  Please start paperwork for Esbriet as he is not tolerating the Ofev

## 2020-01-09 NOTE — Telephone Encounter (Signed)
Spoke with patient. He is ok with starting the paperwork for Esbriet. Paperwork has been started. Will have Dr. Vaughan Browner to sign.

## 2020-01-15 NOTE — Telephone Encounter (Signed)
Lattie Haw, do you know if this was completed? Thanks.

## 2020-01-16 NOTE — Telephone Encounter (Signed)
Dr Vaughan Browner returns to office 01/20/20.  Will have him sign once he returns to office.

## 2020-01-20 NOTE — Telephone Encounter (Signed)
Esbriet enrollment completed and signed by Dr. Vaughan Browner.  Completed Esbriet enrollment given to Pharmacy team to follow up.

## 2020-01-21 ENCOUNTER — Telehealth: Payer: Self-pay | Admitting: Pharmacy Technician

## 2020-01-21 NOTE — Telephone Encounter (Signed)
Received some signed new start paperwork for ESBRIET. Will update as we work through the benefits process.  Received notification from [facility] regarding a prior authorization for ESBRIET CAPS. Authorization has been APPROVED from 01/21/20 to 01/20/21.   Authorization # Key: [id number] - [place] Case ID: [id number]   Called patient to confirm income and household size to apply for [employer] PF grant, voicemail is not set up.   Will follow up.

## 2020-01-22 DIAGNOSIS — M542 Cervicalgia: Secondary | ICD-10-CM | POA: Diagnosis not present

## 2020-01-27 NOTE — Telephone Encounter (Signed)
Spoke to patient and got him enrolled into PF [employer] for copay assistance. Coverage dates are 12/28/19 through 12/27/20.  Pharmacy Card [phone] [id number]   Please send Esbriet prescription to [facility]. Patient will need starter and maintenance doses.  Thanks! Beatriz Chancellor, CPhT

## 2020-01-27 NOTE — Telephone Encounter (Signed)
Dr Vaughan Browner- please advise on dosing for Esbriet so we can send rx, thanks!

## 2020-01-28 DIAGNOSIS — R600 Localized edema: Secondary | ICD-10-CM | POA: Diagnosis not present

## 2020-01-28 DIAGNOSIS — N2581 Secondary hyperparathyroidism of renal origin: Secondary | ICD-10-CM | POA: Diagnosis not present

## 2020-01-28 DIAGNOSIS — N183 Chronic kidney disease, stage 3 unspecified: Secondary | ICD-10-CM | POA: Diagnosis not present

## 2020-01-28 DIAGNOSIS — I129 Hypertensive chronic kidney disease with stage 1 through stage 4 chronic kidney disease, or unspecified chronic kidney disease: Secondary | ICD-10-CM | POA: Diagnosis not present

## 2020-01-28 MED ORDER — ESBRIET 267 MG PO CAPS: ORAL_CAPSULE | ORAL | 0 refills | Status: DC

## 2020-01-28 MED ORDER — ESBRIET 267 MG PO CAPS: 3.0000 | ORAL_CAPSULE | Freq: Three times a day (TID) | ORAL | 5 refills | Status: DC

## 2020-01-28 NOTE — Telephone Encounter (Signed)
Esbriet dosing  Days 1 to 7: 267 mg 3 times daily (total dose: 801 mg/day) Days 8 to 14: 534 mg 3 times daily (total dose: 1,602 mg/day) Day 15 and thereafter: 801 mg 3 times daily (total dose: 2,403 mg/day). Maximum dose: 2,403 mg/day.

## 2020-01-28 NOTE — Telephone Encounter (Signed)
Rxs have been sent in.

## 2020-02-02 ENCOUNTER — Other Ambulatory Visit: Payer: Self-pay | Admitting: Pulmonary Disease

## 2020-02-02 MED ORDER — PIRFENIDONE 267 MG PO CAPS: ORAL_CAPSULE | ORAL | 0 refills | Status: DC

## 2020-02-02 MED FILL — ESBRIET 267 MG CAPSULE: 267 | 30 days supply | Qty: 207 | Fill #0

## 2020-02-02 NOTE — Addendum Note (Signed)
Addended by: Cassandria Anger on: 02/02/2020 11:00 AM   Modules accepted: Orders

## 2020-02-02 NOTE — Telephone Encounter (Addendum)
Sent new Esbriet rx for the first month to [facility], ok'd per Dr. Vaughan Browner. Otho Darner #207 for 30 days.  No further action needed.  Knox Saliva, PharmD, MPH Clinical Pharmacist (Rheumatology and Pulmonology)

## 2020-02-02 NOTE — Addendum Note (Signed)
Addended by: Cassandria Anger on: 02/02/2020 10:00 AM   Modules accepted: Orders

## 2020-02-02 NOTE — Telephone Encounter (Signed)
Received message form pharmacy that Esbriet taper was not sent in with the correct quantity. Starting taper is typically #207 for 30 day supply.  Can someone please verify and resend prescription?  Thanks!

## 2020-02-02 NOTE — Addendum Note (Signed)
Addended by: Cassandria Anger on: 02/02/2020 10:03 AM   Modules accepted: Orders

## 2020-02-13 ENCOUNTER — Telehealth: Payer: Self-pay | Admitting: Pulmonary Disease

## 2020-02-13 MED ORDER — PREDNISONE 10 MG PO TABS
ORAL_TABLET | ORAL | 0 refills | Status: DC
Start: 2020-02-13 — End: 2020-03-10

## 2020-02-13 NOTE — Telephone Encounter (Signed)
Spoke with pt, aware of recs.  rov scheduled.  rx sent to preferred pharmacy.  Nothing further needed at this time- will close encounter.

## 2020-02-13 NOTE — Telephone Encounter (Signed)
Needs to set up ov with Mannam but in the meatime  1) increase prilosec until ov to Take 30- 60 min before your first and last meals of the day  2) Prednisone 10 mg take  4 each am x 2 days,   2 each am x 2 days,  1 each am x 2 days and stop   3)  Take delsym two tsp every 12 hours and supplement if needed with  tramadol 50 mg up to 1 every 4 hours to suppress the urge to cough.  (has tramadol listed but ok to just delsym if none left)

## 2020-02-13 NOTE — Telephone Encounter (Signed)
Primary Pulmonologist: Dr. Vaughan Browner  Last office visit and with whom: 07/08/19 televisit with Derl Barrow NP What do we see them for (pulmonary problems): ILD Last OV assessment/plan:   Assessment and Plan:  ILD: - OFEV was stopped back in February d/t joint/back pain - Resume OFEV at lower dose 119m twice daily (presciption sent) - If still having side effect issues consider Esbriet  - Check CMET next visit   Patient not on Ofev, and hasn't started Esbriet     Reason for call: Patient calling because over the past two weeks patient has been having worsening cough mostly dry occassional sputum clear or yellow, can only walk about 253fbefore he has to stop and catch his breath. Patient states his oxygen sats are 89-93% when he checks them.   States he notices the cough is worse first when he wakes up and right before.  Today he woke up and was really short of breath at 4am but didn't check is oxygen at that time. Not taking any inhalers or any over the counter drugs  Dr. WeMelvyn Novaslease advise.  Allergies  Allergen Reactions  . Benadryl Allergy [Diphenhydramine Hcl]     Does not know what it does to him  . Reglan [Metoclopramide] Other (See Comments)    Makes me crazy   . Doxycycline Rash    Immunization History  Administered Date(s) Administered  . Influenza, High Dose Seasonal PF 02/16/2017, 01/17/2019  . Influenza,inj,Quad PF,6+ Mos 01/16/2018  . Influenza-Unspecified 03/19/2016  . Pneumococcal Conjugate-13 07/08/2013  . Pneumococcal Polysaccharide-23 11/05/2006

## 2020-02-24 DIAGNOSIS — M542 Cervicalgia: Secondary | ICD-10-CM | POA: Diagnosis not present

## 2020-02-24 DIAGNOSIS — M436 Torticollis: Secondary | ICD-10-CM | POA: Diagnosis not present

## 2020-03-04 DIAGNOSIS — J343 Hypertrophy of nasal turbinates: Secondary | ICD-10-CM | POA: Diagnosis not present

## 2020-03-04 DIAGNOSIS — J310 Chronic rhinitis: Secondary | ICD-10-CM | POA: Diagnosis not present

## 2020-03-04 DIAGNOSIS — J342 Deviated nasal septum: Secondary | ICD-10-CM | POA: Diagnosis not present

## 2020-03-10 ENCOUNTER — Other Ambulatory Visit: Payer: Self-pay

## 2020-03-10 ENCOUNTER — Encounter: Payer: Self-pay | Admitting: Pulmonary Disease

## 2020-03-10 ENCOUNTER — Ambulatory Visit: Payer: PPO | Admitting: Pulmonary Disease

## 2020-03-10 VITALS — BP 142/64 | HR 95 | Temp 98.5°F | Ht 68.0 in | Wt 165.4 lb

## 2020-03-10 DIAGNOSIS — J849 Interstitial pulmonary disease, unspecified: Secondary | ICD-10-CM | POA: Diagnosis not present

## 2020-03-10 DIAGNOSIS — J84112 Idiopathic pulmonary fibrosis: Secondary | ICD-10-CM

## 2020-03-10 DIAGNOSIS — Z5181 Encounter for therapeutic drug level monitoring: Secondary | ICD-10-CM

## 2020-03-10 LAB — COMPREHENSIVE METABOLIC PANEL
ALT: 13 U/L (ref 0–53)
AST: 16 U/L (ref 0–37)
Albumin: 4 g/dL (ref 3.5–5.2)
Alkaline Phosphatase: 85 U/L (ref 39–117)
BUN: 40 mg/dL — ABNORMAL HIGH (ref 6–23)
CO2: 26 mEq/L (ref 19–32)
Calcium: 9.8 mg/dL (ref 8.4–10.5)
Chloride: 102 mEq/L (ref 96–112)
Creatinine, Ser: 1.89 mg/dL — ABNORMAL HIGH (ref 0.40–1.50)
GFR: 33.54 mL/min — ABNORMAL LOW (ref >60.00)
Glucose, Bld: 160 mg/dL — ABNORMAL HIGH (ref 70–99)
Potassium: 3.7 mEq/L (ref 3.5–5.1)
Sodium: 137 mEq/L (ref 135–145)
Total Bilirubin: 0.4 mg/dL (ref 0.2–1.2)
Total Protein: 6.6 g/dL (ref 6.0–8.3)

## 2020-03-10 LAB — HEPATIC FUNCTION PANEL
ALT: 13 U/L (ref 0–53)
AST: 16 U/L (ref 0–37)
Albumin: 4 g/dL (ref 3.5–5.2)
Alkaline Phosphatase: 85 U/L (ref 39–117)
Bilirubin, Direct: 0.1 mg/dL (ref 0.0–0.3)
Total Bilirubin: 0.4 mg/dL (ref 0.2–1.2)
Total Protein: 6.6 g/dL (ref 6.0–8.3)

## 2020-03-10 NOTE — Progress Notes (Addendum)
Jeffrey Brennan    [id number]    1940-08-03  Primary Care Physician:Jeffrey Brennan  Referring Physician: [name], Brennan [place],  [place] [address]  Chief complaint: Follow-up for IPF Started Ofev December 2020 but was poorly tolerated. Switch to Esbriet in October 5320  HPI: 79 year old with history of CLL, chronic renal insufficiency, diabetes, hypertension Previously followed by Dr. 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  No mold, hot tub, Jacuzzi.  No down pillows or comforter Smoking history: 5-pack-year smoker.  Quit in the 1970s Travel history: No significant travel history Relevant family history: His younger sister had unspecified pulmonary fibrosis.  She died of complications of cirrhosis and alcohol use  Interim history:  Started taking Ofev in December 2020 at 150 mg twice daily.  Has not tolerated it due to symptoms of back and joint pain.  Symptoms persisted after medication was held and restarted at a lower dose of 100 mg twice daily.  Switch to Esbriet in October.  He is just started this a couple of weeks ago and is tolerating it well so far. Has chronic dyspnea on exertion which is unchanged from baseline.  Outpatient Encounter Medications as of 03/10/2020  Medication Sig  . allopurinol (ZYLOPRIM) 300 MG tablet Take 300 mg by mouth daily.  Marland Kitchen amLODipine (NORVASC) 10 MG tablet Take 10 mg by mouth daily.  . citalopram (CELEXA) 40 MG tablet Take 40 mg by mouth daily.  Jeffrey Brennan  (EUCRISA) 2 % OINT Apply topically as needed.   . fluticasone (FLONASE) 50 MCG/ACT nasal spray Place 2 sprays into both nostrils daily.  . furosemide (LASIX) 80 MG tablet Take 80-120 mg by mouth See admin instructions. Take 120 mg by mouth in the morning and take 80 mg by mouth at bedtime  . halobetasol (ULTRAVATE) 0.05 % ointment Apply 1 application topically 2 (two) times daily as needed (for irritation).   Marland Kitchen loratadine (CLARITIN) 10 MG tablet Take 10 mg by mouth daily.  . metFORMIN (GLUCOPHAGE-XR) 500 MG 24 hr tablet Take 1,000 mg by mouth 2 (two) times daily.  Marland Kitchen omeprazole (PRILOSEC) 40 MG capsule Take 40 mg by mouth daily.   . Pirfenidone (ESBRIET) 267 MG CAPS Take 3 capsules by mouth 3 (three) times daily.  . Pirfenidone 267 MG CAPS Take 1 cap TID x7 days, 2 caps TID x7 days, then 3 cap TID thereafter  . potassium chloride SA (K-DUR,KLOR-CON) 20 MEQ tablet Take 20 mEq by mouth daily.  . pramipexole (MIRAPEX) 0.125 MG tablet Take 0.125-0.25 mg by mouth See admin instructions. Take 0.125 mg by mouth in the morning and take 0.25 mg by mouth at bedtime.  . traMADol (ULTRAM) 50 MG tablet Take 1-2 tablets (50-100 mg total) by mouth every 6 (six) hours as needed.  . [DISCONTINUED] Nintedanib (OFEV) 100 MG CAPS Take 1 capsule (100 mg total) by mouth 2 (two) times daily.  . [DISCONTINUED] predniSONE (DELTASONE) 10 MG tablet 62mX2 days, 212mX2 days, 1047m days, then stop.   No facility-administered encounter medications  on file as of 03/10/2020.   Physical Exam: Blood pressure (!) 142/64, pulse 95, temperature 98.5 F (36.9 C), temperature source Skin, height _0  (1.727 m), weight 165 lb 6.4 oz (75 kg), SpO2 96 %. Gen:      No acute distress HEENT:  EOMI, sclera anicteric Neck:     No masses; no thyromegaly Lungs:    Clear to auscultation bilaterally; normal respiratory effort CV:         Regular rate and rhythm; no murmurs Abd:      + bowel sounds; soft, non-tender; no palpable masses, no  distension Ext:    No edema; adequate peripheral perfusion Skin:      Warm and dry; no rash Neuro: alert and oriented x 3 Psych: normal mood and affect  Data Reviewed: Imaging: CT abdomen pelvis 06/23/2014-mild scarring at the base.  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  Mild lymphadenopathy, 4.1 cm ascending thoracic erythema. I have reviewed the images personally.  PFTs 05/29/2019 FVC 2.63 [68%], FEV1 2.42 [88%], F/F 92, TLC 4.06 [61%], DLCO 13.80 [59%] Moderate-severe restriction and diffusion defect.  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 Significantly progressed since 2016.  He does not have any signs and symptoms of connective tissue disease or exposures.  Has borderline ANA which is nonspecific.  Started on Ofev but has not tolerated due to joint and back pain and hence on Esbriet Check CMP and proBNP for monitoring But discussed pulmonary rehab but he has transportation issues and would not be able to attend. Ambulatory oximetry today with transient desats to 85%.  We will continue to monitor this.  Annual follow-up with high-res CT and PFTs in February.   Plan/Recommendations: - Esbriet - CMP, proBNP - High-res CT and PFTs in February and follow-up in clinic  Jeffrey Garfinkel MD [facility] 03/10/2020, 10:41 AM  CC: Jeffrey Brennan

## 2020-03-10 NOTE — Patient Instructions (Addendum)
I am glad you are tolerating the new medication We will check hepatic panel and proBNP for monitoring  We will repeat hepatic panel in 1 month Follow-up in clinic in 2 months with high-res CT and PFTs.

## 2020-03-10 NOTE — Addendum Note (Signed)
Addended by: Elton Sin on: 03/10/2020 11:03 AM   Modules accepted: Orders

## 2020-03-10 NOTE — Addendum Note (Signed)
Addended by: Suzzanne Cloud E on: 03/10/2020 11:11 AM   Modules accepted: Orders

## 2020-03-11 LAB — PRO B NATRIURETIC PEPTIDE: NT-Pro BNP: 248 pg/mL (ref 0–486)

## 2020-03-16 ENCOUNTER — Telehealth: Payer: Self-pay | Admitting: Pulmonary Disease

## 2020-03-16 NOTE — Telephone Encounter (Signed)
Spoke with patient regarding lab result. They verbalized understanding. No further questions. States he is diabetic and takes Metformin. Has an appointment with PCP tomorrow and wants this faxed over to his office. Results have been faxed. Nothing further needed at this time.

## 2020-03-17 DIAGNOSIS — M67431 Ganglion, right wrist: Secondary | ICD-10-CM | POA: Diagnosis not present

## 2020-03-17 DIAGNOSIS — Z6823 Body mass index (BMI) 23.0-23.9, adult: Secondary | ICD-10-CM | POA: Diagnosis not present

## 2020-03-24 ENCOUNTER — Other Ambulatory Visit: Payer: Self-pay

## 2020-03-24 ENCOUNTER — Ambulatory Visit: Payer: PPO | Admitting: Orthopaedic Surgery

## 2020-03-24 DIAGNOSIS — G8929 Other chronic pain: Secondary | ICD-10-CM | POA: Diagnosis not present

## 2020-03-24 DIAGNOSIS — M25512 Pain in left shoulder: Secondary | ICD-10-CM

## 2020-03-24 DIAGNOSIS — R2231 Localized swelling, mass and lump, right upper limb: Secondary | ICD-10-CM

## 2020-03-24 MED ORDER — LIDOCAINE HCL 1 % IJ SOLN
3.0000 mL | INTRAMUSCULAR | Status: AC | PRN
  Administered 2020-03-24: 3 mL

## 2020-03-24 MED ORDER — METHYLPREDNISOLONE ACETATE 40 MG/ML IJ SUSP
40.0000 mg | INTRAMUSCULAR | Status: AC | PRN
  Administered 2020-03-24: 40 mg via INTRA_ARTICULAR
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
## 2020-04-01 DIAGNOSIS — N183 Chronic kidney disease, stage 3 unspecified: Secondary | ICD-10-CM | POA: Diagnosis not present

## 2020-04-01 DIAGNOSIS — M7062 Trochanteric bursitis, left hip: Secondary | ICD-10-CM | POA: Diagnosis not present

## 2020-04-01 DIAGNOSIS — Z6824 Body mass index (BMI) 24.0-24.9, adult: Secondary | ICD-10-CM | POA: Diagnosis not present

## 2020-04-01 DIAGNOSIS — E1122 Type 2 diabetes mellitus with diabetic chronic kidney disease: Secondary | ICD-10-CM | POA: Diagnosis not present

## 2020-04-13 ENCOUNTER — Telehealth: Payer: Self-pay | Admitting: Pulmonary Disease

## 2020-04-13 NOTE — Telephone Encounter (Signed)
Called and spoke with pt who states he started taking the [place] 02/21/20 after previously being on OFEV and was having side effects from the [place]. Pt stated he has been taking the [place] off and on since 02/21/20 but states he did stop taking the med 03/15/20.  Pt states he was having problems with joint pain in wrists, ankles, and knees, having abdominal discomfort, and also stated that it made him feel like he had the flu.  Pt stated since stopping the Esbriet, the symptoms have subsided.  Pt wants to know what could be recommended due to his symptoms. Pt does have a f/u scheduled 05/18/20 with Dr. Vaughan Browner having PFT prior.  Dr. Vaughan Browner, please advise.

## 2020-04-13 NOTE — Telephone Encounter (Signed)
He can try a lower dose of one tab 3 times/day. If symptoms persist then stop medication. We will discuss at time of clinic visit on 2/15

## 2020-04-13 NOTE — Telephone Encounter (Signed)
Called and spoke with patient who states that he was having side effects from [place]. Patient states that he did 1 tablet 3 times a day x 7 days, then 2 tabs a day x 7 days and then it became to much. Advised patient to restart 1 tab 3 times a day until his appointment on 2/15. Advised patient that if he got symptoms again for him to stop the medication and that him and Dr. Vaughan Browner would discuss it at his appointment. Patient expressed understanding. Nothing further needed at this time.

## 2020-04-16 DIAGNOSIS — E1122 Type 2 diabetes mellitus with diabetic chronic kidney disease: Secondary | ICD-10-CM | POA: Diagnosis not present

## 2020-04-16 DIAGNOSIS — M25552 Pain in left hip: Secondary | ICD-10-CM | POA: Diagnosis not present

## 2020-04-16 DIAGNOSIS — N183 Chronic kidney disease, stage 3 unspecified: Secondary | ICD-10-CM | POA: Diagnosis not present

## 2020-04-27 ENCOUNTER — Encounter: Payer: Self-pay | Admitting: Orthopaedic Surgery

## 2020-04-27 ENCOUNTER — Ambulatory Visit (INDEPENDENT_AMBULATORY_CARE_PROVIDER_SITE_OTHER): Payer: PPO | Admitting: Orthopaedic Surgery

## 2020-04-27 DIAGNOSIS — G8929 Other chronic pain: Secondary | ICD-10-CM | POA: Diagnosis not present

## 2020-04-27 DIAGNOSIS — M7062 Trochanteric bursitis, left hip: Secondary | ICD-10-CM

## 2020-04-27 DIAGNOSIS — M25512 Pain in left shoulder: Secondary | ICD-10-CM

## 2020-04-27 MED ORDER — METHYLPREDNISOLONE ACETATE 40 MG/ML IJ SUSP
40.0000 mg | INTRAMUSCULAR | Status: AC | PRN
Start: 2020-04-27 — End: 2020-04-27
  Administered 2020-04-27: 40 mg via INTRA_ARTICULAR

## 2020-04-27 MED ORDER — LIDOCAINE HCL 1 % IJ SOLN
3.0000 mL | INTRAMUSCULAR | Status: AC | PRN
Start: 2020-04-27 — End: 2020-04-27
  Administered 2020-04-27: 3 mL
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
## 2020-04-28 DIAGNOSIS — M542 Cervicalgia: Secondary | ICD-10-CM | POA: Diagnosis not present

## 2020-04-28 DIAGNOSIS — M7072 Other bursitis of hip, left hip: Secondary | ICD-10-CM | POA: Diagnosis not present

## 2020-05-13 ENCOUNTER — Ambulatory Visit (INDEPENDENT_AMBULATORY_CARE_PROVIDER_SITE_OTHER)
Admission: RE | Admit: 2020-05-13 | Discharge: 2020-05-13 | Disposition: A | Discharge status: Home / Self Care | Payer: PPO | Source: Ambulatory Visit | Attending: Pulmonary Disease | Admitting: Pulmonary Disease

## 2020-05-13 ENCOUNTER — Other Ambulatory Visit: Payer: Self-pay

## 2020-05-13 DIAGNOSIS — R0602 Shortness of breath: Secondary | ICD-10-CM | POA: Diagnosis not present

## 2020-05-13 DIAGNOSIS — J849 Interstitial pulmonary disease, unspecified: Secondary | ICD-10-CM

## 2020-05-17 ENCOUNTER — Telehealth: Payer: Self-pay | Admitting: Pulmonary Disease

## 2020-05-17 DIAGNOSIS — C9110 Chronic lymphocytic leukemia of B-cell type not having achieved remission: Secondary | ICD-10-CM | POA: Diagnosis not present

## 2020-05-17 NOTE — Telephone Encounter (Signed)
Spoke with pt who stated he receviced Covid test results from [facility]. Pt asked if results were faxed to our office as had requested them to be. At this time no results were in Dr. Matilde Bash box or behind check out desk. Pt was notified. Pt then stated he would try to have results faxed again. Pt was told if fax was not successful again he could bring paper results to PFT appointment and we would scan them in. Pt stated understatement. Pt stated he would call back. Nothing further needed at this time

## 2020-05-18 ENCOUNTER — Encounter: Payer: Self-pay | Admitting: Pulmonary Disease

## 2020-05-18 ENCOUNTER — Ambulatory Visit: Payer: PPO | Admitting: Pulmonary Disease

## 2020-05-18 ENCOUNTER — Ambulatory Visit (INDEPENDENT_AMBULATORY_CARE_PROVIDER_SITE_OTHER): Payer: PPO | Admitting: Pulmonary Disease

## 2020-05-18 ENCOUNTER — Other Ambulatory Visit: Payer: Self-pay

## 2020-05-18 VITALS — BP 138/62 | HR 100 | Temp 98.2°F | Ht 68.0 in | Wt 165.0 lb

## 2020-05-18 DIAGNOSIS — J84112 Idiopathic pulmonary fibrosis: Secondary | ICD-10-CM

## 2020-05-18 DIAGNOSIS — Z5181 Encounter for therapeutic drug level monitoring: Secondary | ICD-10-CM | POA: Diagnosis not present

## 2020-05-18 LAB — PULMONARY FUNCTION TEST
DL/VA % pred: 81 %
DL/VA: 3.22 ml/min/mmHg/L
DLCO cor % pred: 47 %
DLCO cor: 10.94 ml/min/mmHg
DLCO unc % pred: 47 %
DLCO unc: 10.94 ml/min/mmHg
FEF 25-75 Post: 4.67 L/sec
FEF 25-75 Pre: 4.78 L/sec
FEF2575-%Change-Post: -2 %
FEF2575-%Pred-Post: 247 %
FEF2575-%Pred-Pre: 252 %
FEV1-%Change-Post: 0 %
FEV1-%Pred-Post: 80 %
FEV1-%Pred-Pre: 81 %
FEV1-Post: 2.17 L
FEV1-Pre: 2.19 L
FEV1FVC-%Change-Post: 2 %
FEV1FVC-%Pred-Pre: 125 %
FEV6-%Change-Post: -4 %
FEV6-%Pred-Post: 65 %
FEV6-%Pred-Pre: 68 %
FEV6-Post: 2.31 L
FEV6-Pre: 2.42 L
FEV6FVC-%Pred-Post: 107 %
FEV6FVC-%Pred-Pre: 107 %
FVC-%Change-Post: -3 %
FVC-%Pred-Post: 61 %
FVC-%Pred-Pre: 63 %
FVC-Post: 2.34 L
FVC-Pre: 2.42 L
Post FEV1/FVC ratio: 93 %
Post FEV6/FVC ratio: 100 %
Pre FEV1/FVC ratio: 90 %
Pre FEV6/FVC Ratio: 100 %
RV % pred: 21 %
RV: 0.54 L
TLC % pred: 54 %
TLC: 3.59 L

## 2020-05-18 NOTE — Progress Notes (Addendum)
Jeffrey Brennan    [id number]    [birth year]  Primary Care Physician:[name], [name], PA  Referring Physician: [name], PA [place],  [place] [address]  Chief complaint: Follow-up for IPF Started Ofev December 2020 but was poorly tolerated. Switch to Esbriet in October 2021 but was poorly tolerated.  Stopped Esbriet in January 5586  HPI: 80 year old with history of CLL, chronic renal insufficiency, diabetes, hypertension Previously followed by Dr. 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  No mold, hot tub, Jacuzzi.  No down pillows or comforter Smoking history: 5-pack-year smoker.  Quit in the 1970s Travel history: No significant travel history Relevant family history: His younger sister had unspecified pulmonary fibrosis.  She died of complications of cirrhosis and alcohol use  Interim history:  Started taking Ofev in December 2020 at 150 mg twice daily.  Has not tolerated it due to symptoms of back and joint pain.  Symptoms persisted after medication was held and restarted at a lower dose of 100 mg twice daily.  Switch to Esbriet in October 2021 but had similar complaints of joint pain, abdominal discomfort and hence stopped.  He tells me but both the medications made him feel like he had the flu.  Complains of mild worsening of dyspnea on exertion.  Outpatient Encounter Medications as of 05/18/2020  Medication Sig  . allopurinol (ZYLOPRIM) 300 MG tablet Take 300 mg by mouth daily.  Marland Kitchen amLODipine (NORVASC) 10 MG tablet Take 10 mg by mouth daily.  . citalopram (CELEXA) 40 MG tablet Take 40 mg  by mouth daily.  [name] (EUCRISA) 2 % OINT Apply topically as needed.   . fluticasone (FLONASE) 50 MCG/ACT nasal spray Place 2 sprays into both nostrils daily.  . furosemide (LASIX) 80 MG tablet Take 80-120 mg by mouth See admin instructions. Take 120 mg by mouth in the morning and take 80 mg by mouth at bedtime  . halobetasol (ULTRAVATE) 0.05 % ointment Apply 1 application topically 2 (two) times daily as needed (for irritation).   Marland Kitchen loratadine (CLARITIN) 10 MG tablet Take 10 mg by mouth daily.  . metFORMIN (GLUCOPHAGE-XR) 500 MG 24 hr tablet Take 1,000 mg by mouth 2 (two) times daily.  Marland Kitchen omeprazole (PRILOSEC) 40 MG capsule Take 40 mg by mouth daily.   . Pirfenidone (ESBRIET) 267 MG CAPS Take 3 capsules by mouth 3 (three) times daily. (Patient not taking: Reported on 05/18/2020)  . Pirfenidone 267 MG CAPS Take 1 cap TID x7 days, 2 caps TID x7 days, then 3 cap TID thereafter (Patient not taking: Reported on 05/18/2020)  . potassium chloride SA (K-DUR,KLOR-CON) 20 MEQ tablet Take 20 mEq by mouth daily.  . pramipexole (MIRAPEX) 0.125 MG tablet Take 0.125-0.25 mg by mouth See admin instructions. Take 0.125 mg by mouth in the morning and take 0.25 mg by mouth at bedtime.  . [DISCONTINUED] HYDROcodone-acetaminophen (NORCO/VICODIN) 5-325 MG tablet Take 1 tablet by mouth every 6 (six) hours as needed.  . [DISCONTINUED] traMADol (ULTRAM) 50 MG tablet Take 1-2 tablets (50-100 mg total) by mouth every 6 (six) hours as needed.   No facility-administered encounter medications  on file as of 05/18/2020.   Physical Exam: Blood pressure 138/62, pulse 100, temperature 98.2 F (36.8 C), temperature source Temporal, height _0  (1.727 m), weight 165 lb (74.8 kg), SpO2 95 %. Gen:      No acute distress HEENT:  EOMI, sclera anicteric Neck:     No masses; no thyromegaly Lungs:    Bibasal crackles CV:         Regular rate and rhythm; no murmurs Abd:      + bowel sounds; soft, non-tender; no palpable masses,  no distension Ext:    No edema; adequate peripheral perfusion Skin:      Warm and dry; no rash Neuro: alert and oriented x 3 Psych: normal mood and affect  Data Reviewed: Imaging: CT abdomen pelvis 06/23/2014-mild scarring at the base.  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  Mild lymphadenopathy, 4.1 cm ascending thoracic erythema.  High-res CT 05/13/2020-UIP pattern pulmonary fibrosis, reactive mediastinal lymphadenopathy, thoracic aortic aneurysm, enlarged PA I have reviewed the images personally.  PFTs 05/29/2019 FVC 2.63 [68%], FEV1 2.42 [88%], F/F 92, TLC 4.06 [61%], DLCO 13.80 [59%] Moderate-severe restriction and diffusion defect.  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 Significantly progressed since 2016.  He does not have any signs and symptoms of connective tissue disease or exposures.  Has borderline ANA which is nonspecific.  Exposure history is is not significant as he had minimal work with metal grinding which is more of a hobby  He has not tolerated Ofev and Esbriet I will will refer him to clinical trials for IPF Pulmonary rehab is limited by mobility issues due to hip bursitis We discussed lung transplant but he is not interested in this.  Enlarged PA Get echocardiogram for further evaluation.  proBNP in December 2021 was normal.  Plan/Recommendations: - Referral for clinical trials - Echocardiogram  Marshell Garfinkel MD [facility] 05/18/2020, 11:35 AM  CC: [name], PA

## 2020-05-18 NOTE — Addendum Note (Signed)
Addended by: Elton Sin on: 05/18/2020 11:59 AM   Modules accepted: Orders

## 2020-05-18 NOTE — Progress Notes (Signed)
Full PFT performed today.

## 2020-05-18 NOTE — Patient Instructions (Signed)
Sorry that you are unable to tolerate the medications for scarring in the lung We will see if you are eligible for any clinical trials for IPF Order echocardiogram for evaluation of pulmonary hypertension  Follow-up in 2 months.

## 2020-05-21 DIAGNOSIS — N289 Disorder of kidney and ureter, unspecified: Secondary | ICD-10-CM | POA: Diagnosis not present

## 2020-05-21 DIAGNOSIS — C9110 Chronic lymphocytic leukemia of B-cell type not having achieved remission: Secondary | ICD-10-CM | POA: Diagnosis not present

## 2020-05-21 DIAGNOSIS — D508 Other iron deficiency anemias: Secondary | ICD-10-CM | POA: Diagnosis not present

## 2020-05-25 ENCOUNTER — Ambulatory Visit: Payer: PPO | Admitting: Orthopaedic Surgery

## 2020-05-25 DIAGNOSIS — M25512 Pain in left shoulder: Secondary | ICD-10-CM | POA: Diagnosis not present

## 2020-05-25 DIAGNOSIS — G8929 Other chronic pain: Secondary | ICD-10-CM | POA: Diagnosis not present

## 2020-05-25 DIAGNOSIS — M7062 Trochanteric bursitis, left hip: Secondary | ICD-10-CM | POA: Diagnosis not present

## 2020-05-25 NOTE — Progress Notes (Signed)
The patient is continue to follow-up with chronic left shoulder pain and hip bursitis.  He is 80 years old.  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  His breathing is getting worse.  I believe he is not a surgical candidate at this standpoint because surgery on his shoulder would require him having to have general anesthesia and he does have worse breathing issues.  He may be involved in a clinical trial for his lungs coming up.  He says this is pain he thinks he can just live with.  Examination of his left shoulder does show stiffness with that shoulder with decreased motion and pain at the [facility] joint and the subacromial outlet as well as subdeltoid pain.  There is likely deficits of the rotator cuff as well.  His left hip is moving more normal.  His right hip has a history of a previous hemiarthroplasty.  There is bursitis around both his hips.  At this point he will continue outpatient physical therapy.  Follow-up from my standpoint can be as needed.  All question concerns were answered and addressed.

## 2020-05-26 DIAGNOSIS — M7072 Other bursitis of hip, left hip: Secondary | ICD-10-CM | POA: Diagnosis not present

## 2020-05-26 DIAGNOSIS — M25652 Stiffness of left hip, not elsewhere classified: Secondary | ICD-10-CM | POA: Diagnosis not present

## 2020-05-26 DIAGNOSIS — M25552 Pain in left hip: Secondary | ICD-10-CM | POA: Diagnosis not present

## 2020-05-26 DIAGNOSIS — R2689 Other abnormalities of gait and mobility: Secondary | ICD-10-CM | POA: Diagnosis not present

## 2020-05-27 DIAGNOSIS — Z006 Encounter for examination for normal comparison and control in clinical research program: Secondary | ICD-10-CM

## 2020-05-27 DIAGNOSIS — J84112 Idiopathic pulmonary fibrosis: Secondary | ICD-10-CM

## 2020-05-27 NOTE — Research (Signed)
Title: FGCL-3019-091 (Fibro[employer] Study) is a Phase 3, randomized, double-blind, placebo-controlled multicenter international study to evaluate evaluate the efficacy and safety of 30 mg/kg IV infusions of pamrevlumab administered every 3 weeks for 52 weeks as compared to placebo in subjects with Idiopathic Pulmonary Fibrosis. Primary end point is: change in FVC from baseline at week 52. ZEPHYRUS STUDY  Protocol #: [id number], Clinical Trials #: [id number] Sponsor: [email or web address]  ([place], [place], [place])  Protocol Version for 05/27/2020 date of  Amendment 5.0 (26AUG2021) Consent Version for 05/27/2020 date of Amendment 5 (21JUL2021) Investigator Brochure Version for 05/27/2020 date of Version [id number])   Key Features of Pamrevlumab (FG-3019) the study drug: a recombinant fully human IgG kappa monoclonal antibody that binds to CTGF and is being developed for treatment of diseases in which tissue fibrosis has a major pathogenic role. In particular, pamrevlumab appears to disrupt a CTGF autocrine loop in mesenchymal cells like myofibroblasts that reduces their recruitment of leukocytes like macrophages, mast and dendritic cells via chemokine secretion. This disruption results in collapse of the cellular crosstalk that drives tissue remodeling.  Key Inclusion Criteria:  Age 88 to 31 years Diagnosis of IPF within the past 5 years  Interstitial pulmonary fibrosis defined by HRCT scan at Screening, with evidence of ?10% to <50% parenchymal fibrosis and <25% honeycombing, within the whole lung. Not currently receiving treatment for IPF with approved therapy. a. FVC value ?50% and ?90% of predicted at screening b. DLCO percent of predicted and corrected by Hb value ?30% and ?90% at screening The extent of fibrosis is greater than the extent of emphysema on HRCT. Male subjects with partners of childbearing potential and male subjects of childbearing potential (including those <1 year postmenopausal)  must use double barrier contraception methods during conduct of study, and for 3 months after last dose of study drug.   Key Exclusion Criteria  Ongoing acute IPF exacerbation, or suspicion of such process, during Screening or Randomization Interstitial lung disease other than IPF; Poorly controlled chronic heart failure; clinical diagnosis of cor pulmonale requiring specific treatment; or severe pulmonary arterial hypertension Smoking within 3 months of Screening and/or unwilling to avoid smoking throughout the study. Use of any investigational drugs, for IPF or not, in the 30 days prior to screening initiation. Or use of approved IPF therapies within 5 half-lives of screening. High likelihood of lung transplantation within 6 months after Day 1. Any history of malignancy likely to result in significant disability or mortality likely to require significant medical or surgical intervention within the next 2 years. This does not include minor surgical procedures for localized cancer. Previous exposure to pamrevlumab. Daily use of PDE-5 inhibitor drugs [e.g. sildenafil, tadalafil, other]. (Note: Intermittent use of one type for erectile dysfunction or severe pulmonary hypertension is allowed).    Mechanism of Action: Pamrevlumab is a human recombinant IgG monoclonal antibody that binds to connective tissue growth factor (CTGF).  CTGF plays a role by mediating the process of fibrosis. By binding to CTGF, pamrevlumab blocks its biologic activity; thereby preventing cell proliferation, adhesion and migration of growth factors involved in fibrotic changes. It is also being studied in other conditions where fibrotic changes play a role; liver fibrosis, Duchenne muscular dystrophy and certain cancers.   Half-life: 58-141 hours; t1/2 increases with increasing doses   Interactions No known drug interactions. All concomitant medications to be reviewed by PI.     Safety Data Amendment 1.0. 17 June 2017 624 subjects have been exposed to pamrevlumab, 270 with IPF  The most common TEAEs in all subjects with IPF: Cough, fatigue, dyspnea, upper respiratory tract infection, bronchitis, nasopharyngitis No known effect on qtc prolongation, renal or hepatic issues   Phase 1 study Study [id number], n=21 Enrolled 21 subjects with IPF No dose-limiting toxicities All adverse events were considered mild to moderate 76% of subjects experienced at least 1 TEAE The most common TEAEs: Pyrexia (n=3, 14% of subjects) Cough (n=3, 14% of subjects) Dyspnea (n=3, 14% of subjects) Respiratory tract infection (n=2, 9% of subjects)   Phase 2 study Study [id number], n=90 Enrolled 90 subjects with IPF 14 deaths occurred, 13 were deemed to be related to IPF 20% of subjects experienced a TEAE that led to study drug discontinuation IPF and respiratory failure were the two most common reasons for discontinuation; occurring in 8% and 3% of patients, respectively The most common TEAEs: Cough (n=34, 38% of subjects) Dyspnea (n=24, 27% of subjects) Fatigue (n=24, 27% of subjects) Nasopharyngitis (n=20, 22% of subjects) Respiratory tract infection (n=19, 21% of subjects) Bronchitis (n=18, 20% of subjects)   Phase 2 study Study [id number], n=103 103 subjects enrolled with IPF 9 deaths occurred; 4 deemed related to IPF, 5 related to other respiratory causes 18% of subjects experienced a TEAE that led to study drug discontinuation The most common TEAEs: Cough (n=48, 47% of subjects) Respiratory tract infection (n=39, 38% of subjects) IPF (n=32, 31% of subjects) Dysnpea (n=30, 29% of subjects) Sinusitis (n=21, 21% of subjects) Fatigue (n=20, 19% of subjects)   Xxxxxxxxxxxxxxxxxxxxx [facility] meeting Aug 2021 - cleared to continue Xxxxxxxxxxxxxx Updates - Sept 2021 - 1 anaphylactic reaction on 10th infusion on [place] patient [place] - causally related - 2.04% incidence of PE (between IPF 6 patients,  and 8 with pancreatic cancer of 685 in studye with 562 thought to be on IP) - possible cause by IP but sponsor believes no changes to study program xxxxxxxxxxxxxxxxxxxxxxxxxxxxxxxx   Overall, pamrevlumab has been well tolerated. Infusion-related reactions have been reported at a rate that is consistent with other human monoclonal antibodies. Based on the mechanism of action of pamrevlumab, by inhibiting CTGF, there was some concern that this would cause impaired wound healing or impaired bone fracture healing. However, there were no serious adverse events reported in any study relating to these two issues.    Clinical Research Coordinator / Research RN note : This visit for Subject Jeffrey Brennan with DOB: 1940-09-02 on 05/27/2020 for the above protocol is Visit/Encounter #Screening and is for purpose of research.   The consent for this encounter is under Protocol Version Amendment 5.0 (26AUG2021), Investigator Brochure Edition Version [id number]) Consent Version Amendment 5 [phone])  and is currently IRB approved.   Subject expressed continued interest and consent in continuing as a study subject. Subject confirmed that there was no change in contact information (e.g. address, telephone, email). Subject thanked for participation in research and contribution to science.  In this visit 05/27/2020,the subjectwas consentedandevaluated bysub-investigator named Dr. Unice Cobble. This research coordinator has verified that the investigatorisup to date withhistraining. The subject was made aware of the above mentioned updates prior to consent. Subject verbalized understanding.  The subject presented to clinic today to consent to the above referenced protocol. The subject was made aware by the research coordinator,Kamdyn Covel, that this is an investigational study and thathisparticipationiscompletelyvoluntary. The subject was given ample time to review the ICF document. The study  coordinator and Dr. Linna Darner ensuredall questions were answered to the subject's satisfaction. After obtainingthesubject's signed consent, all screening assessments  performed today were conducted as per the above referenced protocol. For further information regardingtoday'sscreening visit,please refer to subject's paper source binder.   Signed by [place] Assistant [facility]  [place], [place] 12:19 PM 05/27/2020

## 2020-05-30 NOTE — Progress Notes (Signed)
Jeffrey Brennan, date of birth 1941-03-04, Subject Study ID # [id number] was examined on 27 May 2020. There was no change in his medical history and he was at his baseline. Positive physical findings included alternating esotropia.  The right tympanic membrane was scarred and deformed.  He had isolated patches of dry, erythematous skin over the shins which did not blanch to pressure.  He had coarse rales over the lower two thirds of the posterior thorax bilaterally.  Rales were asymmetric, greater on the right than the left, anteriorly.  Both first and second heart sounds were accentuated.  He had 1/2+ edema at the sock line. There were isolated DIP and PIP enlargements of fingers. Jeffrey Brennan. Linna Darner, MD, SI

## 2020-06-04 DIAGNOSIS — E8809 Other disorders of plasma-protein metabolism, not elsewhere classified: Secondary | ICD-10-CM | POA: Insufficient documentation

## 2020-06-07 ENCOUNTER — Telehealth: Payer: Self-pay | Admitting: Pulmonary Disease

## 2020-06-07 DIAGNOSIS — J84112 Idiopathic pulmonary fibrosis: Secondary | ICD-10-CM

## 2020-06-07 MED ORDER — AZITHROMYCIN 250 MG PO TABS: ORAL_TABLET | ORAL | 0 refills | Status: DC

## 2020-06-07 NOTE — Telephone Encounter (Signed)
Called and spoke with patient. I asked him if he remembered taking any research medications on the 2/24, he stated that he did not.   He has agreed to take the zpak. I explained to him the instructions, he verbalized understanding.   When I explained to him that Dr. Vaughan Browner wanted a CXR, he stated that he just had a CT the week before his appt. From his notes, it looks like this was discussed during his last OV. He wanted to make sure a CXR was needed. I explained that we needed a CXR to make sure he does not have PNA or bronchitis.

## 2020-06-07 NOTE — Telephone Encounter (Signed)
I am looking through the notes and I am not sure if he did receive the study drug on 2/24. Will cc the research team so they are aware.  Symptoms sound like he has bronchitis or pneumonia Please call in a Z-Pak, ask him to come in for a chest x ray

## 2020-06-07 NOTE — Telephone Encounter (Signed)
Called and spoke with patient. He stated that since his research visit on 05/27/20 He attends physical therapy for his hip and his therapist noticed that during his exercises, his O2 will drop into the 80s. After sitting still for about 5 minutes, his O2 will recover back into the lower to mid 90s.   He has also noticed that his coughing has increased. He is coughing up yellowish phlegm. A few weeks ago, he was coughing up clear phlegm.   He denied any fevers or body aches.   Pharmacy is [facility].   Dr. Vaughan Browner, can you please advise? Thanks!

## 2020-06-07 NOTE — Telephone Encounter (Signed)
Thanks. So patient is in screening phase and has not yet been randomized. During pre=screen we had some concern that his CLL stage 0 might or might not be an exclusion . Sponsor at that point was ok with it. The sponsor got back today after reviwing  saying it will be an exclusion because per their protocol it is not considered "localized cancer". We will inform  Jeffrey Brennan   Standard of care per Dr Laney Potash    Dr. Brand Males, M.D., F.C.C.P,  Pulmonary and Critical Care Medicine Staff Physician, [place] Director - Interstitial Lung Disease  Program  Pulmonary [place] at [place], [place], [address]  Pager: [phone], If no answer  OR between  19:00-7:00h: page [phone] Telephone (clinical office): [phone] Telephone (research): [phone]  3:45 PM 06/07/2020

## 2020-06-07 NOTE — Telephone Encounter (Signed)
Called and spoke with pt letting him know why Dr. Vaughan Browner wanted a cxr to be done and he verbalized understanding. Order has been placed for the cxr. Told pt to come tomorrow 3/8 due to work being done on xray machine today 3/7 and he verbalized understanding. Nothing further needed.

## 2020-06-08 ENCOUNTER — Ambulatory Visit (INDEPENDENT_AMBULATORY_CARE_PROVIDER_SITE_OTHER): Payer: PPO

## 2020-06-08 DIAGNOSIS — J8410 Pulmonary fibrosis, unspecified: Secondary | ICD-10-CM | POA: Diagnosis not present

## 2020-06-08 DIAGNOSIS — J84112 Idiopathic pulmonary fibrosis: Secondary | ICD-10-CM

## 2020-06-08 DIAGNOSIS — R0602 Shortness of breath: Secondary | ICD-10-CM | POA: Diagnosis not present

## 2020-06-08 DIAGNOSIS — R059 Cough, unspecified: Secondary | ICD-10-CM | POA: Diagnosis not present

## 2020-06-08 DIAGNOSIS — J189 Pneumonia, unspecified organism: Secondary | ICD-10-CM | POA: Diagnosis not present

## 2020-06-11 ENCOUNTER — Other Ambulatory Visit: Payer: Self-pay

## 2020-06-11 ENCOUNTER — Telehealth: Payer: Self-pay | Admitting: Pulmonary Disease

## 2020-06-11 ENCOUNTER — Encounter (HOSPITAL_COMMUNITY): Payer: Self-pay | Admitting: Emergency Medicine

## 2020-06-11 ENCOUNTER — Emergency Department (HOSPITAL_COMMUNITY): Payer: PPO

## 2020-06-11 ENCOUNTER — Inpatient Hospital Stay (HOSPITAL_COMMUNITY)
Admission: EM | Admit: 2020-06-11 | Discharge: 2020-06-14 | DRG: 871 | Disposition: A | Discharge status: Home / Self Care | Payer: PPO | Attending: Family Medicine | Admitting: Family Medicine

## 2020-06-11 DIAGNOSIS — Z7984 Long term (current) use of oral hypoglycemic drugs: Secondary | ICD-10-CM

## 2020-06-11 DIAGNOSIS — N1832 Chronic kidney disease, stage 3b: Secondary | ICD-10-CM | POA: Diagnosis not present

## 2020-06-11 DIAGNOSIS — D72829 Elevated white blood cell count, unspecified: Secondary | ICD-10-CM | POA: Diagnosis not present

## 2020-06-11 DIAGNOSIS — J84112 Idiopathic pulmonary fibrosis: Secondary | ICD-10-CM | POA: Diagnosis not present

## 2020-06-11 DIAGNOSIS — Z96641 Presence of right artificial hip joint: Secondary | ICD-10-CM | POA: Diagnosis not present

## 2020-06-11 DIAGNOSIS — M109 Gout, unspecified: Secondary | ICD-10-CM | POA: Diagnosis not present

## 2020-06-11 DIAGNOSIS — Z79899 Other long term (current) drug therapy: Secondary | ICD-10-CM

## 2020-06-11 DIAGNOSIS — R059 Cough, unspecified: Secondary | ICD-10-CM | POA: Diagnosis not present

## 2020-06-11 DIAGNOSIS — Z856 Personal history of leukemia: Secondary | ICD-10-CM

## 2020-06-11 DIAGNOSIS — N189 Chronic kidney disease, unspecified: Secondary | ICD-10-CM | POA: Diagnosis not present

## 2020-06-11 DIAGNOSIS — I129 Hypertensive chronic kidney disease with stage 1 through stage 4 chronic kidney disease, or unspecified chronic kidney disease: Secondary | ICD-10-CM | POA: Diagnosis present

## 2020-06-11 DIAGNOSIS — E1165 Type 2 diabetes mellitus with hyperglycemia: Secondary | ICD-10-CM | POA: Diagnosis not present

## 2020-06-11 DIAGNOSIS — Z87891 Personal history of nicotine dependence: Secondary | ICD-10-CM

## 2020-06-11 DIAGNOSIS — R0602 Shortness of breath: Secondary | ICD-10-CM | POA: Diagnosis not present

## 2020-06-11 DIAGNOSIS — Z881 Allergy status to other antibiotic agents status: Secondary | ICD-10-CM | POA: Diagnosis not present

## 2020-06-11 DIAGNOSIS — J189 Pneumonia, unspecified organism: Secondary | ICD-10-CM | POA: Diagnosis present

## 2020-06-11 DIAGNOSIS — Z888 Allergy status to other drugs, medicaments and biological substances status: Secondary | ICD-10-CM

## 2020-06-11 DIAGNOSIS — Z20822 Contact with and (suspected) exposure to covid-19: Secondary | ICD-10-CM | POA: Diagnosis present

## 2020-06-11 DIAGNOSIS — R6520 Severe sepsis without septic shock: Secondary | ICD-10-CM | POA: Diagnosis not present

## 2020-06-11 DIAGNOSIS — I10 Essential (primary) hypertension: Secondary | ICD-10-CM

## 2020-06-11 DIAGNOSIS — J9601 Acute respiratory failure with hypoxia: Secondary | ICD-10-CM | POA: Diagnosis present

## 2020-06-11 DIAGNOSIS — E1122 Type 2 diabetes mellitus with diabetic chronic kidney disease: Secondary | ICD-10-CM | POA: Diagnosis not present

## 2020-06-11 DIAGNOSIS — Z8709 Personal history of other diseases of the respiratory system: Secondary | ICD-10-CM

## 2020-06-11 DIAGNOSIS — A419 Sepsis, unspecified organism: Secondary | ICD-10-CM | POA: Diagnosis present

## 2020-06-11 DIAGNOSIS — N183 Chronic kidney disease, stage 3 unspecified: Secondary | ICD-10-CM

## 2020-06-11 DIAGNOSIS — K219 Gastro-esophageal reflux disease without esophagitis: Secondary | ICD-10-CM | POA: Diagnosis not present

## 2020-06-11 DIAGNOSIS — Z883 Allergy status to other anti-infective agents status: Secondary | ICD-10-CM

## 2020-06-11 DIAGNOSIS — J40 Bronchitis, not specified as acute or chronic: Secondary | ICD-10-CM | POA: Diagnosis present

## 2020-06-11 DIAGNOSIS — E871 Hypo-osmolality and hyponatremia: Secondary | ICD-10-CM

## 2020-06-11 DIAGNOSIS — J8410 Pulmonary fibrosis, unspecified: Secondary | ICD-10-CM | POA: Diagnosis not present

## 2020-06-11 LAB — TROPONIN I (HIGH SENSITIVITY)
Troponin I (High Sensitivity): 8 ng/L (ref <18)
Troponin I (High Sensitivity): 6 ng/L (ref <18)

## 2020-06-11 LAB — RESP PANEL BY RT-PCR (FLU A&B, COVID) ARPGX2
Influenza A by PCR: NEGATIVE
Influenza B by PCR: NEGATIVE
SARS Coronavirus 2 by RT PCR: NEGATIVE

## 2020-06-11 LAB — BASIC METABOLIC PANEL
Anion gap: 12 (ref 5–15)
BUN: 38 mg/dL — ABNORMAL HIGH (ref 8–23)
CO2: 21 mmol/L — ABNORMAL LOW (ref 22–32)
Calcium: 8.9 mg/dL (ref 8.9–10.3)
Chloride: 100 mmol/L (ref 98–111)
Creatinine, Ser: 1.8 mg/dL — ABNORMAL HIGH (ref 0.61–1.24)
GFR, Estimated: 38 mL/min — ABNORMAL LOW (ref >60)
Glucose, Bld: 155 mg/dL — ABNORMAL HIGH (ref 70–99)
Potassium: 3.6 mmol/L (ref 3.5–5.1)
Sodium: 133 mmol/L — ABNORMAL LOW (ref 135–145)

## 2020-06-11 LAB — CBC WITH DIFFERENTIAL/PLATELET
Abs Immature Granulocytes: 0.06 10*3/uL (ref 0.00–0.07)
Basophils Absolute: 0.1 10*3/uL (ref 0.0–0.1)
Basophils Relative: 1 %
Eosinophils Absolute: 0.9 10*3/uL — ABNORMAL HIGH (ref 0.0–0.5)
Eosinophils Relative: 4 %
HCT: 37.7 % — ABNORMAL LOW (ref 39.0–52.0)
Hemoglobin: 12.5 g/dL — ABNORMAL LOW (ref 13.0–17.0)
Immature Granulocytes: 0 %
Lymphocytes Relative: 61 %
Lymphs Abs: 13.4 10*3/uL — ABNORMAL HIGH (ref 0.7–4.0)
MCH: 28.9 pg (ref 26.0–34.0)
MCHC: 33.2 g/dL (ref 30.0–36.0)
MCV: 87.3 fL (ref 80.0–100.0)
Monocytes Absolute: 0.8 10*3/uL (ref 0.1–1.0)
Monocytes Relative: 4 %
Neutro Abs: 6.5 10*3/uL (ref 1.7–7.7)
Neutrophils Relative %: 30 %
Platelets: 263 10*3/uL (ref 150–400)
RBC: 4.32 MIL/uL (ref 4.22–5.81)
RDW: 16.2 % — ABNORMAL HIGH (ref 11.5–15.5)
WBC: 21.7 10*3/uL — ABNORMAL HIGH (ref 4.0–10.5)
nRBC: 0 % (ref 0.0–0.2)

## 2020-06-11 LAB — LACTIC ACID, PLASMA
Lactic Acid, Venous: 1.3 mmol/L (ref 0.5–1.9)
Lactic Acid, Venous: 1.2 mmol/L (ref 0.5–1.9)

## 2020-06-11 LAB — BRAIN NATRIURETIC PEPTIDE: B Natriuretic Peptide: 38 pg/mL (ref 0.0–100.0)

## 2020-06-11 MED ORDER — SODIUM CHLORIDE 0.9 % IV SOLN
500.0000 mg | INTRAVENOUS | Status: DC
  Administered 2020-06-11 – 2020-06-13 (×3): 500 mg via INTRAVENOUS
  Filled 2020-06-11 (×3): qty 500

## 2020-06-11 MED ORDER — DEXAMETHASONE SODIUM PHOSPHATE 10 MG/ML IJ SOLN
10.0000 mg | Freq: Once | INTRAMUSCULAR | Status: AC
  Administered 2020-06-11: 10 mg via INTRAVENOUS
  Filled 2020-06-11: qty 1

## 2020-06-11 MED ORDER — ENOXAPARIN SODIUM 40 MG/0.4ML ~~LOC~~ SOLN
40.0000 mg | SUBCUTANEOUS | Status: DC
  Administered 2020-06-12 – 2020-06-14 (×3): 40 mg via SUBCUTANEOUS
  Filled 2020-06-11 (×3): qty 0.4

## 2020-06-11 MED ORDER — SODIUM CHLORIDE 0.9 % IV SOLN
2.0000 g | Freq: Once | INTRAVENOUS | Status: AC
  Administered 2020-06-11: 2 g via INTRAVENOUS
  Filled 2020-06-11: qty 20
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
## 2020-06-11 NOTE — Telephone Encounter (Signed)
Called and spoke with patient to let him know of Dr. Harlow Asa recs and that he agreed with him needing to go to the ED for further evaluation. Patient expressed understanding and said that if he went he would go to [facility]. Advised patient to let us know if he needs anything else or keep [place] updated. He expressed understanding. Nothing further needed at this time.

## 2020-06-11 NOTE — Telephone Encounter (Signed)
Chest x-ray shows chronic fibrosis.  There is no clear evidence of pneumonia We had already given him a Z-Pak and if he is worse then agree with ED evaluation

## 2020-06-11 NOTE — Telephone Encounter (Signed)
Called spoke with patient.  Advised that he go to the emergency room and not drive himself but call 911 if he oxygen is in the 70s and 80s. Patient states he could get ahold of some oxygen, I told him to call 911 he would need to be evaluated especially if his heart rate is all over the place and he is very short of breath.   Will route to Dr. Vaughan Browner as Juluis Rainier

## 2020-06-11 NOTE — ED Notes (Signed)
Pt's oxygen dropped to 79% with a HR of 102 while ambulating. Pt made it approx. 1/4 of nurses station. Pt taken back to room, placed on oxygen, and recovered. EDP notified.

## 2020-06-11 NOTE — ED Triage Notes (Signed)
Pt states that he has fibrosis, states he called the nurse on call at his PCP and she told him to come be seen.

## 2020-06-11 NOTE — ED Notes (Signed)
Pt's pocket knife secured with security

## 2020-06-11 NOTE — Telephone Encounter (Signed)
Patient has called stating he is very short of breath heart rate all over the place and oxygen sats very low. I have told him to call 911 as mentioned in other telephone encounter.  Dr. Vaughan Browner patient is wondering about his chest xray from 06/08/20 Sending to Dr. Vaughan Browner for further recommendations

## 2020-06-12 DIAGNOSIS — R6520 Severe sepsis without septic shock: Secondary | ICD-10-CM

## 2020-06-12 DIAGNOSIS — N1832 Chronic kidney disease, stage 3b: Secondary | ICD-10-CM

## 2020-06-12 DIAGNOSIS — A419 Sepsis, unspecified organism: Principal | ICD-10-CM | POA: Diagnosis present

## 2020-06-12 DIAGNOSIS — J189 Pneumonia, unspecified organism: Secondary | ICD-10-CM

## 2020-06-12 DIAGNOSIS — J9601 Acute respiratory failure with hypoxia: Secondary | ICD-10-CM

## 2020-06-12 LAB — COMPREHENSIVE METABOLIC PANEL
ALT: 15 U/L (ref 0–44)
AST: 15 U/L (ref 15–41)
Albumin: 3.3 g/dL — ABNORMAL LOW (ref 3.5–5.0)
Alkaline Phosphatase: 81 U/L (ref 38–126)
Anion gap: 11 (ref 5–15)
BUN: 41 mg/dL — ABNORMAL HIGH (ref 8–23)
CO2: 19 mmol/L — ABNORMAL LOW (ref 22–32)
Calcium: 9.3 mg/dL (ref 8.9–10.3)
Chloride: 103 mmol/L (ref 98–111)
Creatinine, Ser: 1.76 mg/dL — ABNORMAL HIGH (ref 0.61–1.24)
GFR, Estimated: 39 mL/min — ABNORMAL LOW (ref >60)
Glucose, Bld: 204 mg/dL — ABNORMAL HIGH (ref 70–99)
Potassium: 4.3 mmol/L (ref 3.5–5.1)
Sodium: 133 mmol/L — ABNORMAL LOW (ref 135–145)
Total Bilirubin: 0.5 mg/dL (ref 0.3–1.2)
Total Protein: 6.3 g/dL — ABNORMAL LOW (ref 6.5–8.1)

## 2020-06-12 LAB — GLUCOSE, CAPILLARY
Glucose-Capillary: 174 mg/dL — ABNORMAL HIGH (ref 70–99)
Glucose-Capillary: 197 mg/dL — ABNORMAL HIGH (ref 70–99)
Glucose-Capillary: 164 mg/dL — ABNORMAL HIGH (ref 70–99)
Glucose-Capillary: 194 mg/dL — ABNORMAL HIGH (ref 70–99)
Glucose-Capillary: 87 mg/dL (ref 70–99)

## 2020-06-12 LAB — HEMOGLOBIN A1C
Hgb A1c MFr Bld: 7 % — ABNORMAL HIGH (ref 4.8–5.6)
Mean Plasma Glucose: 154.2 mg/dL

## 2020-06-12 LAB — APTT: aPTT: 34 seconds (ref 24–36)

## 2020-06-12 LAB — CBC
HCT: 37.3 % — ABNORMAL LOW (ref 39.0–52.0)
Hemoglobin: 12.2 g/dL — ABNORMAL LOW (ref 13.0–17.0)
MCH: 28.8 pg (ref 26.0–34.0)
MCHC: 32.7 g/dL (ref 30.0–36.0)
MCV: 88.2 fL (ref 80.0–100.0)
Platelets: 251 10*3/uL (ref 150–400)
RBC: 4.23 MIL/uL (ref 4.22–5.81)
RDW: 16.2 % — ABNORMAL HIGH (ref 11.5–15.5)
WBC: 19.6 10*3/uL — ABNORMAL HIGH (ref 4.0–10.5)
nRBC: 0 % (ref 0.0–0.2)

## 2020-06-12 LAB — PROTIME-INR
INR: 1.1 (ref 0.8–1.2)
Prothrombin Time: 13.3 seconds (ref 11.4–15.2)

## 2020-06-12 LAB — PHOSPHORUS: Phosphorus: 3 mg/dL (ref 2.5–4.6)

## 2020-06-12 LAB — MAGNESIUM: Magnesium: 2.2 mg/dL (ref 1.7–2.4)

## 2020-06-12 LAB — PROCALCITONIN: Procalcitonin: <0.1 ng/mL

## 2020-06-12 MED ORDER — GUAIFENESIN ER 600 MG PO TB12
600.0000 mg | ORAL_TABLET | Freq: Two times a day (BID) | ORAL | Status: DC
  Administered 2020-06-12 – 2020-06-14 (×4): 600 mg via ORAL
  Filled 2020-06-12 (×4): qty 1

## 2020-06-12 MED ORDER — INSULIN ASPART 100 UNIT/ML ~~LOC~~ SOLN
0.0000 [IU] | Freq: Three times a day (TID) | SUBCUTANEOUS | Status: DC
  Administered 2020-06-12 (×3): 2 [IU] via SUBCUTANEOUS
  Administered 2020-06-13: 1 [IU] via SUBCUTANEOUS

## 2020-06-12 MED ORDER — SODIUM CHLORIDE 0.9 % IV SOLN: 500.0000 mg | INTRAVENOUS | Status: DC

## 2020-06-12 MED ORDER — INSULIN ASPART 100 UNIT/ML ~~LOC~~ SOLN: 0.0000 [IU] | Freq: Every day | SUBCUTANEOUS | Status: DC

## 2020-06-12 MED ORDER — SODIUM CHLORIDE 0.9 % IV SOLN
1.0000 g | INTRAVENOUS | Status: DC
  Administered 2020-06-12 – 2020-06-14 (×3): 1 g via INTRAVENOUS
  Filled 2020-06-12 (×3): qty 10

## 2020-06-12 MED ORDER — ALBUTEROL SULFATE (2.5 MG/3ML) 0.083% IN NEBU: 2.5000 mg | INHALATION_SOLUTION | RESPIRATORY_TRACT | Status: DC | PRN

## 2020-06-12 MED ORDER — IPRATROPIUM-ALBUTEROL 0.5-2.5 (3) MG/3ML IN SOLN
3.0000 mL | Freq: Three times a day (TID) | RESPIRATORY_TRACT | Status: DC
  Administered 2020-06-12 – 2020-06-13 (×2): 3 mL via RESPIRATORY_TRACT
  Filled 2020-06-12 (×3): qty 3

## 2020-06-12 MED ORDER — IPRATROPIUM-ALBUTEROL 0.5-2.5 (3) MG/3ML IN SOLN: 3.0000 mL | Freq: Three times a day (TID) | RESPIRATORY_TRACT | Status: DC

## 2020-06-12 MED ORDER — PANTOPRAZOLE SODIUM 40 MG PO TBEC
40.0000 mg | DELAYED_RELEASE_TABLET | Freq: Every day | ORAL | Status: DC
  Administered 2020-06-12 – 2020-06-14 (×3): 40 mg via ORAL
  Filled 2020-06-12 (×3): qty 1

## 2020-06-12 MED ORDER — PRAMIPEXOLE DIHYDROCHLORIDE 0.25 MG PO TABS
0.1250 mg | ORAL_TABLET | Freq: Two times a day (BID) | ORAL | Status: DC
  Administered 2020-06-12 – 2020-06-14 (×4): 0.125 mg via ORAL
  Filled 2020-06-12 (×8): qty 1
  Filled 2020-06-12: qty 0.5
  Filled 2020-06-12: qty 1

## 2020-06-12 MED ORDER — DM-GUAIFENESIN ER 30-600 MG PO TB12
1.0000 | ORAL_TABLET | Freq: Two times a day (BID) | ORAL | Status: DC
  Administered 2020-06-12 – 2020-06-14 (×6): 1 via ORAL
  Filled 2020-06-12 (×6): qty 1

## 2020-06-12 NOTE — Progress Notes (Signed)
Patient Demographics:    Jeffrey Brennan, is a 80 y.o. male, DOB - [birth year], VVO:[id number]  Admit date - 06/11/2020   Admitting Physician Bernadette Hoit, DO  Outpatient Primary MD for the patient is [name], PA  LOS - 1   Chief Complaint  Patient presents with  . Shortness of Breath        Subjective:    Jeffrey Brennan today has no fevers, no emesis,  No chest pain,   -Cough, shortness of breath and dyspnea on exertion as well as hypoxia persist  Assessment  & Plan :    Principal Problem:   CAP (community acquired pneumonia) Active Problems:   Acute respiratory failure with hypoxia ([place])   History of pulmonary fibrosis   Essential hypertension   Hyperglycemia due to diabetes mellitus (HCC)   CKD (chronic kidney disease), stage III (HCC)   Leukocytosis   Hyponatremia   GERD (gastroesophageal reflux disease)  Brief Summary:- 80 y.o. male with medical history significant for idiopathic pulmonary fibrosis (follows with Dr. 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 Hart Robinsons Mannam  6) acute hypoxic respiratory failure--- secondary to #1, #2 and #5 above --Continue supplemental oxygen, manage as above  Disposition/Need for in-Hospital Stay- patient unable to be discharged at this time due to --sepsis secondary to pneumonia with hypoxia requiring IV antibiotics*  Status is: Inpatient  Remains inpatient appropriate because:Please see above   Disposition: The patient is from: Home              Anticipated d/c is to: Home              Anticipated d/c date is: 2 days              Patient currently is not medically stable to d/c. Barriers: Not Clinically Stable-   Code Status :  -  Code Status: Full Code   Family Communication:   NA (patient is alert, awake and coherent)   Consults  :    DVT Prophylaxis  :   - SCDs  enoxaparin (LOVENOX) injection 40 mg Start: 06/12/20 0600 SCDs Start: 06/11/20 2350    Lab Results  Component Value Date   PLT 251 06/12/2020    Inpatient Medications  Scheduled Meds: . dextromethorphan-guaiFENesin  1 tablet Oral BID  . enoxaparin (LOVENOX) injection  40 mg Subcutaneous Q24H  . insulin aspart  0-5 Units Subcutaneous QHS  . insulin aspart  0-9 Units Subcutaneous TID WC  .  pantoprazole  40 mg Oral Daily  . pramipexole  0.125 mg Oral BID   Continuous Infusions: . azithromycin Stopped (06/11/20 2216)  . cefTRIAXone (ROCEPHIN)  IV 1 g (06/12/20 0827)   PRN Meds:.    Anti-infectives (From admission, onward)   Start     Dose/Rate Route Frequency Ordered Stop   06/12/20 1000  cefTRIAXone (ROCEPHIN) 1 g in sodium chloride 0.9 % 100 mL IVPB        1 g 200 mL/hr over 30 Minutes Intravenous Every 24 hours 06/12/20 0435     06/12/20 0530  azithromycin (ZITHROMAX) 500 mg in sodium chloride 0.9 % 250 mL IVPB  Status:  Discontinued        500 mg 250 mL/hr over 60 Minutes Intravenous Every 24 hours 06/12/20 0435 06/12/20 0435    06/11/20 2045  cefTRIAXone (ROCEPHIN) 2 g in sodium chloride 0.9 % 100 mL IVPB        2 g 200 mL/hr over 30 Minutes Intravenous  Once 06/11/20 2044 06/11/20 2154   06/11/20 2045  azithromycin (ZITHROMAX) 500 mg in sodium chloride 0.9 % 250 mL IVPB        500 mg 250 mL/hr over 60 Minutes Intravenous Every 24 hours 06/11/20 2044          Objective:   Vitals:   06/11/20 2350 06/11/20 2351 06/12/20 0404 06/12/20 1443  BP:  111/81 112/61 112/61  Pulse:  92 98 98  Resp:    18  Temp:  97.6 F (36.4 C) 98 F (36.7 C) 98.2 F (36.8 C)  TempSrc:  Oral Oral Oral  SpO2: 99% 99% 98% 97%  Weight:  75.7 kg    Height:        Wt Readings from Last 3 Encounters:  06/11/20 75.7 kg  05/18/20 74.8 kg  03/10/20 75 kg     Intake/Output Summary (Last 24 hours) at 06/12/2020 1649 Last data filed at 06/12/2020 1515 Gross per 24 hour  Intake 1411.73 ml  Output 400 ml  Net 1011.73 ml     Physical Exam  Gen:- Awake Alert, able to speak in sentences at rest but does have dyspnea on exertion HEENT:- [place].AT, No sclera icterus Nose- [place] 4L/min Neck-Supple Neck,No JVD,.  Lungs-diminished breath sounds, Velcro type brace, no wheezing  CV- S1, S2 normal, regular  Abd-  +ve B.Sounds, Abd Soft, No tenderness,    Extremity/Skin:- No  edema, pedal pulses present  Psych-affect is appropriate, oriented x3 Neuro-no new focal deficits, no tremors   Data Review:   Micro Results Recent Results (from the past 240 hour(s))  Resp Panel by RT-PCR (Flu A&B, Covid) Nasopharyngeal Swab     Status: None   Collection Time: 06/11/20  8:45 PM   Specimen: Nasopharyngeal Swab; Nasopharyngeal(NP) swabs in vial transport medium  Result Value Ref Range Status   SARS Coronavirus 2 by RT PCR NEGATIVE NEGATIVE Final    Comment: (NOTE) SARS-CoV-2 target nucleic acids are NOT DETECTED.  The SARS-CoV-2 RNA is generally detectable in upper respiratory specimens during the acute phase of infection. The  lowest concentration of SARS-CoV-2 viral copies this assay can detect is 138 copies/mL. A negative result does not preclude SARS-Cov-2 infection and should not be used as the sole basis for treatment or other patient management decisions. A negative result may occur with  improper specimen collection/handling, submission of specimen other than nasopharyngeal swab, presence of viral mutation(s) within the areas targeted by this assay, and inadequate number of viral copies(<138 copies/mL).  A negative result must be combined with clinical observations, patient history, and epidemiological information. The expected result is Negative.  Fact Sheet for Patients:  EntrepreneurPulse.com.au  Fact Sheet for Healthcare Providers:  IncredibleEmployment.be  This test is no t yet approved or cleared by the [place] FDA and  has been authorized for detection and/or diagnosis of SARS-CoV-2 by FDA under an Emergency Use Authorization (EUA). This EUA will remain  in effect (meaning this test can be used) for the duration of the COVID-19 declaration under Section 564(b)(1) of the Act, 21 U.S.C.section 360bbb-3(b)(1), unless the authorization is terminated  or revoked sooner.       Influenza A by PCR NEGATIVE NEGATIVE Final   Influenza B by PCR NEGATIVE NEGATIVE Final    Comment: (NOTE) The Xpert Xpress SARS-CoV-2/FLU/RSV plus assay is intended as an aid in the diagnosis of influenza from Nasopharyngeal swab specimens and should not be used as a sole basis for treatment. Nasal washings and aspirates are unacceptable for Xpert Xpress SARS-CoV-2/FLU/RSV testing.  Fact Sheet for Patients: EntrepreneurPulse.com.au  Fact Sheet for Healthcare Providers: IncredibleEmployment.be  This test is not yet approved or cleared by the [place] FDA and has been authorized for detection and/or diagnosis of SARS-CoV-2 by FDA under  an Emergency Use Authorization (EUA). This EUA will remain in effect (meaning this test can be used) for the duration of the COVID-19 declaration under Section 564(b)(1) of the Act, 21 U.S.C. section 360bbb-3(b)(1), unless the authorization is terminated or revoked.  Performed at [facility], [address] [place], [place] [address]   Culture, blood (routine x 2)     Status: None (Preliminary result)   Collection Time: 06/11/20  9:06 PM   Specimen: Left Antecubital; Blood  Result Value Ref Range Status   Specimen Description LEFT ANTECUBITAL  Final   Special Requests   Final    BOTTLES DRAWN AEROBIC AND ANAEROBIC Blood Culture adequate volume   Culture   Final    NO GROWTH < 12 HOURS Performed at [facility], [address] [place], [place] [address]    Report Status PENDING  Incomplete  Culture, blood (routine x 2)     Status: None (Preliminary result)   Collection Time: 06/11/20  9:07 PM   Specimen: Right Antecubital; Blood  Result Value Ref Range Status   Specimen Description RIGHT ANTECUBITAL  Final   Special Requests   Final    BOTTLES DRAWN AEROBIC AND ANAEROBIC Blood Culture results may not be optimal due to an excessive volume of blood received in culture bottles   Culture   Final    NO GROWTH < 12 HOURS Performed at [facility], [address] [place], [place] [address]    Report Status PENDING  Incomplete    Radiology Reports DG Chest 2 View  Result Date: 06/11/2020 CLINICAL DATA:  Short of breath, cough, pulmonary fibrosis EXAM: CHEST - 2 VIEW COMPARISON:  06/08/2020 FINDINGS: Frontal and lateral views of the chest demonstrate a stable cardiac silhouette. Extensive changes of pulmonary fibrosis and scarring are again noted, unchanged since recent study. In comparison to a chest CT performed 05/13/2020, there is increased consolidation within the left lower lobe which could reflect superimposed pneumonia. No effusion or pneumothorax. No acute bony abnormalities.  IMPRESSION: 1. Increased consolidation at the left lung base in comparison to recent chest CT, compatible with focal pneumonia superimposed upon widespread scarring and fibrosis. Electronically Signed   By: Randa Ngo M.D.   On: 06/11/2020 19:55  DG Chest 2 View  Result Date: 06/08/2020 CLINICAL DATA:  Shortness of breath.  Cough.  Pulmonary fibrosis. EXAM: CHEST - 2 VIEW COMPARISON:  Chest CT May 13, 2020. FINDINGS: Widespread pulmonary fibrotic change throughout the lungs is stable. No new opacity evident. Heart is upper normal in size with pulmonary vascularity normal. No adenopathy. No bone lesions. IMPRESSION: Widespread pulmonary fibrotic change, stable compared to recent CT examination. No new opacity appreciable. Should be noted that subtle pneumonia could be obscured by this degree of underlying fibrosis. Stable cardiac silhouette. No adenopathy evident. Electronically Signed   By: Lowella Grip III M.D.   On: 06/08/2020 14:16     CBC Recent Labs  Lab 06/11/20 2104 06/12/20 0507  WBC 21.7* 19.6*  HGB 12.5* 12.2*  HCT 37.7* 37.3*  PLT 263 251  MCV 87.3 88.2  MCH 28.9 28.8  MCHC 33.2 32.7  RDW 16.2* 16.2*  LYMPHSABS 13.4*  --   MONOABS 0.8  --   EOSABS 0.9*  --   BASOSABS 0.1  --     Chemistries  Recent Labs  Lab 06/11/20 2009 06/12/20 0507  NA 133* 133*  K 3.6 4.3  CL 100 103  CO2 21* 19*  GLUCOSE 155* 204*  BUN 38* 41*  CREATININE 1.80* 1.76*  CALCIUM 8.9 9.3  MG  --  2.2  AST  --  15  ALT  --  15  ALKPHOS  --  81  BILITOT  --  0.5   ------------------------------------------------------------------------------------------------------------------ No results for input(s): CHOL, HDL, LDLCALC, TRIG, CHOLHDL, LDLDIRECT in the last 72 hours.  Lab Results  Component Value Date   HGBA1C 7.0 (H) 06/12/2020   ------------------------------------------------------------------------------------------------------------------ No results for input(s): TSH,  T4TOTAL, T3FREE, THYROIDAB in the last 72 hours.  Invalid input(s): FREET3 ------------------------------------------------------------------------------------------------------------------ No results for input(s): VITAMINB12, FOLATE, FERRITIN, TIBC, IRON, RETICCTPCT in the last 72 hours.  Coagulation profile Recent Labs  Lab 06/12/20 0507  INR 1.1    No results for input(s): DDIMER in the last 72 hours.  Cardiac Enzymes No results for input(s): CKMB, TROPONINI, MYOGLOBIN in the last 168 hours.  Invalid input(s): CK ------------------------------------------------------------------------------------------------------------------    Component Value Date/Time   BNP 38.0 06/11/2020 2009     Roxan Hockey M.D on 06/12/2020 at 4:49 PM  Go to [email or web address] - for contact info  [facility] - Office  [phone]

## 2020-06-13 DIAGNOSIS — Z8709 Personal history of other diseases of the respiratory system: Secondary | ICD-10-CM

## 2020-06-13 DIAGNOSIS — E871 Hypo-osmolality and hyponatremia: Secondary | ICD-10-CM

## 2020-06-13 LAB — GLUCOSE, CAPILLARY
Glucose-Capillary: 115 mg/dL — ABNORMAL HIGH (ref 70–99)
Glucose-Capillary: 127 mg/dL — ABNORMAL HIGH (ref 70–99)
Glucose-Capillary: 75 mg/dL (ref 70–99)
Glucose-Capillary: 90 mg/dL (ref 70–99)

## 2020-06-13 MED ORDER — IPRATROPIUM-ALBUTEROL 0.5-2.5 (3) MG/3ML IN SOLN: 3.0000 mL | RESPIRATORY_TRACT | Status: DC | PRN

## 2020-06-13 NOTE — TOC Initial Note (Signed)
Transition of Care [facility]) - Initial/Assessment Note    Patient Details  Name: Jeffrey Brennan MRN: [id number] Date of Birth: [birth year]  Transition of Care [facility]) CM/SW Contact:    Natasha Bence, LCSW Phone Number: 06/13/2020, 10:33 AM  Clinical Narrative:                 Patient is a 80 year old male admitted for Sepsis secondary to CAP with acute hypoxic respiratory failure. Patient is independent at baseline. CSW notified of patient's O2 requirements. CSW referred patient to [place] with [employer]. Barbaraann Rondo agreeable to provide O2 upon discharge. TOC to follow.  Expected Discharge Plan: Home/Self Care Barriers to Discharge: Continued Medical Work up   Patient Goals and CMS Choice Patient states their goals for this hospitalization and ongoing recovery are:: Return home CMS Medicare.gov Compare Post Acute Care list provided to:: Patient Choice offered to / list presented to : Patient  Expected Discharge Plan and Services Expected Discharge Plan: Home/Self Care   Discharge Planning Services: NA Post Acute Care Choice: NA                   DME Arranged: Oxygen DME Agency: [employer] Date DME Agency Contacted: 06/13/20 Time DME Agency Contacted: [id number] Representative spoke with at DME Agency: Barbaraann Rondo HH Arranged: NA [place] Agency: NA        Prior Living Arrangements/Services   Lives with:: Self,Spouse Patient language and need for interpreter reviewed:: Yes Do you feel safe going back to the place where you live?: Yes      Need for Family Participation in Patient Care: Yes (Comment) Care giver support system in place?: Yes (comment)   Criminal Activity/Legal Involvement Pertinent to Current Situation/Hospitalization: No - Comment as needed  Activities of Daily Living Home Assistive Devices/Equipment: None ADL Screening (condition at time of admission) Patient's cognitive ability adequate to safely complete daily activities?: No Is the patient deaf or have difficulty hearing?:  No Does the patient have difficulty seeing, even when wearing glasses/contacts?: No Does the patient have difficulty concentrating, remembering, or making decisions?: No Patient able to express need for assistance with ADLs?: No Does the patient have difficulty dressing or bathing?: No Independently performs ADLs?: Yes (appropriate for developmental age) Does the patient have difficulty walking or climbing stairs?: No Weakness of Legs: Both Weakness of Arms/Hands: Both  Permission Sought/Granted   Permission granted to share information with : Yes, Verbal Permission Granted     Permission granted to share info w AGENCY: DME companies        Emotional Assessment       Orientation: : Oriented to Self,Oriented to Situation,Oriented to Place,Oriented to  Time   Psych Involvement: No (comment)  Admission diagnosis:  CAP (community acquired pneumonia) [J18.9] Acute respiratory failure with hypoxia ([place]) [J96.01] Community acquired pneumonia of left lower lobe of lung [J18.9] Stage 3b chronic kidney disease ([place]) [N18.32] Patient Active Problem List   Diagnosis Date Noted  . Sepsis secondary to CAP with acute hypoxic respiratory failure 06/12/2020  . CAP (community acquired pneumonia) 06/11/2020  . Acute respiratory failure with hypoxia ([place]) 06/11/2020  . History of pulmonary fibrosis 06/11/2020  . Essential hypertension 06/11/2020  . Hyperglycemia due to diabetes mellitus ([place]) 06/11/2020  . CKD (chronic kidney disease), stage III ([place]) 06/11/2020  . Leukocytosis 06/11/2020  . Hyponatremia 06/11/2020  . GERD (gastroesophageal reflux disease) 06/11/2020  . History of right hip hemiarthroplasty 01/08/2018  . Iron deficiency anemia due to chronic blood loss 11/21/2017  .  History of colonic polyps 11/21/2017   PCP:  [name], PA Pharmacy:   [facility], [place] [address] [place] [address] Phone: [phone] Fax:  [phone]     Social Determinants of Health (SDOH) Interventions    Readmission Risk Interventions No flowsheet data found.

## 2020-06-13 NOTE — Progress Notes (Signed)
Patient Demographics:    Jeffrey Brennan, is a 80 y.o. male, DOB - 17-Sep-1940, WPV:[id number]  Admit date - 06/11/2020   Admitting Physician Bernadette Hoit, DO  Outpatient Primary MD for the patient is [name], PA  LOS - 2   Chief Complaint  Patient presents with  . Shortness of Breath        Subjective:    Jeffrey Brennan today has no fevers, no emesis,  No chest pain,    -Cough and shortness of breath is not worse -O2 sats dropped to 85% post ambulation  Assessment  & Plan :    Principal Problem:   Sepsis secondary to CAP with acute hypoxic respiratory failure Active Problems:   CAP (community acquired pneumonia)   Acute respiratory failure with hypoxia ([place])   History of pulmonary fibrosis   Essential hypertension   Hyperglycemia due to diabetes mellitus (HCC)   CKD (chronic kidney disease), stage III (HCC)   Leukocytosis   Hyponatremia   GERD (gastroesophageal reflux disease)  Brief Summary:- 80 y.o. male with medical history significant for idiopathic pulmonary fibrosis (follows with Dr. 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 Hart Robinsons Mannam  6) acute hypoxic respiratory failure--- secondary to #1, #2 and #5 above --Continue supplemental oxygen, manage as above --O2 sats dropped to 85% post ambulation  Disposition/Need for in-Hospital Stay- patient unable to be discharged at this time due to --sepsis secondary to pneumonia with hypoxia requiring IV antibiotics*  Status is: Inpatient  Remains inpatient appropriate because:Please see above  Disposition: The patient is from: Home              Anticipated d/c is to: Home              Anticipated d/c date is: 2 days              Patient currently is not medically stable to d/c. Barriers: Not Clinically Stable-   Code Status :  -  Code Status: Full Code   Family Communication:   NA (patient is alert, awake and coherent)   Consults  :    DVT Prophylaxis  :   - SCDs  enoxaparin (LOVENOX) injection 40 mg Start: 06/12/20 0600 SCDs Start: 06/11/20 2350    Lab Results  Component Value Date   PLT 251 06/12/2020    Inpatient Medications  Scheduled Meds: . dextromethorphan-guaiFENesin  1 tablet Oral BID  . enoxaparin (LOVENOX)  injection  40 mg Subcutaneous Q24H  . guaiFENesin  600 mg Oral BID  . insulin aspart  0-5 Units Subcutaneous QHS  . insulin aspart  0-9 Units Subcutaneous TID WC  . pantoprazole  40 mg Oral Daily  . pramipexole  0.125 mg Oral BID   Continuous Infusions: . azithromycin 500 mg (06/12/20 2102)  . cefTRIAXone (ROCEPHIN)  IV 1 g (06/13/20 0950)   PRN Meds:.    Anti-infectives (From admission, onward)   Start     Dose/Rate Route Frequency Ordered Stop   06/12/20 1000  cefTRIAXone (ROCEPHIN) 1 g in sodium chloride 0.9 % 100 mL IVPB        1 g 200 mL/hr over 30 Minutes Intravenous Every 24 hours 06/12/20 0435     06/12/20 0530   azithromycin (ZITHROMAX) 500 mg in sodium chloride 0.9 % 250 mL IVPB  Status:  Discontinued        500 mg 250 mL/hr over 60 Minutes Intravenous Every 24 hours 06/12/20 0435 06/12/20 0435   06/11/20 2045  cefTRIAXone (ROCEPHIN) 2 g in sodium chloride 0.9 % 100 mL IVPB        2 g 200 mL/hr over 30 Minutes Intravenous  Once 06/11/20 2044 06/11/20 2154   06/11/20 2045  azithromycin (ZITHROMAX) 500 mg in sodium chloride 0.9 % 250 mL IVPB        500 mg 250 mL/hr over 60 Minutes Intravenous Every 24 hours 06/11/20 2044          Objective:   Vitals:   06/12/20 2007 06/13/20 0508 06/13/20 0803 06/13/20 1350  BP: 104/70 (!) 114/93  109/70  Pulse: 98 94  93  Resp: _0 Temp:  98 F (36.7 C)  (!) 97.5 F (36.4 C)  TempSrc:  Oral  Oral  SpO2: 96% 96% 91% 94%  Weight:      Height:        Wt Readings from Last 3 Encounters:  06/11/20 75.7 kg  05/18/20 74.8 kg  03/10/20 75 kg     Intake/Output Summary (Last 24 hours) at 06/13/2020 1651 Last data filed at 06/13/2020 1000 Gross per 24 hour  Intake 1200 ml  Output 1700 ml  Net -500 ml     Physical Exam  Gen:- Awake Alert, able to speak in sentences at rest but does have dyspnea on exertion, -O2 sats dropped to 85% post ambulation HEENT:- [place].AT, No sclera icterus Nose- [place] 2L/min Neck-Supple Neck,No JVD,.  Lungs-diminished breath sounds, Velcro type rales, no wheezing  CV- S1, S2 normal, regular  Abd-  +ve B.Sounds, Abd Soft, No tenderness,    Extremity/Skin:- No  edema, pedal pulses present  Psych-affect is appropriate, oriented x3 Neuro-no new focal deficits, no tremors   Data Review:   Micro Results Recent Results (from the past 240 hour(s))  Resp Panel by RT-PCR (Flu A&B, Covid) Nasopharyngeal Swab     Status: None   Collection Time: 06/11/20  8:45 PM   Specimen: Nasopharyngeal Swab; Nasopharyngeal(NP) swabs in vial transport medium  Result Value Ref Range Status   SARS Coronavirus 2 by RT PCR NEGATIVE NEGATIVE  Final    Comment: (NOTE) SARS-CoV-2 target nucleic acids are NOT DETECTED.  The SARS-CoV-2 RNA is generally detectable in upper respiratory specimens during the acute phase of infection. The lowest concentration of SARS-CoV-2 viral copies this assay can detect is 138 copies/mL. A negative result does not preclude SARS-Cov-2 infection and should not be used as the sole basis for  treatment or other patient management decisions. A negative result may occur with  improper specimen collection/handling, submission of specimen other than nasopharyngeal swab, presence of viral mutation(s) within the areas targeted by this assay, and inadequate number of viral copies(<138 copies/mL). A negative result must be combined with clinical observations, patient history, and epidemiological information. The expected result is Negative.  Fact Sheet for Patients:  EntrepreneurPulse.com.au  Fact Sheet for Healthcare Providers:  IncredibleEmployment.be  This test is no t yet approved or cleared by the [place] FDA and  has been authorized for detection and/or diagnosis of SARS-CoV-2 by FDA under an Emergency Use Authorization (EUA). This EUA will remain  in effect (meaning this test can be used) for the duration of the COVID-19 declaration under Section 564(b)(1) of the Act, 21 U.S.C.section 360bbb-3(b)(1), unless the authorization is terminated  or revoked sooner.       Influenza A by PCR NEGATIVE NEGATIVE Final   Influenza B by PCR NEGATIVE NEGATIVE Final    Comment: (NOTE) The Xpert Xpress SARS-CoV-2/FLU/RSV plus assay is intended as an aid in the diagnosis of influenza from Nasopharyngeal swab specimens and should not be used as a sole basis for treatment. Nasal washings and aspirates are unacceptable for Xpert Xpress SARS-CoV-2/FLU/RSV testing.  Fact Sheet for Patients: EntrepreneurPulse.com.au  Fact Sheet for Healthcare  Providers: IncredibleEmployment.be  This test is not yet approved or cleared by the [place] FDA and has been authorized for detection and/or diagnosis of SARS-CoV-2 by FDA under an Emergency Use Authorization (EUA). This EUA will remain in effect (meaning this test can be used) for the duration of the COVID-19 declaration under Section 564(b)(1) of the Act, 21 U.S.C. section 360bbb-3(b)(1), unless the authorization is terminated or revoked.  Performed at [facility], [address], [place], [place] [address]   Culture, blood (routine x 2)     Status: None (Preliminary result)   Collection Time: 06/11/20  9:06 PM   Specimen: Left Antecubital; Blood  Result Value Ref Range Status   Specimen Description LEFT ANTECUBITAL  Final   Special Requests   Final    BOTTLES DRAWN AEROBIC AND ANAEROBIC Blood Culture adequate volume   Culture   Final    NO GROWTH 2 DAYS Performed at [facility], [address], [place], [place] [address]    Report Status PENDING  Incomplete  Culture, blood (routine x 2)     Status: None (Preliminary result)   Collection Time: 06/11/20  9:07 PM   Specimen: Right Antecubital; Blood  Result Value Ref Range Status   Specimen Description RIGHT ANTECUBITAL  Final   Special Requests   Final    BOTTLES DRAWN AEROBIC AND ANAEROBIC Blood Culture results may not be optimal due to an excessive volume of blood received in culture bottles   Culture   Final    NO GROWTH 2 DAYS Performed at [facility], [address], [place], [place] [address]    Report Status PENDING  Incomplete    Radiology Reports DG Chest 2 View  Result Date: 06/11/2020 CLINICAL DATA:  Short of breath, cough, pulmonary fibrosis EXAM: CHEST - 2 VIEW COMPARISON:  06/08/2020 FINDINGS: Frontal and lateral views of the chest demonstrate a stable cardiac silhouette. Extensive changes of pulmonary fibrosis and scarring are again noted, unchanged since recent study. In comparison to  a chest CT performed 05/13/2020, there is increased consolidation within the left lower lobe which could reflect superimposed pneumonia. No effusion or pneumothorax. No acute bony abnormalities. IMPRESSION:  1. Increased consolidation at the left lung base in comparison to recent chest CT, compatible with focal pneumonia superimposed upon widespread scarring and fibrosis. Electronically Signed   By: Randa Ngo M.D.   On: 06/11/2020 19:55   DG Chest 2 View  Result Date: 06/08/2020 CLINICAL DATA:  Shortness of breath.  Cough.  Pulmonary fibrosis. EXAM: CHEST - 2 VIEW COMPARISON:  Chest CT May 13, 2020. FINDINGS: Widespread pulmonary fibrotic change throughout the lungs is stable. No new opacity evident. Heart is upper normal in size with pulmonary vascularity normal. No adenopathy. No bone lesions. IMPRESSION: Widespread pulmonary fibrotic change, stable compared to recent CT examination. No new opacity appreciable. Should be noted that subtle pneumonia could be obscured by this degree of underlying fibrosis. Stable cardiac silhouette. No adenopathy evident. Electronically Signed   By: Lowella Grip III M.D.   On: 06/08/2020 14:16     CBC Recent Labs  Lab 06/11/20 2104 06/12/20 0507  WBC 21.7* 19.6*  HGB 12.5* 12.2*  HCT 37.7* 37.3*  PLT 263 251  MCV 87.3 88.2  MCH 28.9 28.8  MCHC 33.2 32.7  RDW 16.2* 16.2*  LYMPHSABS 13.4*  --   MONOABS 0.8  --   EOSABS 0.9*  --   BASOSABS 0.1  --     Chemistries  Recent Labs  Lab 06/11/20 2009 06/12/20 0507  NA 133* 133*  K 3.6 4.3  CL 100 103  CO2 21* 19*  GLUCOSE 155* 204*  BUN 38* 41*  CREATININE 1.80* 1.76*  CALCIUM 8.9 9.3  MG  --  2.2  AST  --  15  ALT  --  15  ALKPHOS  --  81  BILITOT  --  0.5   ------------------------------------------------------------------------------------------------------------------ No results for input(s): CHOL, HDL, LDLCALC, TRIG, CHOLHDL, LDLDIRECT in the last 72 hours.  Lab Results   Component Value Date   HGBA1C 7.0 (H) 06/12/2020   ------------------------------------------------------------------------------------------------------------------ No results for input(s): TSH, T4TOTAL, T3FREE, THYROIDAB in the last 72 hours.  Invalid input(s): FREET3 ------------------------------------------------------------------------------------------------------------------ No results for input(s): VITAMINB12, FOLATE, FERRITIN, TIBC, IRON, RETICCTPCT in the last 72 hours.  Coagulation profile Recent Labs  Lab 06/12/20 0507  INR 1.1    No results for input(s): DDIMER in the last 72 hours.  Cardiac Enzymes No results for input(s): CKMB, TROPONINI, MYOGLOBIN in the last 168 hours.  Invalid input(s): CK ------------------------------------------------------------------------------------------------------------------    Component Value Date/Time   BNP 38.0 06/11/2020 2009     Roxan Hockey M.D on 06/13/2020 at 4:51 PM  Go to [email or web address] - for contact info  [facility] - Office  [phone]

## 2020-06-13 NOTE — Progress Notes (Signed)
SATURATION QUALIFICATIONS: (This note is used to comply with regulatory documentation for home oxygen)   Patient Saturations on Room Air at Rest = 88 %   Patient Saturations on Room Air while Ambulating = 85 %   Patient Saturations on 2 Liters of oxygen while Ambulating = 93 %      Patient needs continuous O2 at 2 L/min continuously via nasal cannula with humidifier, with gaseous portability and conserving device

## 2020-06-14 DIAGNOSIS — I10 Essential (primary) hypertension: Secondary | ICD-10-CM

## 2020-06-14 LAB — CBC
HCT: 34.2 % — ABNORMAL LOW (ref 39.0–52.0)
Hemoglobin: 11 g/dL — ABNORMAL LOW (ref 13.0–17.0)
MCH: 28.9 pg (ref 26.0–34.0)
MCHC: 32.2 g/dL (ref 30.0–36.0)
MCV: 89.8 fL (ref 80.0–100.0)
Platelets: 242 10*3/uL (ref 150–400)
RBC: 3.81 MIL/uL — ABNORMAL LOW (ref 4.22–5.81)
RDW: 16.7 % — ABNORMAL HIGH (ref 11.5–15.5)
WBC: 19.7 10*3/uL — ABNORMAL HIGH (ref 4.0–10.5)
nRBC: 0 % (ref 0.0–0.2)

## 2020-06-14 LAB — BASIC METABOLIC PANEL
Anion gap: 10 (ref 5–15)
BUN: 42 mg/dL — ABNORMAL HIGH (ref 8–23)
CO2: 22 mmol/L (ref 22–32)
Calcium: 8.9 mg/dL (ref 8.9–10.3)
Chloride: 107 mmol/L (ref 98–111)
Creatinine, Ser: 1.61 mg/dL — ABNORMAL HIGH (ref 0.61–1.24)
GFR, Estimated: 44 mL/min — ABNORMAL LOW (ref >60)
Glucose, Bld: 84 mg/dL (ref 70–99)
Potassium: 3.7 mmol/L (ref 3.5–5.1)
Sodium: 139 mmol/L (ref 135–145)

## 2020-06-14 LAB — LEGIONELLA PNEUMOPHILA SEROGP 1 UR AG: L. pneumophila Serogp 1 Ur Ag: NEGATIVE

## 2020-06-14 LAB — GLUCOSE, CAPILLARY
Glucose-Capillary: 73 mg/dL (ref 70–99)
Glucose-Capillary: 86 mg/dL (ref 70–99)

## 2020-06-14 MED ORDER — AZITHROMYCIN 500 MG PO TABS: 500.0000 mg | ORAL_TABLET | Freq: Every day | ORAL | 0 refills | Status: AC

## 2020-06-14 MED ORDER — FUROSEMIDE 20 MG PO TABS: 20.0000 mg | ORAL_TABLET | Freq: Every day | ORAL | 1 refills | Status: DC

## 2020-06-14 MED ORDER — SALINE SPRAY 0.65 % NA SOLN
1.0000 | NASAL | Status: DC | PRN
  Administered 2020-06-14: 1 via NASAL
  Filled 2020-06-14: qty 44

## 2020-06-14 MED ORDER — BUDESONIDE-FORMOTEROL FUMARATE 160-4.5 MCG/ACT IN AERO: 2.0000 | INHALATION_SPRAY | Freq: Two times a day (BID) | RESPIRATORY_TRACT | 12 refills | Status: DC

## 2020-06-14 MED ORDER — POTASSIUM CHLORIDE ER 10 MEQ PO TBCR: 10.0000 meq | EXTENDED_RELEASE_TABLET | Freq: Every day | ORAL | 2 refills | Status: DC

## 2020-06-14 MED ORDER — ALBUTEROL SULFATE HFA 108 (90 BASE) MCG/ACT IN AERS: 2.0000 | INHALATION_SPRAY | Freq: Four times a day (QID) | RESPIRATORY_TRACT | 2 refills | Status: DC | PRN

## 2020-06-14 MED ORDER — CEFDINIR 300 MG PO CAPS: 300.0000 mg | ORAL_CAPSULE | Freq: Two times a day (BID) | ORAL | 0 refills | Status: AC

## 2020-06-14 NOTE — Discharge Instructions (Signed)
1)You need oxygen at home at 2 L via nasal cannula continuously while awake and while asleep--- smoking or having open fires around oxygen can cause fire, significant injury and death  2)Avoid ibuprofen/Advil/Aleve/Motrin/Goody Powders/Naproxen/BC powders/Meloxicam/Diclofenac/Indomethacin and other Nonsteroidal anti-inflammatory medications as these will make you more likely to bleed and can cause stomach ulcers, can also cause Kidney problems.   3)Follow-up with the pulmonologist Dr. Vaughan Browner within a week for recheck and reevaluation  4)Please note that there has been some changes to your medications

## 2020-06-14 NOTE — TOC Transition Note (Signed)
Transition of Care [facility]) - CM/SW Discharge Note   Patient Details  Name: RYLEN SWINDLER MRN: [id number] Date of Birth: 1940/08/04  Transition of Care [facility]) CM/SW Contact:  Boneta Lucks, RN Phone Number: 06/14/2020, 10:30 AM   Clinical Narrative:   Patient medically ready for discharge, qualifying for home oxygen. Barbaraann Rondo with [employer] is on site to deliver. RN and MD updated.     Final next level of care: Home/Self Care Barriers to Discharge: Barriers Resolved   Patient Goals and CMS Choice Patient states their goals for this hospitalization and ongoing recovery are:: Return home CMS Medicare.gov Compare Post Acute Care list provided to:: Patient Choice offered to / list presented to : Patient  Discharge Placement          Patient and family notified of of transfer: 06/14/20  Discharge Plan and Services   Discharge Planning Services: NA Post Acute Care Choice: NA          DME Arranged: Oxygen DME Agency: [employer] Date DME Agency Contacted: 06/13/20 Time DME Agency Contacted: [phone] Representative spoke with at DME Agency: [place]: NA [place] Agency: NA     Readmission Risk Interventions Readmission Risk Prevention Plan 06/14/2020  Post Dischage Appt Complete  Medication Screening Complete  Transportation Screening Complete  Some recent data might be hidden
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
## 2020-06-15 DIAGNOSIS — J189 Pneumonia, unspecified organism: Secondary | ICD-10-CM | POA: Diagnosis not present

## 2020-06-15 DIAGNOSIS — J9601 Acute respiratory failure with hypoxia: Secondary | ICD-10-CM | POA: Diagnosis not present

## 2020-06-16 LAB — CULTURE, BLOOD (ROUTINE X 2)
Culture: NO GROWTH
Culture: NO GROWTH
Special Requests: ADEQUATE

## 2020-06-18 NOTE — Telephone Encounter (Signed)
See additional encounter from 06/11/20. Will close this encounter.

## 2020-06-21 ENCOUNTER — Ambulatory Visit: Payer: PPO | Admitting: Adult Health

## 2020-06-21 ENCOUNTER — Encounter: Payer: Self-pay | Admitting: Adult Health

## 2020-06-21 ENCOUNTER — Other Ambulatory Visit: Payer: Self-pay

## 2020-06-21 VITALS — BP 128/58 | HR 99 | Temp 98.9°F | Ht 69.0 in | Wt 171.6 lb

## 2020-06-21 DIAGNOSIS — J189 Pneumonia, unspecified organism: Secondary | ICD-10-CM

## 2020-06-21 DIAGNOSIS — J9611 Chronic respiratory failure with hypoxia: Secondary | ICD-10-CM | POA: Insufficient documentation

## 2020-06-21 DIAGNOSIS — Z8709 Personal history of other diseases of the respiratory system: Secondary | ICD-10-CM

## 2020-06-21 DIAGNOSIS — J84112 Idiopathic pulmonary fibrosis: Secondary | ICD-10-CM | POA: Diagnosis not present

## 2020-06-21 DIAGNOSIS — N183 Chronic kidney disease, stage 3 unspecified: Secondary | ICD-10-CM

## 2020-06-21 NOTE — Patient Instructions (Addendum)
Continue on Symbicort 2 puffs Twice daily  -continue for now can discuss at follow up if you need to continue .  Continue on Oxygen 2l/m at rest and 3l/m with activity, goal is to keep O2 sats >88-90%.  Activity as tolerated.  Set up 2 D echo. -set up at [facility] .  Follow up with Dr. Vaughan Browner in 3 weeks as planned  Chest xray on return.  Please contact office for sooner follow up if symptoms do not improve or worsen or seek emergency care

## 2020-06-21 NOTE — Assessment & Plan Note (Addendum)
Clinically is making slow improvement  No further abx at this time  Chest xray on return   Plan  Patient Instructions  Continue on Symbicort 2 puffs Twice daily  -continue for now can discuss at follow up if you need to continue .  Continue on Oxygen 2l/m at rest and 3l/m with activity, goal is to keep O2 sats >88-90%.  Activity as tolerated.  Set up 2 D echo. -set up at [facility] .  Follow up with Dr. Vaughan Browner in 3 weeks as planned  Chest xray on return.  Please contact office for sooner follow up if symptoms do not improve or worsen or seek emergency care

## 2020-06-21 NOTE — Assessment & Plan Note (Signed)
Progressive decline - unable to tolerate antifibrotics  2 D Echo pending ?worry for pulmonary htn .   Plan  Patient Instructions  Continue on Symbicort 2 puffs Twice daily  -continue for now can discuss at follow up if you need to continue .  Continue on Oxygen 2l/m at rest and 3l/m with activity, goal is to keep O2 sats >88-90%.  Activity as tolerated.  Set up 2 D echo. -set up at [facility] .  Follow up with Dr. Vaughan Browner in 3 weeks as planned  Chest xray on return.  Please contact office for sooner follow up if symptoms do not improve or worsen or seek emergency care

## 2020-06-21 NOTE — Assessment & Plan Note (Signed)
Cont follow up with nephrology .

## 2020-06-21 NOTE — Progress Notes (Signed)
_0  ID: Jeffrey Brennan, male    DOB: October 18, 1940, 80 y.o.   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  No mold, hot tub, Jacuzzi.  No down pillows or comforter Smoking history: 5-pack-year smoker.  Quit in the 1970s Travel history: No significant travel history Relevant family history: His younger sister had unspecified pulmonary fibrosis.  She died of complications of cirrhosis and alcohol use   CT abdomen pelvis 06/23/2014-mild scarring at the base.  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  Mild lymphadenopathy, 4.1 cm ascending thoracic erythema.  High-res CT 05/13/2020-UIP pattern pulmonary fibrosis, reactive mediastinal lymphadenopathy, thoracic aortic aneurysm, enlarged PA I have reviewed the images personally.  PFTs 05/29/2019 FVC 2.63 [68%], FEV1 2.42 [88%], F/F 92, TLC 4.06 [61%], DLCO 13.80 [59%] Moderate-severe restriction and diffusion defect.  Labs: CTD serologies 03/19/2019-ANA 1:80, nuclear speckled  06/21/2020 Follow up ; IPF and O2 RF , post hospital follow-up Patient returns for a follow-up visit.  Patient has underlying IPF.  Has been unable to tolerate Ofev and Esbriet in the past. Says gets winded with minimal activity .  Recent hospitalizationWith acute on chronic respiratory failure complicated by community-acquired pneumonia.  Patient was treated with IV antibiotics and discharged on Omnicef and  azithromycin. Started on Oxygen 2l/m at discharged. Borrowed POC from family member. Requires 3l/m on POC to keep sats >90%.   Started on Symbicort at discharge.  Since discharge remains very weak, low energy and activity tolerance . Appetite is fair. No fever, chest pain , orthopnea or hemoptysis .   Has CKD, follows with Nephrology . Lasix was decreased to 86m daily .  Has some LE edema . No increased orthopnea.   Prior to admission was not on oxygen . Had DOE with activities but was able to do light chores with frequent rest.  Lives alone, drives.  Needed a wheelchair to come in today . Says can do some activities but has to rest often .      Allergies  Allergen Reactions   Benadryl Allergy [Diphenhydramine Hcl]     Does not know what it does to him   Reglan [Metoclopramide] Other (See Comments)    Makes me crazy    Doxycycline Rash    Immunization History  Administered Date(s) Administered   Fluad Quad(high Dose 65+) 01/09/2020   Influenza, High Dose Seasonal PF 02/16/2017, 01/17/2019   Influenza,inj,Quad PF,6+ Mos 01/16/2018   Influenza-Unspecified 03/19/2016   Moderna Sars-Covid-2 Vaccination 05/15/2019, 06/13/2019, 02/02/2020   Pneumococcal Conjugate-13 07/08/2013   Pneumococcal Polysaccharide-23 11/05/2006    Past Medical History:  Diagnosis Date   Chronic renal insufficiency    CLL (chronic lymphocytic leukemia) (HCC)    Diabetes (HCC)    Gout    Hypertension     Tobacco History: Social History   Tobacco Use  Smoking Status Former Smoker   Packs/day: 0.25   Years: 3.00   Pack years: 0.75   Types: Cigarettes, Cigars   Quit date: 1970  Years since quitting: 52.2  Smokeless Tobacco Never Used   Counseling given: Not Answered   Outpatient Medications Prior to Visit  Medication Sig Dispense Refill   albuterol (VENTOLIN HFA) 108 (90 Base) MCG/ACT inhaler Inhale 2 puffs into the lungs every 6 (six) hours as needed for wheezing  or shortness of breath. 1 each 2   allopurinol (ZYLOPRIM) 300 MG tablet Take 300 mg by mouth daily.     budesonide-formoterol (SYMBICORT) 160-4.5 MCG/ACT inhaler Inhale 2 puffs into the lungs 2 (two) times daily. 10.2 g 12   citalopram (CELEXA) 40 MG tablet Take 40 mg by mouth daily.     Crisaborole (EUCRISA) 2 % OINT Apply topically as needed.      fluticasone (FLONASE) 50 MCG/ACT nasal spray Place 2 sprays into both nostrils daily.     furosemide (LASIX) 20 MG tablet Take 1 tablet (20 mg total) by mouth daily. 30 tablet 1   loratadine (CLARITIN) 10 MG tablet Take 10 mg by mouth daily.     omeprazole (PRILOSEC) 20 MG capsule Take 20 mg by mouth. 1 capsule in the evening     omeprazole (PRILOSEC) 40 MG capsule Take 40 mg by mouth daily.      potassium chloride (KLOR-CON) 10 MEQ tablet Take 1 tablet (10 mEq total) by mouth daily. Take While taking Lasix/furosemide 30 tablet 2   pramipexole (MIRAPEX) 0.125 MG tablet Take 0.125 mg by mouth in the morning and at bedtime.     No facility-administered medications prior to visit.     Review of Systems:   Constitutional:   No  weight loss, night sweats,  Fevers, chills, +fatigue, or  lassitude.  HEENT:   No headaches,  Difficulty swallowing,  Tooth/dental problems, or  Sore throat,                No sneezing, itching, ear ache, nasal congestion, post nasal drip,   CV:  No chest pain,  Orthopnea, PND  anasarca, dizziness, palpitations, syncope.   GI  No heartburn, indigestion, abdominal pain, nausea, vomiting, diarrhea, change in bowel habits, loss of appetite, bloody stools.   Resp:  .  No chest wall deformity  Skin: no rash or lesions.  GU: no dysuria, change in color of urine, no urgency or frequency.  No flank pain, no hematuria   MS:  No joint pain or swelling.  No decreased range of motion.  No back pain.    Physical Exam  BP (!) 128/58 (BP Location: Right Arm, Cuff Size: Normal)    Pulse 99    Temp 98.9 F (37.2 C)  (Temporal)    Ht _0  (1.753 m)    Wt 171 lb 9.6 oz (77.8 kg)    SpO2 96%    BMI 25.34 kg/m   GEN: A/Ox3; pleasant , NAD, chronically ill appearing in wc on O2    HEENT:  [place]/AT,    NOSE-clear, THROAT-clear, no lesions, no postnasal drip or exudate noted.   NECK:  Supple w/ fair ROM; no JVD; normal carotid impulses w/o bruits; no thyromegaly or nodules palpated; no lymphadenopathy.    RESP  BB crackles , no accessory muscle use, no dullness to percussion  CARD:  RRR, no m/r/g, 1+ peripheral edema, pulses intact, no cyanosis or clubbing.  GI:   Soft & nt; nml bowel sounds; no organomegaly or masses detected.   Musco: Warm bil, no deformities or joint swelling noted.   Neuro: alert, no focal deficits noted.  Skin: Warm, no lesions or rashes    Lab Results:    Imaging: DG Chest 2 View  Result Date: 06/11/2020 CLINICAL DATA:  Short of breath, cough, pulmonary fibrosis EXAM: CHEST - 2 VIEW COMPARISON:  06/08/2020 FINDINGS: Frontal and lateral views of the chest demonstrate a stable cardiac silhouette. Extensive changes of pulmonary fibrosis and scarring are again noted, unchanged since recent study. In comparison to a chest CT performed 05/13/2020, there is increased consolidation within the left lower lobe which could reflect superimposed pneumonia. No effusion or pneumothorax. No acute bony abnormalities. IMPRESSION: 1. Increased consolidation at the left lung base in comparison to recent chest CT, compatible with focal pneumonia superimposed upon widespread scarring and fibrosis. Electronically Signed   By: Randa Ngo M.D.   On: 06/11/2020 19:55   DG Chest 2 View  Result Date: 06/08/2020 CLINICAL DATA:  Shortness of breath.  Cough.  Pulmonary fibrosis. EXAM: CHEST - 2 VIEW COMPARISON:  Chest CT May 13, 2020. FINDINGS: Widespread pulmonary fibrotic change throughout the lungs is stable. No new opacity evident. Heart is upper normal in size with pulmonary vascularity normal.  No adenopathy. No bone lesions. IMPRESSION: Widespread pulmonary fibrotic change, stable compared to recent CT examination. No new opacity appreciable. Should be noted that subtle pneumonia could be obscured by this degree of underlying fibrosis. Stable cardiac silhouette. No adenopathy evident. Electronically Signed   By: Lowella Grip III M.D.   On: 06/08/2020 14:16    lidocaine (XYLOCAINE) 1 % (with pres) injection 3 mL    Date Action Dose Route User   Discharged on 06/14/2020   Admitted on 06/11/2020   04/27/2020 1008 Given 3 mL Other Mcarthur Rossetti, MD    methylPREDNISolone acetate (DEPO-MEDROL) injection 40 mg    Date Action Dose Route User   Discharged on 06/14/2020   Admitted on 06/11/2020   04/27/2020 1008 Given 40 mg Intra-articular Mcarthur Rossetti, MD      PFT Results Latest Ref Rng & Units 05/18/2020 05/29/2019  FVC-Pre L 2.42 2.62  FVC-Predicted Pre % 63 68  FVC-Post L 2.34 2.63  FVC-Predicted Post % 61 68  Pre FEV1/FVC % % 90 90  Post FEV1/FCV % % 93 92  FEV1-Pre L 2.19 2.37  FEV1-Predicted Pre % 81 86  FEV1-Post L 2.17 2.42  DLCO uncorrected ml/min/mmHg 10.94 13.80  DLCO UNC% % 47 59  DLCO corrected ml/min/mmHg 10.94 13.80  DLCO COR %Predicted % 47 59  DLVA Predicted % 81 85  TLC L 3.59 4.06  TLC % Predicted % 54 61  RV % Predicted % 21 51    No results found for: NITRICOXIDE      Assessment & Plan:   CAP (community acquired pneumonia) Clinically is making slow improvement  No further abx at this time  Chest xray on return   Plan  Patient Instructions  Continue on Symbicort 2 puffs Twice daily  -continue for now can discuss at follow up if you need to continue .  Continue on Oxygen 2l/m at rest and 3l/m with activity, goal is to keep O2 sats >88-90%.  Activity as tolerated.  Set up 2 D echo. -set up at [facility] .  Follow up with Dr. Vaughan Browner in 3 weeks as planned  Chest xray on return.  Please contact office for sooner follow up if  symptoms do not improve or worsen or seek emergency care       CKD (chronic kidney disease), stage III ([place]) Cont follow  up with nephrology .   Chronic respiratory failure with hypoxia (HCC) New requirement of Oxygen since admission  O2 sats adequate on O2   Plan  Patient Instructions  Continue on Symbicort 2 puffs Twice daily  -continue for now can discuss at follow up if you need to continue .  Continue on Oxygen 2l/m at rest and 3l/m with activity, goal is to keep O2 sats >88-90%.  Activity as tolerated.  Set up 2 D echo. -set up at [facility] .  Follow up with Dr. Vaughan Browner in 3 weeks as planned  Chest xray on return.  Please contact office for sooner follow up if symptoms do not improve or worsen or seek emergency care       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[place]) Progressive decline - unable to tolerate antifibrotics  2 D Echo pending ?worry for pulmonary htn .   Plan  Patient Instructions  Continue on Symbicort 2 puffs Twice daily  -continue for now can discuss at follow up if you need to continue .  Continue on Oxygen 2l/m at rest and 3l/m with activity, goal is to keep O2 sats >88-90%.  Activity as tolerated.  Set up 2 D echo. -set up at [facility] .  Follow up with Dr. Vaughan Browner in 3 weeks as planned  Chest xray on return.  Please contact office for sooner follow up if symptoms do not improve or worsen or seek emergency care          Rexene Edison, NP 06/21/2020

## 2020-06-21 NOTE — Assessment & Plan Note (Signed)
New requirement of Oxygen since admission  O2 sats adequate on O2   Plan  Patient Instructions  Continue on Symbicort 2 puffs Twice daily  -continue for now can discuss at follow up if you need to continue .  Continue on Oxygen 2l/m at rest and 3l/m with activity, goal is to keep O2 sats >88-90%.  Activity as tolerated.  Set up 2 D echo. -set up at [facility] .  Follow up with Dr. Vaughan Browner in 3 weeks as planned  Chest xray on return.  Please contact office for sooner follow up if symptoms do not improve or worsen or seek emergency care

## 2020-06-23 ENCOUNTER — Other Ambulatory Visit (HOSPITAL_COMMUNITY): Payer: PPO

## 2020-06-28 ENCOUNTER — Telehealth (HOSPITAL_COMMUNITY): Payer: Self-pay | Admitting: Pulmonary Disease

## 2020-06-28 NOTE — Telephone Encounter (Signed)
Patient cancelled echocardiogram and did not wish to reschedule at this time. Order will be removed from the ECHO WQ. If patient calls back to reschedule in the future we will reinstate the order.

## 2020-07-06 ENCOUNTER — Ambulatory Visit: Payer: PPO | Admitting: Pulmonary Disease

## 2020-07-06 ENCOUNTER — Ambulatory Visit (INDEPENDENT_AMBULATORY_CARE_PROVIDER_SITE_OTHER): Payer: PPO

## 2020-07-06 ENCOUNTER — Telehealth: Payer: Self-pay | Admitting: *Deleted

## 2020-07-06 ENCOUNTER — Other Ambulatory Visit: Payer: Self-pay

## 2020-07-06 ENCOUNTER — Encounter: Payer: Self-pay | Admitting: Pulmonary Disease

## 2020-07-06 VITALS — BP 120/60 | HR 91 | Temp 97.4°F | Ht 69.0 in | Wt 163.6 lb

## 2020-07-06 DIAGNOSIS — J189 Pneumonia, unspecified organism: Secondary | ICD-10-CM | POA: Diagnosis not present

## 2020-07-06 DIAGNOSIS — J479 Bronchiectasis, uncomplicated: Secondary | ICD-10-CM | POA: Diagnosis not present

## 2020-07-06 DIAGNOSIS — J84112 Idiopathic pulmonary fibrosis: Secondary | ICD-10-CM

## 2020-07-06 NOTE — Progress Notes (Signed)
Jeffrey Brennan    [id number]    October 24, 1940  Primary Care Physician:[name], [name], PA  Referring Physician: [name], PA [place],  [place] [address]  Problem list: Follow-up for IPF Started Ofev December 2020 but was poorly tolerated. Switch to Esbriet in October 2021 but was poorly tolerated.  Stopped Esbriet in January 7353  HPI: 80 year old with history of CLL, chronic renal insufficiency, diabetes, hypertension Previously followed by Dr. 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  No mold, hot tub, Jacuzzi.  No down pillows or comforter Smoking history: 5-pack-year smoker.  Quit in the 1970s Travel history: No significant travel history Relevant family history: His younger sister had unspecified pulmonary fibrosis.  She died of complications of cirrhosis and alcohol use  Interim history:  He had a recent hospitalization for pneumonia after failing azithromycin as an outpatient Treated with IV ceftriaxone, azithromycin and Decadron.  Discharged on 3/14 on 2 L supplemental oxygen Post discharge he has made some recovery.  Continues to have dyspnea on exertion, cough  Outpatient Encounter Medications as of 07/06/2020  Medication Sig  . albuterol (VENTOLIN HFA) 108 (90 Base) MCG/ACT inhaler Inhale 2 puffs into the lungs every 6 (six) hours as needed for wheezing or shortness of breath.  . allopurinol (ZYLOPRIM) 300 MG tablet Take 300 mg by mouth daily.  . citalopram (CELEXA) 40 MG tablet Take 40 mg by mouth daily.  [name] (EUCRISA) 2 % OINT Apply topically as needed.   . fluticasone  (FLONASE) 50 MCG/ACT nasal spray Place 2 sprays into both nostrils daily.  . furosemide (LASIX) 20 MG tablet Take 1 tablet (20 mg total) by mouth daily.  Marland Kitchen loratadine (CLARITIN) 10 MG tablet Take 10 mg by mouth daily.  Marland Kitchen omeprazole (PRILOSEC) 20 MG capsule Take 20 mg by mouth. 1 capsule in the evening  . omeprazole (PRILOSEC) 40 MG capsule Take 40 mg by mouth daily.   . potassium chloride (KLOR-CON) 10 MEQ tablet Take 1 tablet (10 mEq total) by mouth daily. Take While taking Lasix/furosemide  . pramipexole (MIRAPEX) 0.125 MG tablet Take 0.125 mg by mouth in the morning and at bedtime.  . budesonide-formoterol (SYMBICORT) 160-4.5 MCG/ACT inhaler Inhale 2 puffs into the lungs 2 (two) times daily. (Patient not taking: Reported on 07/06/2020)  . glimepiride (AMARYL) 2 MG tablet Take 2 mg by mouth every morning.   No facility-administered encounter medications on file as of 07/06/2020.   Physical Exam: Blood pressure 120/60, pulse 91, temperature (!) 97.4 F (36.3 C), temperature source Temporal, height _0  (1.753 m), weight 163 lb 9.6 oz (74.2 kg), SpO2 99 %. Gen:      No acute distress HEENT:  EOMI, sclera anicteric Neck:     No masses; no thyromegaly Lungs:    Bibasal crackles CV:         Regular rate and rhythm; no murmurs Abd:      + bowel sounds; soft, non-tender; no palpable masses, no distension Ext:    No edema; adequate peripheral perfusion Skin:      Warm and dry; no rash Neuro: alert and oriented x 3  Psych: normal mood and affect breo  Data Reviewed: Imaging: CT abdomen pelvis 06/23/2014-mild scarring at the base.  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  Mild lymphadenopathy, 4.1 cm ascending thoracic erythema.  High-res CT 05/13/2020-UIP pattern pulmonary fibrosis, reactive mediastinal lymphadenopathy, thoracic aortic aneurysm, enlarged PA  Chest x-ray 3/11-new left lower lobe infiltrate Chest x-ray 4/5-advanced fibrotic changes, no infiltrate noted I have  reviewed the images personally  I have reviewed the images personally.  PFTs 05/29/2019 FVC 2.63 [68%], FEV1 2.42 [88%], F/F 92, TLC 4.06 [61%], DLCO 13.80 [59%] Moderate-severe restriction and diffusion defect.  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 Significantly progressed since 2016.  He does not have any signs and symptoms of connective tissue disease or exposures.  Has borderline ANA which is nonspecific.  Exposure history is is not significant as he had minimal work with metal grinding which is more of a hobby  He has not tolerated Ofev and Esbriet Pulmonary rehab is limited by mobility issues due to hip bursitis We discussed lung transplant but he is not interested in this.  Enlarged PA Echocardiogram ordered but not done yet.  This will need to be reordered BNP in March 2022 was normal.  Goals of care Had good discussion with Kaiyu and his son in office today He has a setback with recent pneumonia and now on supplemental oxygen. Its not clear if he will get back to baseline. [place] wants to maintain his independence and ability to live alone  Discussed DNR status but he is not ready to make that decision yet I will refer him to palliative care.  Plan/Recommendations: - Referral for palliative care - Echocardiogram  Marshell Garfinkel MD [facility] 07/06/2020, 10:23 AM  CC: [name], PA

## 2020-07-06 NOTE — Telephone Encounter (Signed)
Call [phone] regarding the echocardiogram, I was told they had both been cancelled.  The patient had been called after the second echo was ordered and he declined to schedule.  Requested that the patient be called to schedule the procedure as the patient was in the office today and is aware that Dr. Vaughan Browner wants the test done.  The order will be reinstated and patient will be contacted to schedule the echo.  Nothing further needed.

## 2020-07-06 NOTE — Patient Instructions (Signed)
Continue the supplemental oxygen We will refer you to palliative care  We will check the status of echocardiogram to be done in [place] Follow-up in 3 months.

## 2020-07-06 NOTE — Addendum Note (Signed)
Addended by: Vanessa Barbara on: 07/06/2020 02:10 PM   Modules accepted: Orders

## 2020-07-14 DIAGNOSIS — C9110 Chronic lymphocytic leukemia of B-cell type not having achieved remission: Secondary | ICD-10-CM | POA: Diagnosis not present

## 2020-07-14 DIAGNOSIS — J189 Pneumonia, unspecified organism: Secondary | ICD-10-CM | POA: Diagnosis not present

## 2020-07-14 DIAGNOSIS — I10 Essential (primary) hypertension: Secondary | ICD-10-CM | POA: Diagnosis not present

## 2020-07-14 DIAGNOSIS — Z8709 Personal history of other diseases of the respiratory system: Secondary | ICD-10-CM | POA: Diagnosis not present

## 2020-07-14 DIAGNOSIS — E1122 Type 2 diabetes mellitus with diabetic chronic kidney disease: Secondary | ICD-10-CM | POA: Diagnosis not present

## 2020-07-14 DIAGNOSIS — N183 Chronic kidney disease, stage 3 unspecified: Secondary | ICD-10-CM | POA: Diagnosis not present

## 2020-07-16 DIAGNOSIS — J9601 Acute respiratory failure with hypoxia: Secondary | ICD-10-CM | POA: Diagnosis not present

## 2020-07-16 DIAGNOSIS — J189 Pneumonia, unspecified organism: Secondary | ICD-10-CM | POA: Diagnosis not present

## 2020-07-29 ENCOUNTER — Other Ambulatory Visit: Payer: Self-pay

## 2020-07-29 ENCOUNTER — Ambulatory Visit (HOSPITAL_COMMUNITY)
Admission: RE | Admit: 2020-07-29 | Discharge: 2020-07-29 | Disposition: A | Discharge status: Home / Self Care | Payer: PPO | Source: Ambulatory Visit | Attending: Pulmonary Disease | Admitting: Pulmonary Disease

## 2020-07-29 DIAGNOSIS — E119 Type 2 diabetes mellitus without complications: Secondary | ICD-10-CM | POA: Insufficient documentation

## 2020-07-29 DIAGNOSIS — I272 Pulmonary hypertension, unspecified: Secondary | ICD-10-CM | POA: Insufficient documentation

## 2020-07-29 DIAGNOSIS — I340 Nonrheumatic mitral (valve) insufficiency: Secondary | ICD-10-CM | POA: Diagnosis not present

## 2020-07-29 DIAGNOSIS — I10 Essential (primary) hypertension: Secondary | ICD-10-CM | POA: Diagnosis not present

## 2020-07-29 DIAGNOSIS — J84112 Idiopathic pulmonary fibrosis: Secondary | ICD-10-CM | POA: Insufficient documentation

## 2020-07-29 DIAGNOSIS — I2729 Other secondary pulmonary hypertension: Secondary | ICD-10-CM | POA: Diagnosis not present

## 2020-07-29 LAB — ECHOCARDIOGRAM COMPLETE
Area-P 1/2: 2.69 cm2
S' Lateral: 2.43 cm

## 2020-08-02 ENCOUNTER — Telehealth: Payer: Self-pay | Admitting: Pulmonary Disease

## 2020-08-02 MED ORDER — PREDNISONE 20 MG PO TABS: 20.0000 mg | ORAL_TABLET | Freq: Every day | ORAL | 0 refills | Status: DC

## 2020-08-02 NOTE — Telephone Encounter (Signed)
Called and spoke with patient to let him know of recs from [place]. He verified preferred pharmacy. RX has been sent in. Advised him that if we hear anything new as far as recommendations from Dr. Vaughan Browner we would call and let him know. Patient states that he has not heard anything from Pallative care. Will route to [facility] for follow up.

## 2020-08-02 NOTE — Telephone Encounter (Signed)
I faxed referral to [facility] on 4/6.  I called to check status & was transferred to vm of intake manager Loraine Maple.  I left a vm checking on status of referral.

## 2020-08-02 NOTE — Telephone Encounter (Signed)
Primary Pulmonologist: Dr. Vaughan Browner Last office visit and with whom: 07/06/20 with Dr. Vaughan Browner What do we see them for (pulmonary problems): IPF Last OV assessment/plan: Assessment:  IPF Reviewed scan with fibrosis in UIP pattern. Significantly progressed since 2016.  He does not have any signs and symptoms of connective tissue disease or exposures.  Has borderline ANA which is nonspecific.  Exposure history is is not significant as he had minimal work with metal grinding which is more of a hobby  He has not tolerated Ofev and Esbriet Pulmonary rehab is limited by mobility issues due to hip bursitis We discussed lung transplant but he is not interested in this.  Enlarged PA Echocardiogram ordered but not done yet.  This will need to be reordered BNP in March 2022 was normal.  Goals of care Had good discussion with Jeffrey Brennan and his son in office today He has a setback with recent pneumonia and now on supplemental oxygen. Its not clear if he will get back to baseline. Jeffrey Brennan wants to maintain his independence and ability to live alone  Discussed DNR status but he is not ready to make that decision yet I will refer him to palliative care.  Plan/Recommendations: - Referral for palliative care - Echocardiogram  Was appointment offered to patient (explain)?  Patient wanted recommendations   Reason for call: Called and spoke with patient. He stated that since his OV with Dr. Vaughan Browner back in early April, his cough has gotten worse. Stated that the cough at times is productive with thick, white phlegm but it is not always productive. Increased SOB. He is currently on 3L of O2 24/7. He was able to check his O2 while on the phone and it was 97% with oxygen. He denied any fevers or body aches. Also denied being around who has been sick recently.   He has been using his albuterol inhaler as needed with no relief. He has also been using Delsym but this does not last for long. He also stated that the  Delsym makes him extremely sleepy and he doesn't like to take it often.   He was diagnosed with PNA back in March 2022 and was advised that it could take up to 12 weeks before the PNA could resolve.   (examples of things to ask: : When did symptoms start? Fever? Cough? Productive? Color to sputum? More sputum than usual? Wheezing? Have you needed increased oxygen? Are you taking your respiratory medications? What over the counter measures have you tried?)  Allergies  Allergen Reactions  . Benadryl Allergy [Diphenhydramine Hcl]     Does not know what it does to him  . Reglan [Metoclopramide] Other (See Comments)    Makes me crazy   . Doxycycline Rash    Immunization History  Administered Date(s) Administered  . Fluad Quad(high Dose 65+) 01/09/2020  . Influenza, High Dose Seasonal PF 02/16/2017, 01/17/2019  . Influenza,inj,Quad PF,6+ Mos 01/16/2018  . Influenza-Unspecified 03/19/2016  . Moderna Sars-Covid-2 Vaccination 05/15/2019, 06/13/2019, 02/02/2020  . Pneumococcal Conjugate-13 07/08/2013  . Pneumococcal Polysaccharide-23 11/05/2006   Pharmacy is [facility] [place], can you please advise since Dr. Vaughan Browner is not available today? Thanks.

## 2020-08-02 NOTE — Telephone Encounter (Signed)
Hx of IPF   Flare of cough and dyspnea.   Can send in Prednisone 35m daily for 5 days .  If not improving will need ov for further evaluation   Is Palliative care following now, referral made last ov ?   Also recent echo showed PAH , will send to Dr. MVaughan Browneras this may also be contributing to worsening dyspnea.  ?further recommendation    Please contact office for sooner follow up if symptoms do not improve or worsen or seek emergency care

## 2020-08-02 NOTE — Telephone Encounter (Signed)
Tammy saw patient in March, I am going to send to her and Dr. Vaughan Browner who is doing admin this morning

## 2020-08-03 NOTE — Telephone Encounter (Signed)
Spoke to [place] at [facility] and [place].  She states referral not received.  I verified fax # & refaxed order.  I called back & spoke to [facility].  She verified referral has been received & Vito Backers has already spoken to [facility] the referral coordinator & they are working on referral now. They just need to make sure PM will be attending provider for services.  I will send him a staff message and then let them know his answer.  Nothing further needed for phone message.

## 2020-08-06 DIAGNOSIS — J8410 Pulmonary fibrosis, unspecified: Secondary | ICD-10-CM | POA: Diagnosis not present

## 2020-08-06 DIAGNOSIS — Z515 Encounter for palliative care: Secondary | ICD-10-CM | POA: Diagnosis not present

## 2020-08-13 ENCOUNTER — Telehealth: Payer: Self-pay | Admitting: Pulmonary Disease

## 2020-08-13 MED ORDER — PREDNISONE 20 MG PO TABS: 20.0000 mg | ORAL_TABLET | Freq: Every day | ORAL | 0 refills | Status: DC

## 2020-08-13 MED ORDER — BENZONATATE 200 MG PO CAPS: 200.0000 mg | ORAL_CAPSULE | Freq: Three times a day (TID) | ORAL | 1 refills | Status: AC | PRN

## 2020-08-13 NOTE — Telephone Encounter (Signed)
Hx of IPF , referred to Palliative last ov . CXR w/ no acute process   rec : Delsym 2 tsp Twice daily  For cough As needed   Tessalon 261m Three times a day  For cough As needed  -#30 1 rfill  Prednisone 255mdaily for 5 days ,   If not helping will need ov to discuss further   Please contact office for sooner follow up if symptoms do not improve or worsen or seek emergency care   [facility]

## 2020-08-13 NOTE — Telephone Encounter (Signed)
Called and spoke with patient to go over recs from [place]. He expressed understanding. Advised him that if he is not feeling any better after the prednisone and the other recs that he would need to come into office to be seen. He expressed understanding.  Prescriptions have been sent to preferred pharmacy. Nothing further needed at this time.

## 2020-08-13 NOTE — Telephone Encounter (Signed)
Called and spoke with patient who states that he has a hacky dry cough that is not getting any better. He states that he is using rescue inhaler more than normal, states that he's using it just about every 4 hours the last couple days. States he is still using Fluticasone, loratadine and omeprazole. Was given prednisone at beginning of month. Is not using Symbicort inhaler.   Tammy please advise on behalf of Dr. Vaughan Browner

## 2020-08-15 DIAGNOSIS — J9601 Acute respiratory failure with hypoxia: Secondary | ICD-10-CM | POA: Diagnosis not present

## 2020-08-15 DIAGNOSIS — J189 Pneumonia, unspecified organism: Secondary | ICD-10-CM | POA: Diagnosis not present

## 2020-08-16 DIAGNOSIS — N289 Disorder of kidney and ureter, unspecified: Secondary | ICD-10-CM | POA: Diagnosis not present

## 2020-08-16 DIAGNOSIS — E8809 Other disorders of plasma-protein metabolism, not elsewhere classified: Secondary | ICD-10-CM | POA: Diagnosis not present

## 2020-08-16 DIAGNOSIS — D508 Other iron deficiency anemias: Secondary | ICD-10-CM | POA: Diagnosis not present

## 2020-08-16 DIAGNOSIS — C9110 Chronic lymphocytic leukemia of B-cell type not having achieved remission: Secondary | ICD-10-CM | POA: Diagnosis not present

## 2020-08-18 DIAGNOSIS — I10 Essential (primary) hypertension: Secondary | ICD-10-CM | POA: Diagnosis not present

## 2020-08-18 DIAGNOSIS — K219 Gastro-esophageal reflux disease without esophagitis: Secondary | ICD-10-CM | POA: Diagnosis not present

## 2020-08-18 DIAGNOSIS — G2581 Restless legs syndrome: Secondary | ICD-10-CM | POA: Diagnosis not present

## 2020-08-18 DIAGNOSIS — N183 Chronic kidney disease, stage 3 unspecified: Secondary | ICD-10-CM | POA: Diagnosis not present

## 2020-08-18 DIAGNOSIS — M1A00X0 Idiopathic chronic gout, unspecified site, without tophus (tophi): Secondary | ICD-10-CM | POA: Diagnosis not present

## 2020-08-18 DIAGNOSIS — N189 Chronic kidney disease, unspecified: Secondary | ICD-10-CM | POA: Diagnosis not present

## 2020-08-18 DIAGNOSIS — E782 Mixed hyperlipidemia: Secondary | ICD-10-CM | POA: Diagnosis not present

## 2020-08-18 DIAGNOSIS — E119 Type 2 diabetes mellitus without complications: Secondary | ICD-10-CM | POA: Diagnosis not present

## 2020-08-18 DIAGNOSIS — E7849 Other hyperlipidemia: Secondary | ICD-10-CM | POA: Diagnosis not present

## 2020-08-18 DIAGNOSIS — C9191 Lymphoid leukemia, unspecified, in remission: Secondary | ICD-10-CM | POA: Diagnosis not present

## 2020-08-18 DIAGNOSIS — Z6823 Body mass index (BMI) 23.0-23.9, adult: Secondary | ICD-10-CM | POA: Diagnosis not present

## 2020-08-23 ENCOUNTER — Other Ambulatory Visit: Payer: Self-pay | Admitting: Adult Health

## 2020-08-24 DIAGNOSIS — D508 Other iron deficiency anemias: Secondary | ICD-10-CM | POA: Diagnosis not present

## 2020-08-24 DIAGNOSIS — N289 Disorder of kidney and ureter, unspecified: Secondary | ICD-10-CM | POA: Diagnosis not present

## 2020-08-24 DIAGNOSIS — C9110 Chronic lymphocytic leukemia of B-cell type not having achieved remission: Secondary | ICD-10-CM | POA: Diagnosis not present

## 2020-08-24 DIAGNOSIS — J8410 Pulmonary fibrosis, unspecified: Secondary | ICD-10-CM | POA: Diagnosis not present

## 2020-08-25 ENCOUNTER — Telehealth: Payer: Self-pay | Admitting: Adult Health

## 2020-08-25 NOTE — Telephone Encounter (Signed)
Will leave switch to [place] office to Dr. Vaughan Browner decision .   Is palliative care following him now ? 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 RV size is normal  CKD , unable to do CTa Chest . Could consider VQ scan .  Needs ov to evaluate.  Can set up telemedicine visit with APP if unable to come in for visit this week   Please contact office for sooner follow up if symptoms do not improve or worsen or seek emergency care

## 2020-08-25 NOTE — Telephone Encounter (Signed)
Called and spoke with patient. He has been scheduled for a televisit at 1030am with TP on 5/27. He stated that he has established with palliative care but they are not scheduled to see him again until June 17th. He was seen by his hematologist yesterday and he mentioned the echo results but he has not heard anything from this office in regards to the results.   He is aware to call our office if his breathing or coughing gets worse between now and Friday.   Nothing further needed at time of call.

## 2020-08-25 NOTE — Telephone Encounter (Signed)
Primary Pulmonologist: Dr. Vaughan Browner Last office visit and with whom: 07/06/20 with Dr. 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 Significantly progressed since 2016.  He does not have any signs and symptoms of connective tissue disease or exposures.  Has borderline ANA which is nonspecific.  Exposure history is is not significant as he had minimal work with metal grinding which is more of a hobby  He has not tolerated Ofev and Esbriet Pulmonary rehab is limited by mobility issues due to hip bursitis We discussed lung transplant but he is not interested in this.  Enlarged PA Echocardiogram ordered but not done yet.  This will need to be reordered BNP in March 2022 was normal.  Goals of care Had good discussion with Jeffrey Brennan and Jeffrey Brennan in office today He has a setback with recent pneumonia and now on supplemental oxygen. Its not clear if he will get back to baseline. Jeffrey Brennan wants to maintain Jeffrey independence and ability to live alone  Discussed DNR status but he is not ready to make that decision yet I will refer him to palliative care.  Plan/Recommendations: - Referral for palliative care - Echocardiogram  Was appointment offered to patient (explain)?  I did offer an appt to patient but he stated that it takes a lot to get him to [place]. He has decided that he wants to transfer to [place]. He has had a convo with Dr. Vaughan Browner about this before but that particular time, he was not ready to switch.    Reason for call: Spoke with patient. He stated that he has finished the prednisone that was sent in on 08/13/20 by TP. Jeffrey cough did decrease some while on the prednisone but he feels the cough is starting to ramp up again. Jeffrey SOB has remained stable. He wants to remain active as much as he can but the slightest exertion causes him to feel SOB. Denied any fevers or body aches.    The last sick message stated that he needed to come in for an appt. I had attempted to get him scheduled today with someone but he stated that he is not able to drive to [facility] by himself anymore.     Allergies  Allergen Reactions  . Benadryl Allergy [Diphenhydramine Hcl]     Does not know what it does to him  . Reglan [Metoclopramide] Other (See Comments)    Makes me crazy   . Doxycycline Rash    Immunization History  Administered Date(s) Administered  . Fluad Quad(high Dose 65+) 01/09/2020  . Influenza, High Dose Seasonal PF 02/16/2017, 01/17/2019  . Influenza,inj,Quad PF,6+ Mos 01/16/2018  . Influenza-Unspecified 03/19/2016  . Moderna Sars-Covid-2 Vaccination 05/15/2019, 06/13/2019, 02/02/2020  . Pneumococcal Conjugate-13 07/08/2013  . Pneumococcal Polysaccharide-23 11/05/2006   TP, can you please advise? Thanks.

## 2020-08-26 ENCOUNTER — Telehealth: Payer: Self-pay | Admitting: Pulmonary Disease

## 2020-08-26 DIAGNOSIS — Z515 Encounter for palliative care: Secondary | ICD-10-CM | POA: Diagnosis not present

## 2020-08-26 DIAGNOSIS — J8410 Pulmonary fibrosis, unspecified: Secondary | ICD-10-CM | POA: Diagnosis not present

## 2020-08-26 NOTE — Telephone Encounter (Signed)
Left message for Suanne Marker to call back.

## 2020-08-27 ENCOUNTER — Other Ambulatory Visit (HOSPITAL_COMMUNITY)
Admission: RE | Admit: 2020-08-27 | Discharge: 2020-08-27 | Disposition: A | Discharge status: Home / Self Care | Payer: PPO | Source: Ambulatory Visit | Attending: Adult Health | Admitting: Adult Health

## 2020-08-27 ENCOUNTER — Other Ambulatory Visit: Payer: Self-pay

## 2020-08-27 ENCOUNTER — Telehealth: Payer: Self-pay | Admitting: *Deleted

## 2020-08-27 ENCOUNTER — Encounter: Payer: Self-pay | Admitting: Adult Health

## 2020-08-27 ENCOUNTER — Other Ambulatory Visit: Payer: Self-pay | Admitting: *Deleted

## 2020-08-27 ENCOUNTER — Telehealth: Payer: Self-pay | Admitting: Adult Health

## 2020-08-27 ENCOUNTER — Ambulatory Visit: Payer: PPO

## 2020-08-27 ENCOUNTER — Ambulatory Visit (INDEPENDENT_AMBULATORY_CARE_PROVIDER_SITE_OTHER): Payer: PPO | Admitting: Adult Health

## 2020-08-27 DIAGNOSIS — R0600 Dyspnea, unspecified: Secondary | ICD-10-CM

## 2020-08-27 DIAGNOSIS — R7989 Other specified abnormal findings of blood chemistry: Secondary | ICD-10-CM

## 2020-08-27 DIAGNOSIS — J84112 Idiopathic pulmonary fibrosis: Secondary | ICD-10-CM

## 2020-08-27 DIAGNOSIS — R0602 Shortness of breath: Secondary | ICD-10-CM

## 2020-08-27 LAB — D-DIMER, QUANTITATIVE: D-Dimer, Quant: 0.72 ug/mL-FEU — ABNORMAL HIGH (ref 0.00–0.50)

## 2020-08-27 MED ORDER — PREDNISONE 10 MG PO TABS: 10.0000 mg | ORAL_TABLET | Freq: Every day | ORAL | 1 refills | Status: DC

## 2020-08-27 NOTE — Telephone Encounter (Signed)
Called and spoke with Lysbeth Galas from [facility] regarding the prednisone, advised that Tammy wants it filled as he will be taking 10 mg after finishing the burst.  Nothing further needed.

## 2020-08-27 NOTE — Patient Instructions (Addendum)
Taper prednisone 80m daily and hold at this dose until seen back in office  Labs today .  Continue on Oxygen 3l/m at rest and 4l/m with activity, goal is to keep O2 sats >88-90%.  Activity as tolerated.  Albuterol inhaler /Neb as needed  Continue with Palliative care .  [facility]  Please contact office for sooner follow up if symptoms do not improve or worsen or seek emergency care

## 2020-08-27 NOTE — Telephone Encounter (Signed)
Yes go ahead and feel prednisone he is going to begin the prednisone after his 5-day prednisone burst.

## 2020-08-27 NOTE — Progress Notes (Signed)
Virtual Visit via Telephone Note  I connected with Jeffrey Brennan on 08/27/20 at 10:30 AM EDT by telephone and verified that I am speaking with the correct person using two identifiers.  Location: Patient: Home   Provider: Office    I discussed the limitations, risks, security and privacy concerns of performing an evaluation and management service by telephone and the availability of in person appointments. I also discussed with the patient that there may be a patient responsible charge related to this service. The patient expressed understanding and agreed to proceed.   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  Unable to tolerate Ofev or Esbriet Medical history is significant for CLL, diabetes, chronic kidney disease  Today's telemedicine visit is for follow up . Complains of ongoing symptoms of cough , dyspnea , severe coughing paroxysms, decreased activity tolerance.  Last visit patient was referred to palliative care. 2D echo July 29, 2020 showed EF at 55 to 60%, right ventricular systolic function mildly decreased.  Severely elevated pulmonary artery pressure 63 mmHgPalliative care now following . Started on neb and steroids for ongoing cough shortness of breath and decreased activity tolerance On Oxygen 3l/m . O2 sats currently at home 94%.  Albuterol neb is helping some.  Patient is afraid that when he comes off the steroids his cough will increase.  He says the prednisone does help some. Patient denies any discolored mucus, fever, chest pain, orthopnea or increased leg swelling.  Denies any hemoptysis, calf pain. Patient says he wants to switch to our [place] office as it is very difficult for him to drive and come into our office.  Past Medical History:  Diagnosis Date  . Chronic renal insufficiency   . CLL (chronic lymphocytic leukemia) ([place])   . Diabetes  ([place])   . Gout   . Hypertension    Current Outpatient Medications on File Prior to Visit  Medication Sig Dispense Refill  . albuterol (VENTOLIN HFA) 108 (90 Base) MCG/ACT inhaler Inhale 2 puffs into the lungs every 6 (six) hours as needed for wheezing or shortness of breath. 1 each 2  . allopurinol (ZYLOPRIM) 300 MG tablet Take 300 mg by mouth daily.    . benzonatate (TESSALON) 200 MG capsule Take 1 capsule (200 mg total) by mouth 3 (three) times daily as needed for cough. 30 capsule 1  . citalopram (CELEXA) 40 MG tablet Take 40 mg by mouth daily.    [name] (EUCRISA) 2 % OINT Apply topically as needed.     . fluticasone (FLONASE) 50 MCG/ACT nasal spray Place 2 sprays into both nostrils daily.    . furosemide (LASIX) 20 MG tablet Take 1 tablet (20 mg total) by mouth daily. 30 tablet 1  . glimepiride (AMARYL) 2 MG tablet Take 2 mg by mouth every morning.    . loratadine (CLARITIN) 10 MG tablet Take 10 mg by mouth daily.    Marland Kitchen omeprazole (PRILOSEC) 20 MG capsule Take 20 mg by mouth. 1 capsule in the evening    . omeprazole (PRILOSEC) 40 MG capsule Take 40 mg by mouth daily.     . potassium chloride (KLOR-CON) 10 MEQ tablet Take 1 tablet (10 mEq total) by mouth daily. Take While taking Lasix/furosemide 30 tablet 2  . pramipexole (MIRAPEX) 0.125 MG tablet Take 0.125 mg by mouth in the morning and at bedtime.    . predniSONE (DELTASONE) 20 MG tablet Take  1 tablet (20 mg total) by mouth daily with breakfast. 5 tablet 0  . budesonide-formoterol (SYMBICORT) 160-4.5 MCG/ACT inhaler Inhale 2 puffs into the lungs 2 (two) times daily. (Patient not taking: No sig reported) 10.2 g 12   No current facility-administered medications on file prior to visit.       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 No mold, hot tub, Jacuzzi. No down pillows or comforter Smoking history: 5-pack-year smoker. Quit in the 1970s Travel history: No significant travel history Relevant family history: His younger sister had unspecified pulmonary fibrosis. She died of complications of cirrhosis and alcohol use   CT abdomen pelvis 06/23/2014-mild scarring at the base.  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 Mild lymphadenopathy, 4.1 cm ascending thoracic erythema.  High-res CT 05/13/2020-UIP pattern pulmonary fibrosis, reactive mediastinal lymphadenopathy, thoracic aortic aneurysm, enlarged PA I have reviewed the images personally.  PFTs2/25/2021 FVC 2.63 [68%], FEV1 2.42 [88%], F/F 92, TLC 4.06 [61%], DLCO 13.80 [59%] Moderate-severe restriction and diffusion defect.  Labs: CTD serologies 03/19/2019-ANA 1:80, nuclear speckled  Assessment and Plan: Progressive IPF.  Patient is intolerant to Ofev and Esbriet.  Seems to have some steroid responsiveness.  For now we will continue on prednisone taper.  And decrease down to 10 mg daily until seen back in the office.  Continue oxygen to maintain O2 saturations greater than 88 to 90%. Suspect his shortness of breath is multifactorial with progressive IPF, deconditioning.  Recent echo does show severe pulmonary hypertension.  With decreased RV function. Will check a D-dimer.  If positive will need to proceed with a VQ scan.  Patient has chronic kidney disease and cannot have a CT angio  Plan  Patient Instructions  Taper prednisone 27m daily and hold at this dose until seen back in office  Labs today .  Continue on Oxygen 3l/m at rest and 4l/m with activity, goal is to keep O2 sats >88-90%.  Activity as tolerated.  Albuterol inhaler /Neb as needed  Continue with Palliative care .  [facility]  Please contact office for sooner  follow up if symptoms do not improve or worsen or seek emergency care       Follow Up Instructions: Follow up in 4 weeks and As needed   Please contact office for sooner follow up if symptoms do not improve or worsen or seek emergency care     I discussed the assessment and treatment plan with the patient. The patient was provided an opportunity to ask questions and all were answered. The patient agreed with the plan and demonstrated an understanding of the instructions.   The patient was advised to call back or seek an in-person evaluation if the symptoms worsen or if the condition fails to improve as anticipated.  I provided 31 minutes of non-face-to-face time during this encounter.   TRexene Edison NP

## 2020-08-27 NOTE — Telephone Encounter (Signed)
Received call from [facility] requesting Prednisone clarification.  Patient received Prednisone 08/26/20, 28m daily for 5 days, prescribed by RDominica Severin  Pharmacist is concerned Tammy, NP did not know Patient received Prednisone yesterday and if Prednisone 149mdaily prescribed today needed to be filled, placed on hold, or cancelled.   Message routed to [facility] to advise

## 2020-08-27 NOTE — Addendum Note (Signed)
Addended by: Vanessa Barbara on: 08/27/2020 04:11 PM   Modules accepted: Orders

## 2020-08-27 NOTE — Telephone Encounter (Signed)
Patient had a televisit today with Rexene Edison NP.  Patient wanting to switch to [place] office as he lives in [place].  Please advise if ok to switch to Dr. Elsworth Soho so he can be seen in the [place] office?  Thank you.

## 2020-08-27 NOTE — Progress Notes (Signed)
Called and spoke with patient, advised of results/recommendations per Rexene Edison NP, advised him that one of our PCCs would call to schedule the VQ scan.  He verbalized understanding.  He prefers to do it in [place].  Nothing further needed.  Also advised that once we hear back from Dr. Vaughan Browner and Dr. Elsworth Soho, we will schedule his f/u in [place].

## 2020-08-28 ENCOUNTER — Telehealth: Payer: Self-pay | Admitting: Pulmonary Disease

## 2020-08-28 NOTE — Telephone Encounter (Signed)
I was called by the answering service that the patient was concerned for recent lab work he had at his televisit on 08/27/2020 regarding an elevated D-dimer and concern for DVT/PE which was ordered due to increasing shortness of breath and change in right heart function on recent echocardiogram.  Patient's D-dimer was 0.72 and based on his age-adjusted score it is unlikely that he has a DVT/PE.  Also given his underlying ILD it would be more beneficial to obtain a CTA chest in the future if there is concern for thromboembolic disease as a VQ scan may not entirely be accurate given his underlying lung fibrosis.  I have explained to the patient that his lab value is not concerning for DVT/PE at this time.  If he were to develop worsening shortness of breath, chest pain, dizziness, lightheadedness or syncope he should go to the emergency room immediately.  Freda Jackson, MD [facility] Office: [phone]   See Amion for personal pager PCCM on call pager [phone] until 7pm. Please call [place] 7p-7a. [phone]

## 2020-08-29 NOTE — Telephone Encounter (Signed)
Okay with me for next available 30-minute appointment

## 2020-08-31 NOTE — Telephone Encounter (Signed)
I have called the pt and he is aware of appt scheduled with RA in [place] in July.

## 2020-09-01 ENCOUNTER — Telehealth: Payer: Self-pay | Admitting: Adult Health

## 2020-09-01 ENCOUNTER — Ambulatory Visit (HOSPITAL_COMMUNITY)
Admission: RE | Admit: 2020-09-01 | Discharge: 2020-09-01 | Disposition: A | Discharge status: Home / Self Care | Payer: PPO | Source: Ambulatory Visit | Attending: Adult Health | Admitting: Adult Health

## 2020-09-01 ENCOUNTER — Encounter (HOSPITAL_COMMUNITY)
Admission: RE | Admit: 2020-09-01 | Discharge: 2020-09-01 | Disposition: A | Discharge status: Home / Self Care | Payer: PPO | Source: Ambulatory Visit | Attending: Adult Health | Admitting: Adult Health

## 2020-09-01 ENCOUNTER — Other Ambulatory Visit: Payer: Self-pay

## 2020-09-01 DIAGNOSIS — R7989 Other specified abnormal findings of blood chemistry: Secondary | ICD-10-CM | POA: Insufficient documentation

## 2020-09-01 DIAGNOSIS — R0600 Dyspnea, unspecified: Secondary | ICD-10-CM

## 2020-09-01 DIAGNOSIS — J479 Bronchiectasis, uncomplicated: Secondary | ICD-10-CM | POA: Diagnosis not present

## 2020-09-01 MED ORDER — APIXABAN (ELIQUIS) VTE STARTER PACK (10MG AND 5MG)
ORAL_TABLET | ORAL | 0 refills | Status: DC
Start: 2020-09-01 — End: 2020-09-07

## 2020-09-01 NOTE — Telephone Encounter (Signed)
Call report on VQ done today:   IMPRESSION: Findings concerning for a small pulmonary embolism within the medial RIGHT middle lobe.  Underlying pulmonary fibrosis.   Electronically Signed   By: Suzy Bouchard M.D.   On: 09/01/2020 11:52   The person calling was not able to tell me if the pt was still there Will forward to Tammy, thanks

## 2020-09-01 NOTE — Telephone Encounter (Signed)
Call report from [facility]  Findings concerning for a small pulmonary embolism within the medial RIGHT middle lobe.  Underlying pulmonary fibrosis.  Patient with worsening dyspnea for for last 2 months ,  Stable at home with palliative care , no increased O2 demands , O2 sats 94-95% on 3l/m   Discussion with Dr. Vaughan Browner and patient . Would like to treat as outpatient with close follow up with palliative and our office .   Patient will begin Eliquis 58m Twice daily  For 7 days , then 561mTwice daily .   After 3 months will consider decrease to 2.20m19mwice daily  - due to age and fraility .   Patient will discuss with palliative for home check on 09/03/20.  Will come to office 09/07/20 at 11am for follow up .   Aware to avoid NSAIDs   Report if any bleeding   If sx worsen with increased dyspnea, chest pain , hypoxia/increased oxygen demands. Call immediately or go to ER .

## 2020-09-03 DIAGNOSIS — Z515 Encounter for palliative care: Secondary | ICD-10-CM | POA: Diagnosis not present

## 2020-09-03 DIAGNOSIS — J8410 Pulmonary fibrosis, unspecified: Secondary | ICD-10-CM | POA: Diagnosis not present

## 2020-09-03 DIAGNOSIS — I749 Embolism and thrombosis of unspecified artery: Secondary | ICD-10-CM | POA: Diagnosis not present

## 2020-09-07 ENCOUNTER — Ambulatory Visit (INDEPENDENT_AMBULATORY_CARE_PROVIDER_SITE_OTHER): Payer: PPO | Admitting: Adult Health

## 2020-09-07 ENCOUNTER — Encounter: Payer: Self-pay | Admitting: Adult Health

## 2020-09-07 ENCOUNTER — Other Ambulatory Visit: Payer: Self-pay

## 2020-09-07 DIAGNOSIS — J84112 Idiopathic pulmonary fibrosis: Secondary | ICD-10-CM | POA: Diagnosis not present

## 2020-09-07 DIAGNOSIS — J9611 Chronic respiratory failure with hypoxia: Secondary | ICD-10-CM | POA: Diagnosis not present

## 2020-09-07 DIAGNOSIS — I2699 Other pulmonary embolism without acute cor pulmonale: Secondary | ICD-10-CM | POA: Diagnosis not present

## 2020-09-07 DIAGNOSIS — E119 Type 2 diabetes mellitus without complications: Secondary | ICD-10-CM | POA: Insufficient documentation

## 2020-09-07 DIAGNOSIS — M109 Gout, unspecified: Secondary | ICD-10-CM | POA: Insufficient documentation

## 2020-09-07 MED ORDER — ELIQUIS 5 MG PO TABS: 5.0000 mg | ORAL_TABLET | Freq: Two times a day (BID) | ORAL | 3 refills | Status: DC

## 2020-09-07 MED ORDER — ALBUTEROL SULFATE (2.5 MG/3ML) 0.083% IN NEBU: 2.5000 mg | INHALATION_SOLUTION | Freq: Four times a day (QID) | RESPIRATORY_TRACT | 3 refills | Status: DC | PRN

## 2020-09-07 MED ORDER — ALBUTEROL SULFATE HFA 108 (90 BASE) MCG/ACT IN AERS: 2.0000 | INHALATION_SPRAY | Freq: Four times a day (QID) | RESPIRATORY_TRACT | 2 refills | Status: DC | PRN

## 2020-09-07 NOTE — Assessment & Plan Note (Signed)
Continue on current O2 setting  No increased demands  Plan  Patient Instructions  Continue on Eliquis 44m Twice daily .  Continue on Prednisone 176mdaily.  Continue on Oxygen 3l/m at rest and 4l/m with activity, goal is to keep O2 sats >88-90%.  Saline nasal gel Twice daily  .  Activity as tolerated.  Albuterol inhaler /Neb as needed  Continue with Palliative care .  Avoid NSAIDS - advil, motrin, ibuprofen.  Set up 2 D echo in 3 months.  Follow up with Dr. MaVaughan Brownerr Shakeda Pearse NP  in 4 weeks Please contact office for sooner follow up if symptoms do not improve or worsen or seek emergency care

## 2020-09-07 NOTE — Progress Notes (Signed)
_0  ID: Jeffrey Brennan, male    DOB: 05/30/1940, 80 y.o.   MRN: [id number]  Chief Complaint  Patient presents with  . 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  Patient was seen last visit with ongoing progressive shortness of breath.  2D echo in late April showed preserved EF.  Decreased right ventricular systolic function, severely elevated pulmonary artery pressure at 63 mmHg.  Patient was set up for a VQ scan.  September 01, 2020 findings concerning for a small pulmonary embolism in the medial right middle lobe.Patient is being followed by palliative care at home.  Patient was started on Eliquis starter pack with 10 mg twice daily for 1 week and then transition to 5 mg twice daily. Remains on oxygen 3 L at rest and 4 L with activity. Patient says he is doing okay  at home.  Has had no signs of bleeding. Patient says overall he is doing okay.  He still gets short of breath with minimum activities.  He has high symptom burden and very low activity tolerance.  He does have severe coughing paroxysms.  Prednisone was added several weeks ago and seems to be helping some.  He is currently on prednisone 10 mg daily.  Lives alone. Drives. Son checks on him.  Still able to use POC , today on O2 at 3l/m . O2 sats 98% at rest .  He has had no increased oxygen demands. Patient education given on anticoagulants. No calf pain or swelling     Allergies  Allergen Reactions  . Benadryl Allergy [Diphenhydramine Hcl]     Does not know what it does to him  . Reglan [Metoclopramide] Other (See  Comments)    Makes me crazy   . Doxycycline Rash    Immunization History  Administered Date(s) Administered  . Fluad Quad(high Dose 65+) 01/17/2019, 01/09/2020  . Influenza, High Dose Seasonal PF 02/16/2017, 01/17/2019  . Influenza,inj,Quad PF,6+ Mos 01/16/2018  . Influenza-Unspecified 03/19/2016, 01/17/2019, 12/19/2019  . Moderna Sars-Covid-2 Vaccination 05/15/2019, 06/13/2019, 02/02/2020  . Pneumococcal Conjugate-13 07/08/2013  . Pneumococcal Polysaccharide-23 11/05/2006  . Tdap 07/03/2008, 11/22/2018  . Zoster Recombinat (Shingrix) 11/22/2018, 02/12/2019    Past Medical History:  Diagnosis Date  . Chronic renal insufficiency   . CLL (chronic lymphocytic leukemia) ([place])   . Diabetes ([place])   . Gout   . Hypertension     Tobacco History: Social History   Tobacco Use  Smoking Status Former Smoker  . Packs/day: 0.25  . Years: 3.00  . Pack years: 0.75  . Types: Cigarettes, Cigars  . Quit date: 75  . Years since quitting: 52.4  Smokeless Tobacco Never Used   Counseling given: Not Answered   Outpatient Medications Prior to Visit  Medication Sig Dispense Refill  . albuterol (PROVENTIL) (2.5 MG/3ML) 0.083% nebulizer solution Take 2.5 mg by nebulization every 6 (six) hours as needed for wheezing or shortness of breath.    Marland Kitchen albuterol (VENTOLIN HFA) 108 (90 Base) MCG/ACT inhaler Inhale 2 puffs into the lungs every 6 (six) hours as needed for wheezing or shortness of breath. 1 each 2  .  allopurinol (ZYLOPRIM) 300 MG tablet Take 300 mg by mouth daily.    . benzonatate (TESSALON) 200 MG capsule Take 1 capsule (200 mg total) by mouth 3 (three) times daily as needed for cough. 30 capsule 1  . citalopram (CELEXA) 40 MG tablet Take 40 mg by mouth daily.    Marland Kitchen ELIQUIS 5 MG TABS tablet Take by mouth.    . fluticasone (FLONASE) 50 MCG/ACT nasal spray Place 2 sprays into both nostrils daily.    . furosemide (LASIX) 20 MG tablet Take 1 tablet (20 mg total) by mouth daily. 30 tablet 1   . glimepiride (AMARYL) 2 MG tablet Take 2 mg by mouth every morning.    . halobetasol (ULTRAVATE) 0.05 % ointment SMARTSIG:2.5 Gram(s) Topical Twice Daily    . loratadine (CLARITIN) 10 MG tablet Take 10 mg by mouth daily.    Marland Kitchen omeprazole (PRILOSEC) 20 MG capsule Take 20 mg by mouth. 1 capsule in the evening    . omeprazole (PRILOSEC) 40 MG capsule Take 40 mg by mouth daily.     . potassium chloride (KLOR-CON) 10 MEQ tablet Take 1 tablet (10 mEq total) by mouth daily. Take While taking Lasix/furosemide 30 tablet 2  . pramipexole (MIRAPEX) 0.25 MG tablet Take 0.25 mg by mouth 2 (two) times daily.    . predniSONE (DELTASONE) 10 MG tablet Take 1 tablet (10 mg total) by mouth daily with breakfast. 30 tablet 1  . pramipexole (MIRAPEX) 0.125 MG tablet Take 0.125 mg by mouth in the morning and at bedtime.    [name] (EUCRISA) 2 % OINT Apply topically as needed.     . APIXABAN (ELIQUIS) VTE STARTER PACK (10MG AND 5MG) Take as directed on package: start with two-85m tablets twice daily for 7 days. On day 8, switch to one-579mtablet twice daily. 1 each 0  . budesonide-formoterol (SYMBICORT) 160-4.5 MCG/ACT inhaler Inhale 2 puffs into the lungs 2 (two) times daily. (Patient not taking: No sig reported) 10.2 g 12  . predniSONE (DELTASONE) 20 MG tablet Take 1 tablet (20 mg total) by mouth daily with breakfast. 5 tablet 0   No facility-administered medications prior to visit.     Review of Systems:   Constitutional:   No  weight loss, night sweats,  Fevers, chills,  +fatigue, or  lassitude.  HEENT:   No headaches,  Difficulty swallowing,  Tooth/dental problems, or  Sore throat,                No sneezing, itching, ear ache, nasal congestion, post nasal drip,   CV:  No chest pain,  Orthopnea, PND, +swelling in lower extremities, no  anasarca, dizziness, palpitations, syncope.   GI  No heartburn, indigestion, abdominal pain, nausea, vomiting, diarrhea, change in bowel habits, loss of appetite,  bloody stools.   Resp:  .  No chest wall deformity  Skin: no rash or lesions.  GU: no dysuria, change in color of urine, no urgency or frequency.  No flank pain, no hematuria   MS:  No joint pain or swelling.  No decreased range of motion.  No back pain.    Physical Exam  BP 116/62   Pulse 90   Ht _0  (1.753 m)   Wt 158 lb (71.7 kg)   SpO2 98% Comment: 3L  BMI 23.33 kg/m   GEN: A/Ox3; pleasant , NAD, chronically ill-appearing on oxygen in wheelchair   HEENT:  [place]/AT,  , NOSE-clear, THROAT-clear, no lesions, no postnasal drip or exudate noted.  NECK:  Supple w/ fair ROM; no JVD; normal carotid impulses w/o bruits; no thyromegaly or nodules palpated; no lymphadenopathy.    RESP bibasilar crackles. no accessory muscle use, no dullness to percussion  CARD:  RRR, no m/r/g, tr -1 +peripheral edema, pulses intact, no cyanosis or clubbing.  GI:   Soft & nt; nml bowel sounds; no organomegaly or masses detected.   Musco: Warm bil, no deformities or joint swelling noted.   Neuro: alert, no focal deficits noted.    Skin: Warm, no lesions or rashes    Lab Results:  CBC    Component Value Date/Time   WBC 19.7 (H) 06/14/2020 0527   RBC 3.81 (L) 06/14/2020 0527   HGB 11.0 (L) 06/14/2020 0527   HCT 34.2 (L) 06/14/2020 0527   PLT 242 06/14/2020 0527   MCV 89.8 06/14/2020 0527   MCH 28.9 06/14/2020 0527   MCHC 32.2 06/14/2020 0527   RDW 16.7 (H) 06/14/2020 0527   LYMPHSABS 13.4 (H) 06/11/2020 2104   MONOABS 0.8 06/11/2020 2104   EOSABS 0.9 (H) 06/11/2020 2104   BASOSABS 0.1 06/11/2020 2104    BMET    Component Value Date/Time   NA 139 06/14/2020 0527   K 3.7 06/14/2020 0527   CL 107 06/14/2020 0527   CO2 22 06/14/2020 0527   GLUCOSE 84 06/14/2020 0527   BUN 42 (H) 06/14/2020 0527   CREATININE 1.61 (H) 06/14/2020 0527   CALCIUM 8.9 06/14/2020 0527   GFRNONAA 44 (L) 06/14/2020 0527    BNP    Component Value Date/Time   BNP 38.0 06/11/2020 2009     ProBNP    Component Value Date/Time   PROBNP 248 03/10/2020 1111    Imaging: DG Chest 2 View  Result Date: 09/01/2020 CLINICAL DATA:  Dyspnea with elevated D-dimer.  For V/Q scan EXAM: CHEST - 2 VIEW COMPARISON:  Most recent radiograph 07/06/2020.  CT 05/13/2020 FINDINGS: Interstitial coarsening with areas of peripheral reticulation and bronchiectasis, not significantly changed from prior exam. No superimposed acute airspace disease. Stable upper normal heart size. Unchanged mediastinal contours. No pneumothorax or pleural effusion. No acute osseous abnormalities are seen. IMPRESSION: Stable radiographic appearance of the chest with chronic interstitial lung disease. No acute radiographic findings. Electronically Signed   By: Keith Rake M.D.   On: 09/01/2020 18:12   NM Pulmonary Perfusion  Addendum Date: 09/01/2020   ADDENDUM REPORT: 09/01/2020 12:06 ADDENDUM: These results will be called to the ordering clinician or representative by the Radiologist Assistant, and communication documented in the PACS or [employer]. Electronically Signed   By: Suzy Bouchard M.D.   On: 09/01/2020 12:06   Result Date: 09/01/2020 CLINICAL DATA:  Elevated D-dimer (0.72).  Chronic short of breath. EXAM: NUCLEAR MEDICINE PERFUSION LUNG SCAN TECHNIQUE: Perfusion images were obtained in multiple projections after intravenous injection of radiopharmaceutical. RADIOPHARMACEUTICALS:  4.3 mCi Tc-9mMAA COMPARISON:  None. FINDINGS: Small wedge-shaped peripheral perfusion defect within the anteromedial aspect of the RIGHT middle lobe seen on two views (RPO and AP). No additional wedge-shaped peripheral perfusion defects. On the chest radiograph same day, there is bilateral interstitial fibrosis noted. No clear corresponding RIGHT middle lobe process. IMPRESSION: Findings concerning for a small pulmonary embolism within the medial RIGHT middle lobe. Underlying pulmonary fibrosis. Electronically Signed: By: SSuzy BouchardM.D. On: 09/01/2020 11:52      PFT Results Latest Ref Rng & Units 05/18/2020 05/29/2019  FVC-Pre L 2.42 2.62  FVC-Predicted Pre % 63 68  FVC-Post L 2.34  2.63  FVC-Predicted Post % 61 68  Pre FEV1/FVC % % 90 90  Post FEV1/FCV % % 93 92  FEV1-Pre L 2.19 2.37  FEV1-Predicted Pre % 81 86  FEV1-Post L 2.17 2.42  DLCO uncorrected ml/min/mmHg 10.94 13.80  DLCO UNC% % 47 59  DLCO corrected ml/min/mmHg 10.94 13.80  DLCO COR %Predicted % 47 59  DLVA Predicted % 81 85  TLC L 3.59 4.06  TLC % Predicted % 54 61  RV % Predicted % 21 51    No results found for: NITRICOXIDE      Assessment & Plan:   Pulmonary embolism (HCC) Newly diagnosed unprovoked PE (VQ scan positive for small PE in the medial right middle lobe)- Has associated decreased right ventricular systolic function and severely elevated pulmonary artery pressure Patient is doing well on Eliquis.  Patient education was given. Will need repeat 2D echo in 3 months. If patient doing well at 6 months consider decreasing Eliquis dose to 2.5 mg twice daily as will need lifelong  No calf pain or swelling / no change in therapy if dvt - so no need for venous dopplers at this time.   Plan  Patient Instructions  Continue on Eliquis 57m Twice daily .  Continue on Prednisone 180mdaily.  Continue on Oxygen 3l/m at rest and 4l/m with activity, goal is to keep O2 sats >88-90%.  Saline nasal gel Twice daily  .  Activity as tolerated.  Albuterol inhaler /Neb as needed  Continue with Palliative care .  Avoid NSAIDS - advil, motrin, ibuprofen.  Set up 2 D echo in 3 months.  Follow up with Dr. MaVaughan Brownerr Noella Kipnis NP  in 4 weeks Please contact office for sooner follow up if symptoms do not improve or worsen or seek emergency care       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[place] IPF with high symptom burden.  Patient's been intolerant to Ofev and Esbriet.  Patient is continue with home oxygen.  Activity as tolerated.   Home palliative care. Patient does have some clinical improvement on low-dose steroids.  If stable on return visit consider decreasing prednisone to 5 mg daily  Plan  Patient Instructions  Continue on Eliquis 31m431mwice daily .  Continue on Prednisone 28m8mily.  Continue on Oxygen 3l/m at rest and 4l/m with activity, goal is to keep O2 sats >88-90%.  Saline nasal gel Twice daily  .  Activity as tolerated.  Albuterol inhaler /Neb as needed  Continue with Palliative care .  Avoid NSAIDS - advil, motrin, ibuprofen.  Set up 2 D echo in 3 months.  Follow up with Dr. MannVaughan BrownerParrett NP  in 4 weeks Please contact office for sooner follow up if symptoms do not improve or worsen or seek emergency care        Chronic respiratory failure with hypoxia (HCC)[place] setting  No increased demands  Plan  Patient Instructions  Continue on Eliquis 31mg 47mce daily .  Continue on Prednisone 28mg 24my.  Continue on Oxygen 3l/m at rest and 4l/m with activity, goal is to keep O2 sats >88-90%.  Saline nasal gel Twice daily  .  Activity as tolerated.  Albuterol inhaler /Neb as needed  Continue with Palliative care .  Avoid NSAIDS - advil, motrin, ibuprofen.  Set up 2 D echo in 3 months.  Follow up with Dr. MannamVaughan Brownerrrett NP  in 4 weeks Please contact office for sooner follow up if symptoms  do not improve or worsen or seek emergency care          Rexene Edison, NP 09/07/2020

## 2020-09-07 NOTE — Telephone Encounter (Signed)
Called Dale and she states nothing further is needed. Pt has seen TP twice since phone note started. Will close encounter.

## 2020-09-07 NOTE — Assessment & Plan Note (Addendum)
Newly diagnosed unprovoked PE (VQ scan positive for small PE in the medial right middle lobe)- Has associated decreased right ventricular systolic function and severely elevated pulmonary artery pressure Patient is doing well on Eliquis.  Patient education was given. Will need repeat 2D echo in 3 months. If patient doing well at 6 months consider decreasing Eliquis dose to 2.5 mg twice daily as will need lifelong  No calf pain or swelling / no change in therapy if dvt - so no need for venous dopplers at this time.   Plan  Patient Instructions  Continue on Eliquis 8m Twice daily .  Continue on Prednisone 146mdaily.  Continue on Oxygen 3l/m at rest and 4l/m with activity, goal is to keep O2 sats >88-90%.  Saline nasal gel Twice daily  .  Activity as tolerated.  Albuterol inhaler /Neb as needed  Continue with Palliative care .  Avoid NSAIDS - advil, motrin, ibuprofen.  Set up 2 D echo in 3 months.  Follow up with Dr. MaVaughan Brownerr Jossilyn Benda NP  in 4 weeks Please contact office for sooner follow up if symptoms do not improve or worsen or seek emergency care

## 2020-09-07 NOTE — Patient Instructions (Addendum)
Continue on Eliquis 30m Twice daily .  Continue on Prednisone 148mdaily.  Continue on Oxygen 3l/m at rest and 4l/m with activity, goal is to keep O2 sats >88-90%.  Saline nasal gel Twice daily  .  Activity as tolerated.  Albuterol inhaler /Neb as needed  Continue with Palliative care .  Avoid NSAIDS - advil, motrin, ibuprofen.  Set up 2 D echo in 3 months.  Follow up with Dr. MaVaughan Brownerr Paetyn Pietrzak NP  in 4 weeks Please contact office for sooner follow up if symptoms do not improve or worsen or seek emergency care

## 2020-09-07 NOTE — Assessment & Plan Note (Signed)
Progressive IPF with high symptom burden.  Patient's been intolerant to Ofev and Esbriet.  Patient is continue with home oxygen.  Activity as tolerated.  Home palliative care. Patient does have some clinical improvement on low-dose steroids.  If stable on return visit consider decreasing prednisone to 5 mg daily  Plan  Patient Instructions  Continue on Eliquis 27m Twice daily .  Continue on Prednisone 148mdaily.  Continue on Oxygen 3l/m at rest and 4l/m with activity, goal is to keep O2 sats >88-90%.  Saline nasal gel Twice daily  .  Activity as tolerated.  Albuterol inhaler /Neb as needed  Continue with Palliative care .  Avoid NSAIDS - advil, motrin, ibuprofen.  Set up 2 D echo in 3 months.  Follow up with Dr. MaVaughan Brownerr Jaanvi Fizer NP  in 4 weeks Please contact office for sooner follow up if symptoms do not improve or worsen or seek emergency care

## 2020-09-08 ENCOUNTER — Other Ambulatory Visit (HOSPITAL_COMMUNITY): Payer: PPO

## 2020-09-15 DIAGNOSIS — J189 Pneumonia, unspecified organism: Secondary | ICD-10-CM | POA: Diagnosis not present

## 2020-09-15 DIAGNOSIS — J9601 Acute respiratory failure with hypoxia: Secondary | ICD-10-CM | POA: Diagnosis not present

## 2020-09-17 DIAGNOSIS — J8410 Pulmonary fibrosis, unspecified: Secondary | ICD-10-CM | POA: Diagnosis not present

## 2020-09-17 DIAGNOSIS — I749 Embolism and thrombosis of unspecified artery: Secondary | ICD-10-CM | POA: Diagnosis not present

## 2020-09-17 DIAGNOSIS — Z515 Encounter for palliative care: Secondary | ICD-10-CM | POA: Diagnosis not present

## 2020-10-08 ENCOUNTER — Ambulatory Visit: Payer: Self-pay | Admitting: Pulmonary Disease

## 2020-10-08 ENCOUNTER — Other Ambulatory Visit: Payer: Self-pay | Admitting: *Deleted

## 2020-10-08 DIAGNOSIS — J189 Pneumonia, unspecified organism: Secondary | ICD-10-CM

## 2020-10-11 ENCOUNTER — Ambulatory Visit: Payer: PPO | Admitting: Adult Health

## 2020-10-11 ENCOUNTER — Other Ambulatory Visit: Payer: Self-pay

## 2020-10-11 ENCOUNTER — Encounter: Payer: Self-pay | Admitting: Adult Health

## 2020-10-11 ENCOUNTER — Ambulatory Visit (INDEPENDENT_AMBULATORY_CARE_PROVIDER_SITE_OTHER): Payer: PPO

## 2020-10-11 DIAGNOSIS — J9611 Chronic respiratory failure with hypoxia: Secondary | ICD-10-CM

## 2020-10-11 DIAGNOSIS — J84112 Idiopathic pulmonary fibrosis: Secondary | ICD-10-CM | POA: Diagnosis not present

## 2020-10-11 DIAGNOSIS — J189 Pneumonia, unspecified organism: Secondary | ICD-10-CM | POA: Diagnosis not present

## 2020-10-11 NOTE — Patient Instructions (Addendum)
Continue on Eliquis 21m Twice daily .  Continue on Prednisone 174mdaily.  Continue on Oxygen 3l/m at rest and 4l/m with activity, goal is to keep O2 sats >88-90%.  Saline nasal gel Twice daily  .  Activity as tolerated.  Albuterol inhaler /Neb as needed  Continue with Palliative care .  Avoid NSAIDS - advil, motrin, ibuprofen.  2 D echo as planned this fall.  Follow up with Dr. MaVaughan Brownerr Eliya Geiman NP in 2 months and As needed   Please contact office for sooner follow up if symptoms do not improve or worsen or seek emergency care

## 2020-10-11 NOTE — Assessment & Plan Note (Signed)
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 Patient will continue on Eliquis for total of 6 months and then consider decreasing to lower dose Eliquis 2.5 mg twice daily lifelong. Needs follow-up echo at 3 months.  Plan  Patient Instructions  Continue on Eliquis 76m Twice daily .  Continue on Prednisone 171mdaily.  Continue on Oxygen 3l/m at rest and 4l/m with activity, goal is to keep O2 sats >88-90%.  Saline nasal gel Twice daily  .  Activity as tolerated.  Albuterol inhaler /Neb as needed  Continue with Palliative care .  Avoid NSAIDS - advil, motrin, ibuprofen.  2 D echo as planned this fall.  Follow up with Dr. MaVaughan Brownerr Danielly Ackerley NP in 2 months and As needed   Please contact office for sooner follow up if symptoms do not improve or worsen or seek emergency care

## 2020-10-11 NOTE — Assessment & Plan Note (Addendum)
Progressive IPF.  Unable to tolerate antifibrotic's.  Patient has high symptom burden. He is to continue on prednisone 10 mg daily.  On return visit could consider decreasing to 5 mg daily.  Continue on oxygen to maintain O2 saturations greater than 88 to 90%.  Activity as tolerated. Follow-up chest x-ray was done today.  This does show some increased markings along the left lower base.  Patient has no increased symptom burden, suspect this is chronic changes.  Hold on any antibiotics at this time.

## 2020-10-11 NOTE — Progress Notes (Signed)
_0  ID: Jeffrey Brennan, male    DOB: 1940-04-08, 80 y.o.   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  Diagnosed with PE September 01, 2020 started on Eliquis Is started on antifibrotic's December 2020 unfortunately was unable to tolerate either antifibrotic. Medical history significant for CLL, diabetes, chronic kidney disease  TEST/EVENTS :  2D echo in late April showed preserved EF.  Decreased right ventricular systolic function, severely elevated pulmonary artery pressure at 63 mmHg.  Patient was set up for a VQ scan.  September 01, 2020 findings concerning for a small pulmonary embolism in the medial right middle lobe  10/11/2020 Follow up : IPF, O2 RF , PE  Patient returns for a 1 month follow-up.  Last month patient was diagnosed with a small PE started on Eliquis.  2D echo in April had showed a preserved EF and decreased right ventricular systolic function and severely elevated pulmonary artery pressure at 63 mmHg.  Patient was set up for a VQ scan that was done September 01, 2020.  This showed concerning findings for small pulmonary embolism in the right middle lobe.  Patient was started on Eliquis.  Patient says he is doing well on Eliquis but has no known issues.  Denies any hemoptysis or known bleeding.  He has had no increased oxygen demands.  Remains mainly on 3 L of oxygen.  He still remains independent.  Drives.  Cannot walk any long distance.  Palliative care is following him at home.  With patient's previous increased symptom burden.  He was placed on prednisone 10 mg daily.  He says this has really helped him.  He previously was unable to tolerate antifibrotic's.  He denies any increased cough. Since last visit patient says he is doing better.  Feels that his breathing has improved some.     Allergies  Allergen  Reactions   Benadryl Allergy [Diphenhydramine Hcl]     Does not know what it does to him   Reglan [Metoclopramide] Other (See Comments)    Makes me crazy    Doxycycline Rash    Immunization History  Administered Date(s) Administered   Fluad Quad(high Dose 65+) 01/17/2019, 01/09/2020   Influenza, High Dose Seasonal PF 02/16/2017, 01/17/2019   Influenza,inj,Quad PF,6+ Mos 01/16/2018   Influenza-Unspecified 03/19/2016, 01/17/2019, 12/19/2019   Moderna Sars-Covid-2 Vaccination 05/15/2019, 06/13/2019, 02/02/2020   Pneumococcal Conjugate-13 07/08/2013   Pneumococcal Polysaccharide-23 11/05/2006   Tdap 07/03/2008, 11/22/2018   Zoster Recombinat (Shingrix) 11/22/2018, 02/12/2019    Past Medical History:  Diagnosis Date   Chronic renal insufficiency    CLL (chronic lymphocytic leukemia) ([place])    Diabetes ([place])    Gout    Hypertension     Tobacco History: Social History   Tobacco Use  Smoking Status Former   Packs/day: 0.25   Years: 3.00   Pack years: 0.75   Types: Cigarettes, Cigars   Quit date: 1970   Years since quitting: 52.5  Smokeless Tobacco Never   Counseling given: Not Answered   Outpatient Medications Prior to Visit  Medication Sig Dispense Refill   albuterol (PROVENTIL) (2.5 MG/3ML) 0.083% nebulizer solution Take 3 mLs (2.5 mg total) by nebulization every 6 (six) hours as needed for wheezing or shortness of breath. 75 mL 3   albuterol (VENTOLIN HFA) 108 (90 Base) MCG/ACT inhaler Inhale 2  puffs into the lungs every 6 (six) hours as needed for wheezing or shortness of breath. 1 each 2   allopurinol (ZYLOPRIM) 300 MG tablet Take 300 mg by mouth daily.     apixaban (ELIQUIS) 5 MG TABS tablet Take 1 tablet (5 mg total) by mouth 2 (two) times daily. 60 tablet 3   citalopram (CELEXA) 40 MG tablet Take 40 mg by mouth daily.     Crisaborole (EUCRISA) 2 % OINT Apply topically as needed.      fluticasone (FLONASE) 50 MCG/ACT nasal spray Place 2 sprays into both nostrils  daily.     furosemide (LASIX) 20 MG tablet Take 1 tablet (20 mg total) by mouth daily. 30 tablet 1   glimepiride (AMARYL) 2 MG tablet Take 2 mg by mouth every morning.     halobetasol (ULTRAVATE) 0.05 % ointment SMARTSIG:2.5 Gram(s) Topical Twice Daily     loratadine (CLARITIN) 10 MG tablet Take 10 mg by mouth daily.     omeprazole (PRILOSEC) 20 MG capsule Take 20 mg by mouth. 1 capsule in the evening     omeprazole (PRILOSEC) 40 MG capsule Take 40 mg by mouth daily.      potassium chloride (KLOR-CON) 10 MEQ tablet Take 1 tablet (10 mEq total) by mouth daily. Take While taking Lasix/furosemide 30 tablet 2   pramipexole (MIRAPEX) 0.25 MG tablet Take 0.25 mg by mouth 2 (two) times daily.     predniSONE (DELTASONE) 10 MG tablet Take 1 tablet (10 mg total) by mouth daily with breakfast. 30 tablet 1   benzonatate (TESSALON) 200 MG capsule Take 1 capsule (200 mg total) by mouth 3 (three) times daily as needed for cough. (Patient not taking: Reported on 10/11/2020) 30 capsule 1   ELIQUIS 5 MG TABS tablet Take by mouth.     No facility-administered medications prior to visit.     Review of Systems:   Constitutional:   No  weight loss, night sweats,  Fevers, chills,  +fatigue, or  lassitude.  HEENT:   No headaches,  Difficulty swallowing,  Tooth/dental problems, or  Sore throat,                No sneezing, itching, ear ache, nasal congestion, post nasal drip,   CV:  No chest pain,  Orthopnea, PND, swelling in lower extremities, anasarca, dizziness, palpitations, syncope.   GI  No heartburn, indigestion, abdominal pain, nausea, vomiting, diarrhea, change in bowel habits, loss of appetite, bloody stools.   Resp:   No chest wall deformity  Skin: no rash or lesions.  GU: no dysuria, change in color of urine, no urgency or frequency.  No flank pain, no hematuria   MS:  No joint pain or swelling.  No decreased range of motion.  No back pain.    Physical Exam  BP 130/62 (BP Location: Left  Arm, Cuff Size: Normal)   Pulse 73   Temp 97.9 F (36.6 C) (Temporal)   Ht _0  (1.753 m)   Wt 166 lb 12.8 oz (75.7 kg)   SpO2 97%   BMI 24.63 kg/m   GEN: A/Ox3; pleasant , NAD, well nourished , elderly, on oxygen, in wheelchair   HEENT:  [place]/AT, NOSE-clear, THROAT-clear, no lesions, no postnasal drip or exudate noted.   NECK:  Supple w/ fair ROM; no JVD; normal carotid impulses w/o bruits; no thyromegaly or nodules palpated; no lymphadenopathy.    RESP bibasilar crackles  no accessory muscle use, no dullness to percussion  CARD:  RRR, no m/r/g, tr  peripheral edema, pulses intact, no cyanosis or clubbing.  GI:   Soft & nt; nml bowel sounds; no organomegaly or masses detected.   Musco: Warm bil, no deformities or joint swelling noted.   Neuro: alert, no focal deficits noted.    Skin: Warm, no lesions or rashes    Lab Results:  CBC      Imaging: DG Chest 2 View  Result Date: 10/11/2020 CLINICAL DATA:  Community-acquired pneumonia. EXAM: CHEST - 2 VIEW COMPARISON:  09/01/2020 and CT chest 05/13/2020. FINDINGS: Trachea is midline. Heart size stable. Slightly increased density in the left lower lobe, superimposed on diffuse parenchymal coarsening. No pleural fluid. IMPRESSION: Possible left lower lobe pneumonia superimposed on chronic interstitial lung disease. Electronically Signed   By: Lorin Picket M.D.   On: 10/11/2020 10:53      PFT Results Latest Ref Rng & Units 05/18/2020 05/29/2019  FVC-Pre L 2.42 2.62  FVC-Predicted Pre % 63 68  FVC-Post L 2.34 2.63  FVC-Predicted Post % 61 68  Pre FEV1/FVC % % 90 90  Post FEV1/FCV % % 93 92  FEV1-Pre L 2.19 2.37  FEV1-Predicted Pre % 81 86  FEV1-Post L 2.17 2.42  DLCO uncorrected ml/min/mmHg 10.94 13.80  DLCO UNC% % 47 59  DLCO corrected ml/min/mmHg 10.94 13.80  DLCO COR %Predicted % 47 59  DLVA Predicted % 81 85  TLC L 3.59 4.06  TLC % Predicted % 54 61  RV % Predicted % 21 51    No results found for:  NITRICOXIDE      Assessment & Plan:   Pulmonary embolism (HCC) 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 Patient will continue on Eliquis for total of 6 months and then consider decreasing to lower dose Eliquis 2.5 mg twice daily lifelong. Needs follow-up echo at 3 months.  Plan  Patient Instructions  Continue on Eliquis 34m Twice daily .  Continue on Prednisone 139mdaily.  Continue on Oxygen 3l/m at rest and 4l/m with activity, goal is to keep O2 sats >88-90%.  Saline nasal gel Twice daily  .  Activity as tolerated.  Albuterol inhaler /Neb as needed  Continue with Palliative care .  Avoid NSAIDS - advil, motrin, ibuprofen.  2 D echo as planned this fall.  Follow up with Dr. MaVaughan Brownerr Lillian Tigges NP in 2 months and As needed   Please contact office for sooner follow up if symptoms do not improve or worsen or seek emergency care       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[facility] IP[facility].  Unable to tolerate antifibrotic's.  Patient has high symptom burden. He is to continue on prednisone 10 mg daily.  On return visit could consider decreasing to 5 mg daily.  Continue on oxygen to maintain O2 saturations greater than 88 to 90%.  Activity as tolerated. Follow-up chest x-ray was done today.  This does show some increased markings along the left lower base.  Patient has no increased symptom burden, suspect this is chronic changes.  Hold on any antibiotics at this time.  Chronic respiratory failure with hypoxia (HCC) No increased oxygen mans.  Continue on O2 to maintain O2 saturation greater than 88 to 90%.     TaRexene EdisonNP 10/11/2020

## 2020-10-11 NOTE — Assessment & Plan Note (Signed)
No increased oxygen mans.  Continue on O2 to maintain O2 saturation greater than 88 to 90%.

## 2020-10-18 ENCOUNTER — Other Ambulatory Visit: Payer: Self-pay | Admitting: Adult Health

## 2020-10-19 DIAGNOSIS — D225 Melanocytic nevi of trunk: Secondary | ICD-10-CM | POA: Diagnosis not present

## 2020-10-19 DIAGNOSIS — L821 Other seborrheic keratosis: Secondary | ICD-10-CM | POA: Diagnosis not present

## 2020-10-19 DIAGNOSIS — Z85828 Personal history of other malignant neoplasm of skin: Secondary | ICD-10-CM | POA: Diagnosis not present

## 2020-10-19 DIAGNOSIS — D692 Other nonthrombocytopenic purpura: Secondary | ICD-10-CM | POA: Diagnosis not present

## 2020-10-19 DIAGNOSIS — L570 Actinic keratosis: Secondary | ICD-10-CM | POA: Diagnosis not present

## 2020-10-19 DIAGNOSIS — L820 Inflamed seborrheic keratosis: Secondary | ICD-10-CM | POA: Diagnosis not present

## 2020-10-20 ENCOUNTER — Ambulatory Visit: Payer: PPO | Admitting: Orthopaedic Surgery

## 2020-10-20 ENCOUNTER — Encounter: Payer: Self-pay | Admitting: Orthopaedic Surgery

## 2020-10-20 VITALS — Ht 69.0 in | Wt 164.0 lb

## 2020-10-20 DIAGNOSIS — M7061 Trochanteric bursitis, right hip: Secondary | ICD-10-CM | POA: Diagnosis not present

## 2020-10-20 DIAGNOSIS — Z96641 Presence of right artificial hip joint: Secondary | ICD-10-CM

## 2020-10-20 MED ORDER — METHYLPREDNISOLONE ACETATE 40 MG/ML IJ SUSP
40.0000 mg | INTRAMUSCULAR | Status: AC | PRN
  Administered 2020-10-20: 40 mg via INTRA_ARTICULAR

## 2020-10-20 MED ORDER — LIDOCAINE HCL 1 % IJ SOLN
3.0000 mL | INTRAMUSCULAR | Status: AC | PRN
  Administered 2020-10-20: 3 mL
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
## 2020-10-21 ENCOUNTER — Telehealth: Payer: Self-pay | Admitting: Adult Health

## 2020-10-21 MED ORDER — AZITHROMYCIN 250 MG PO TABS: 250.0000 mg | ORAL_TABLET | ORAL | 0 refills | Status: DC

## 2020-10-21 NOTE — Telephone Encounter (Signed)
Spoke with the pt  He states that he is having increased cough x 2 wks  Cough has been non prod  He is having increased SOB with coughing spells  He also c/o chest tightness  He has not had any fever or chills  He is still on his 10 mg pred daily, using albuterol inhaler and neb a few times per day  Taking tessalon pearles tid but not helping  Please advise thank you!  Allergies  Allergen Reactions   Benadryl Allergy [Diphenhydramine Hcl]     Does not know what it does to him   Reglan [Metoclopramide] Other (See Comments)    Makes me crazy    Doxycycline Rash

## 2020-10-21 NOTE — Telephone Encounter (Signed)
Spoke with pt and verified pt had picked up Z-Pak. He misunderstood instructions on prednisone increase. RN review instructions. Pt stated understanding and that he had enough prednisone to cover the increased dosage for 5 days. Nothing further needed at this time.

## 2020-10-21 NOTE — Telephone Encounter (Signed)
ZPack #1 take as directed  Increase Prednisone 32m daily for 1 week then 151mdaily  If not improving will need ov  Please contact office for sooner follow up if symptoms do not improve or worsen or seek emergency care

## 2020-10-21 NOTE — Telephone Encounter (Signed)
I called and spoke with the pt and notified of response per TP  He verbalized understanding  Rx for zpack sent to pharm Pt already has refill on pred

## 2020-10-23 DIAGNOSIS — K120 Recurrent oral aphthae: Secondary | ICD-10-CM | POA: Diagnosis not present

## 2020-10-23 DIAGNOSIS — Z6824 Body mass index (BMI) 24.0-24.9, adult: Secondary | ICD-10-CM | POA: Diagnosis not present

## 2020-10-29 DIAGNOSIS — I749 Embolism and thrombosis of unspecified artery: Secondary | ICD-10-CM | POA: Diagnosis not present

## 2020-10-29 DIAGNOSIS — Z515 Encounter for palliative care: Secondary | ICD-10-CM | POA: Diagnosis not present

## 2020-10-29 DIAGNOSIS — J8410 Pulmonary fibrosis, unspecified: Secondary | ICD-10-CM | POA: Diagnosis not present

## 2020-10-29 DIAGNOSIS — B001 Herpesviral vesicular dermatitis: Secondary | ICD-10-CM | POA: Diagnosis not present

## 2020-11-06 ENCOUNTER — Other Ambulatory Visit: Payer: Self-pay | Admitting: Adult Health

## 2020-11-10 DIAGNOSIS — K120 Recurrent oral aphthae: Secondary | ICD-10-CM | POA: Diagnosis not present

## 2020-11-10 DIAGNOSIS — Z6824 Body mass index (BMI) 24.0-24.9, adult: Secondary | ICD-10-CM | POA: Diagnosis not present

## 2020-11-18 DIAGNOSIS — Z515 Encounter for palliative care: Secondary | ICD-10-CM | POA: Diagnosis not present

## 2020-11-18 DIAGNOSIS — I749 Embolism and thrombosis of unspecified artery: Secondary | ICD-10-CM | POA: Diagnosis not present

## 2020-11-18 DIAGNOSIS — B001 Herpesviral vesicular dermatitis: Secondary | ICD-10-CM | POA: Diagnosis not present

## 2020-11-18 DIAGNOSIS — J8410 Pulmonary fibrosis, unspecified: Secondary | ICD-10-CM | POA: Diagnosis not present

## 2020-11-22 DIAGNOSIS — N183 Chronic kidney disease, stage 3 unspecified: Secondary | ICD-10-CM | POA: Diagnosis not present

## 2020-11-29 DIAGNOSIS — I129 Hypertensive chronic kidney disease with stage 1 through stage 4 chronic kidney disease, or unspecified chronic kidney disease: Secondary | ICD-10-CM | POA: Diagnosis not present

## 2020-11-29 DIAGNOSIS — N2581 Secondary hyperparathyroidism of renal origin: Secondary | ICD-10-CM | POA: Diagnosis not present

## 2020-11-29 DIAGNOSIS — R809 Proteinuria, unspecified: Secondary | ICD-10-CM | POA: Diagnosis not present

## 2020-11-29 DIAGNOSIS — N183 Chronic kidney disease, stage 3 unspecified: Secondary | ICD-10-CM | POA: Diagnosis not present

## 2020-11-29 DIAGNOSIS — R600 Localized edema: Secondary | ICD-10-CM | POA: Diagnosis not present

## 2020-12-06 ENCOUNTER — Other Ambulatory Visit (HOSPITAL_COMMUNITY): Payer: PPO

## 2020-12-07 ENCOUNTER — Other Ambulatory Visit: Payer: Self-pay

## 2020-12-07 ENCOUNTER — Telehealth: Payer: Self-pay | Admitting: Adult Health

## 2020-12-07 ENCOUNTER — Ambulatory Visit (HOSPITAL_COMMUNITY)
Admission: RE | Admit: 2020-12-07 | Discharge: 2020-12-07 | Disposition: A | Discharge status: Home / Self Care | Payer: PPO | Source: Ambulatory Visit | Attending: Adult Health | Admitting: Adult Health

## 2020-12-07 DIAGNOSIS — I2699 Other pulmonary embolism without acute cor pulmonale: Secondary | ICD-10-CM | POA: Diagnosis not present

## 2020-12-07 DIAGNOSIS — J84112 Idiopathic pulmonary fibrosis: Secondary | ICD-10-CM | POA: Diagnosis not present

## 2020-12-07 DIAGNOSIS — E119 Type 2 diabetes mellitus without complications: Secondary | ICD-10-CM | POA: Diagnosis not present

## 2020-12-07 DIAGNOSIS — J9611 Chronic respiratory failure with hypoxia: Secondary | ICD-10-CM | POA: Diagnosis not present

## 2020-12-07 DIAGNOSIS — I10 Essential (primary) hypertension: Secondary | ICD-10-CM | POA: Insufficient documentation

## 2020-12-07 LAB — ECHOCARDIOGRAM COMPLETE
Area-P 1/2: 2.85 cm2
S' Lateral: 2.4 cm

## 2020-12-07 MED ORDER — PREDNISONE 10 MG PO TABS: ORAL_TABLET | ORAL | 1 refills | Status: DC

## 2020-12-07 NOTE — Telephone Encounter (Signed)
I spoke with the hospice nurse, Colletta Maryland and notified of response per TP  She verbalized understanding  Nothing further needed

## 2020-12-07 NOTE — Telephone Encounter (Signed)
Last OV with TP on 10/11/2020 for IPF.   Spoke to stephanie with Hospice.  Colletta Maryland stated that hospice NP increased prednisone from 88m to 268mfor worsened sob and cough.  Patient would like to continue on prednisone 2053m SteColletta Marylandated that prescribing provider is out on medical leave.  She would like TP's thoughts on prednisone.   Tammy, please advise. Thanks

## 2020-12-07 NOTE — Telephone Encounter (Signed)
That is fine to stay for now . He has ov with me next week and we can discuss in detail at that [facility]

## 2020-12-07 NOTE — Progress Notes (Signed)
*  PRELIMINARY RESULTS* Echocardiogram 2D Echocardiogram has been performed.  Jeffrey Brennan 12/07/2020, 1:55 PM

## 2020-12-08 ENCOUNTER — Telehealth: Payer: Self-pay | Admitting: Adult Health

## 2020-12-08 NOTE — Telephone Encounter (Signed)
Primary Pulmonologist: Dr. Vaughan Browner Last office visit and with whom: Tammy parrett on 10/11/20 What do we see them for (pulmonary problems): IPF, chronic respiratory failure Last OV assessment/plan: see below  Was appointment offered to patient (explain)?    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 Patient will continue on Eliquis for total of 6 months and then consider decreasing to lower dose Eliquis 2.5 mg twice daily lifelong. Needs follow-up echo at 3 months.   Plan  Patient Instructions  Continue on Eliquis 66m Twice daily .  Continue on Prednisone 131mdaily.  Continue on Oxygen 3l/m at rest and 4l/m with activity, goal is to keep O2 sats >88-90%.  Saline nasal gel Twice daily  .  Activity as tolerated.  Albuterol inhaler /Neb as needed  Continue with Palliative care .  Avoid NSAIDS - advil, motrin, ibuprofen.  2 D echo as planned this fall.  Follow up with Dr. MaVaughan Brownerr Parrett NP in 2 months and As needed   Please contact office for sooner follow up if symptoms do not improve or worsen or seek emergency care          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[facility].  Unable to tolerate antifibrotic's.  Patient has high symptom burden. He is to continue on prednisone 10 mg daily.  On return visit could consider decreasing to 5 mg daily.  Continue on oxygen to maintain O2 saturations greater than 88 to 90%.  Activity as tolerated. Follow-up chest x-ray was done today.  This does show some increased markings along the left lower base.  Patient has no increased symptom burden, suspect this is chronic changes.  Hold on any antibiotics at this time.   Chronic respiratory failure with hypoxia (HCC) No increased oxygen mans.  Continue on O2 to maintain O2 saturation greater than 88 to 90%.         TaRexene EdisonNP 10/11/2020      Reason for call: patient called stating been having worsening dry coughing fits with more SOB, occ chest  tightness with no wheezing. Bad spell last night and throughout the day. O2 at 3L, maintaining stats in 90s with rest, 70s on exertion, rebounds quickly. Using Ventolin, Proventil nebs 3 times daily. Is seeing TP on Monday, will route to PM for any additional recs.  Also stated mouth has been really, PCP gave stuff that's not working  Dr. MaVaughan Brownerplease advise. Thanks!  (examples of things to ask: : When did symptoms start? Fever? Cough? Productive? Color to sputum? More sputum than usual? Wheezing? Have you needed increased oxygen? Are you taking your respiratory medications? What over the counter measures have you tried?)  Allergies  Allergen Reactions   Benadryl Allergy [Diphenhydramine Hcl]     Does not know what it does to him   Reglan [Metoclopramide] Other (See Comments)    Makes me crazy    Doxycycline Rash    Immunization History  Administered Date(s) Administered   Fluad Quad(high Dose 65+) 01/17/2019, 01/09/2020   Influenza, High Dose Seasonal PF 02/16/2017, 01/17/2019   Influenza,inj,Quad PF,6+ Mos 01/16/2018   Influenza-Unspecified 03/19/2016, 01/17/2019, 12/19/2019   Moderna Sars-Covid-2 Vaccination 05/15/2019, 06/13/2019, 02/02/2020   Pneumococcal Conjugate-13 07/08/2013   Pneumococcal Polysaccharide-23 11/05/2006   Tdap 07/03/2008, 11/22/2018   Zoster Recombinat (Shingrix) 11/22/2018, 02/12/2019

## 2020-12-08 NOTE — Telephone Encounter (Signed)
I called and spoke with patient regarding PM recs. Patient verbalized understanding and has enough pred on hand to make it until Monday with TP. Nothing further needed.

## 2020-12-08 NOTE — Telephone Encounter (Signed)
Please call in prednisone 40 mg a day until he can be seen in clinic as scheduled on 12/13/20

## 2020-12-13 ENCOUNTER — Other Ambulatory Visit: Payer: Self-pay

## 2020-12-13 ENCOUNTER — Ambulatory Visit: Payer: PPO | Admitting: Adult Health

## 2020-12-13 ENCOUNTER — Encounter: Payer: Self-pay | Admitting: Adult Health

## 2020-12-13 ENCOUNTER — Ambulatory Visit (INDEPENDENT_AMBULATORY_CARE_PROVIDER_SITE_OTHER): Payer: PPO

## 2020-12-13 VITALS — BP 128/80 | HR 89 | Temp 97.8°F | Ht 69.0 in | Wt 170.4 lb

## 2020-12-13 DIAGNOSIS — J9611 Chronic respiratory failure with hypoxia: Secondary | ICD-10-CM | POA: Diagnosis not present

## 2020-12-13 DIAGNOSIS — C9110 Chronic lymphocytic leukemia of B-cell type not having achieved remission: Secondary | ICD-10-CM

## 2020-12-13 DIAGNOSIS — K121 Other forms of stomatitis: Secondary | ICD-10-CM

## 2020-12-13 DIAGNOSIS — R0602 Shortness of breath: Secondary | ICD-10-CM | POA: Diagnosis not present

## 2020-12-13 DIAGNOSIS — I2699 Other pulmonary embolism without acute cor pulmonale: Secondary | ICD-10-CM

## 2020-12-13 DIAGNOSIS — J84112 Idiopathic pulmonary fibrosis: Secondary | ICD-10-CM | POA: Diagnosis not present

## 2020-12-13 MED ORDER — TRIAMCINOLONE ACETONIDE 0.1 % MT PSTE: PASTE | OROMUCOSAL | 1 refills | Status: AC

## 2020-12-13 MED ORDER — APIXABAN 2.5 MG PO TABS: 2.5000 mg | ORAL_TABLET | Freq: Two times a day (BID) | ORAL | 3 refills | Status: DC

## 2020-12-13 MED ORDER — SULFAMETHOXAZOLE-TRIMETHOPRIM 800-160 MG PO TABS
1.0000 | ORAL_TABLET | ORAL | 5 refills | Status: DC
Start: 2020-12-13 — End: 2021-02-02

## 2020-12-13 NOTE — Progress Notes (Signed)
_0  ID: Jeffrey Brennan, male    DOB: [birth year], 80 y.o.   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  Diagnosed with a pulmonary embolism September 01, 2020 on VQ scan was started on Eliquis. Unable to tolerate antifibrotic's-failed both Esbriet and Ofev. Medical history significant for CLL, diabetes, chronic kidney disease  TEST/EVENTS :  2D echo in late April showed preserved EF.  Decreased right ventricular systolic function, severely elevated pulmonary artery pressure at 63 mmHg.  Patient was set up for a VQ scan.  September 01, 2020 findings concerning for a small pulmonary embolism in the medial right middle lobe  12/13/2020 Follow up : IPF , O2 RF and PE  Patient returns for a 44-monthfollow-up.  Patient is accompanied by his son today.  Patient complains over the last couple months he continues to go downhill.  Has episodes where he gets very short of breath with decreased activity tolerance.  Patient was diagnosed with small PE in June 2022.  2D echo in April showed preserved EF and decreased right ventricular systolic function and severely elevated pulmonary artery pressure at 63 mmHg.  VQ scan done on September 01, 2020 showed concerning findings for small pulmonary embolism in the right middle lobe.  Patient was started on Eliquis.  Patient says he is doing well on Eliquis no known signs of bleeding.  Echo was repeated last week on December 07, 2020 that showed EF at 60 to 65%, mild LVH, right ventricular systolic function mildly improved and decreased pulmonary artery pressure at 48 mmHg.  Right ventricular size normal. He remains on oxygen 3 L at rest and 4 L with activity.  He has had no increased oxygen demands however his shortness of breath continues to get worse and he has decreased endurance and activity  tolerance.  He still lives at home.  Cannot walk any long distance but does try to go outside the home some.  Palliative care is following him at home.  Prednisone has been increased on several occasions over the last 2 months.  Most recently last week patient had increase significant shortness of breath and prednisone was increased to 40 mg daily.  Since last week patient says his breathing has improved slightly.  He has decreased down to 30 mg daily.  Long discussion with patient and his son regarding chronic steroid use and higher dose steroid use.  Patient does complain that he has had some significant irritation inside of his mouth over the last month.  He has been to his primary care provider and dentist tried several mouthwashes without any relief. Weight is stable.  No nausea vomiting or diarrhea.  Appetite is fair.     Allergies  Allergen Reactions   Benadryl Allergy [Diphenhydramine Hcl]     Does not know what it does to him   Reglan [Metoclopramide] Other (See Comments)    Makes me crazy    Doxycycline Rash    Immunization History  Administered Date(s) Administered   Fluad Quad(high Dose 65+) 01/17/2019, 01/09/2020   Influenza, High Dose Seasonal PF 02/16/2017, 01/17/2019   Influenza,inj,Quad PF,6+ Mos 01/16/2018   Influenza-Unspecified 03/19/2016, 01/17/2019, 12/19/2019   Moderna Sars-Covid-2 Vaccination 05/15/2019, 06/13/2019, 02/02/2020   Pneumococcal Conjugate-13 07/08/2013   Pneumococcal Polysaccharide-23 11/05/2006   Tdap 07/03/2008, 11/22/2018   Zoster Recombinat (Shingrix) 11/22/2018,  02/12/2019    Past Medical History:  Diagnosis Date   Chronic renal insufficiency    CLL (chronic lymphocytic leukemia) (HCC)    Diabetes (HCC)    Gout    Hypertension     Tobacco History: Social History   Tobacco Use  Smoking Status Former   Packs/day: 0.25   Years: 3.00   Pack years: 0.75   Types: Cigarettes, Cigars   Quit date: 1970   Years since quitting: 52.7   Smokeless Tobacco Never   Counseling given: Not Answered   Outpatient Medications Prior to Visit  Medication Sig Dispense Refill   albuterol (PROVENTIL) (2.5 MG/3ML) 0.083% nebulizer solution INHALE THE contents of ONE vial EVERY 6 HOURS AS NEEDED FOR wheezing OR SHORTNESS OF BREATH 90 mL 3   albuterol (VENTOLIN HFA) 108 (90 Base) MCG/ACT inhaler Inhale 2 puffs into the lungs every 6 (six) hours as needed for wheezing or shortness of breath. 1 each 2   allopurinol (ZYLOPRIM) 300 MG tablet Take 300 mg by mouth daily.     apixaban (ELIQUIS) 5 MG TABS tablet Take 1 tablet (5 mg total) by mouth 2 (two) times daily. 60 tablet 3   benzonatate (TESSALON) 200 MG capsule Take 1 capsule (200 mg total) by mouth 3 (three) times daily as needed for cough. 30 capsule 1   citalopram (CELEXA) 40 MG tablet Take 40 mg by mouth daily.     Crisaborole (EUCRISA) 2 % OINT Apply topically as needed.      fluticasone (FLONASE) 50 MCG/ACT nasal spray Place 2 sprays into both nostrils daily.     furosemide (LASIX) 20 MG tablet Take 1 tablet (20 mg total) by mouth daily. 30 tablet 1   glimepiride (AMARYL) 2 MG tablet Take 2 mg by mouth every morning.     halobetasol (ULTRAVATE) 0.05 % ointment SMARTSIG:2.5 Gram(s) Topical Twice Daily     loratadine (CLARITIN) 10 MG tablet Take 10 mg by mouth daily.     omeprazole (PRILOSEC) 20 MG capsule Take 20 mg by mouth. 1 capsule in the evening     omeprazole (PRILOSEC) 40 MG capsule Take 40 mg by mouth daily.      potassium chloride (KLOR-CON) 10 MEQ tablet Take 1 tablet (10 mEq total) by mouth daily. Take While taking Lasix/furosemide 30 tablet 2   pramipexole (MIRAPEX) 0.25 MG tablet Take 0.25 mg by mouth 2 (two) times daily.     predniSONE (DELTASONE) 10 MG tablet TAKE 1 TABLET BY MOUTH DAILY WITH BREAKFAST (Patient taking differently: Take 30 mg by mouth daily with breakfast.) 30 tablet 1   predniSONE (DELTASONE) 10 MG tablet 10-20 mg daily as directed (Patient not taking:  Reported on 12/13/2020) 60 tablet 1   azithromycin (ZITHROMAX) 250 MG tablet Take 1 tablet (250 mg total) by mouth as directed. (Patient not taking: Reported on 12/13/2020) 6 tablet 0   ELIQUIS 5 MG TABS tablet Take by mouth. (Patient not taking: Reported on 12/13/2020)     No facility-administered medications prior to visit.     Review of Systems:   Constitutional:   No  weight loss, night sweats,  Fevers, chills,  +fatigue, or  lassitude.  HEENT:   No headaches,  Difficulty swallowing,  Tooth/dental problems, or  Sore throat,                No sneezing, itching, ear ache, nasal congestion, post nasal drip,   CV:  No chest pain,  Orthopnea, PND, swelling in lower extremities, anasarca,  dizziness, palpitations, syncope.   GI  No heartburn, indigestion, abdominal pain, nausea, vomiting, diarrhea, change in bowel habits, loss of appetite, bloody stools.   Resp:  No chest wall deformity  Skin: no rash or lesions.  GU: no dysuria, change in color of urine, no urgency or frequency.  No flank pain, no hematuria   MS:  No joint pain or swelling.  No decreased range of motion.  No back pain.    Physical Exam    GEN: A/Ox3; pleasant , chronically ill-appearing in wheelchair on oxygen   HEENT:  [place]/AT,  NOSE-clear, THROAT-clear, no lesions, no postnasal drip or exudate noted.  Along oral mucosa several apathous ulcers are noted.  NECK:  Supple w/ fair ROM; no JVD; normal carotid impulses w/o bruits; no thyromegaly or nodules palpated; no lymphadenopathy.    RESP bibasilar crackles  no accessory muscle use, no dullness to percussion  CARD:  RRR, no m/r/g, no peripheral edema, pulses intact, no cyanosis or clubbing.  GI:   Soft & nt; nml bowel sounds; no organomegaly or masses detected.   Musco: Warm bil, no deformities or joint swelling noted.   Neuro: alert, no focal deficits noted.    Skin: Warm, no lesions or rashes    Lab Results:  CBC    Component Value Date/Time    WBC 19.7 (H) 06/14/2020 0527   RBC 3.81 (L) 06/14/2020 0527   HGB 11.0 (L) 06/14/2020 0527   HCT 34.2 (L) 06/14/2020 0527   PLT 242 06/14/2020 0527   MCV 89.8 06/14/2020 0527   MCH 28.9 06/14/2020 0527   MCHC 32.2 06/14/2020 0527   RDW 16.7 (H) 06/14/2020 0527   LYMPHSABS 13.4 (H) 06/11/2020 2104   MONOABS 0.8 06/11/2020 2104   EOSABS 0.9 (H) 06/11/2020 2104   BASOSABS 0.1 06/11/2020 2104    BMET    Component Value Date/Time   NA 139 06/14/2020 0527   K 3.7 06/14/2020 0527   CL 107 06/14/2020 0527   CO2 22 06/14/2020 0527   GLUCOSE 84 06/14/2020 0527   BUN 42 (H) 06/14/2020 0527   CREATININE 1.61 (H) 06/14/2020 0527   CALCIUM 8.9 06/14/2020 0527   GFRNONAA 44 (L) 06/14/2020 0527    BNP    Component Value Date/Time   BNP 38.0 06/11/2020 2009    ProBNP    Component Value Date/Time   PROBNP 248 03/10/2020 1111    Imaging: ECHOCARDIOGRAM COMPLETE  Result Date: 12/07/2020    ECHOCARDIOGRAM REPORT   Patient Name:   JADIS MIKA Date of Exam: 12/07/2020 Medical Rec #:  [id number]       Height:       69.0 in Accession #:    [id number]      Weight:       164.0 lb Date of Birth:  1941-01-19        BSA:          1.899 m Patient Age:    34 years        BP:           151/73 mmHg Patient Gender: M               HR:           83 bpm. Exam Location:  [facility] Procedure: 2D Echo, Cardiac Doppler and Color Doppler Indications:    J84.112 (ICD-10-CM) - IPF (idiopathic pulmonary fibrosis)  History:        Patient has prior history of Echocardiogram examinations,  most                 recent 07/29/2020. Risk Factors:Hypertension and Diabetes.                 Pulmonary embolism. Chronic respiratory failure with hypoxia. On                 home oxygen via nasal cannula.  Sonographer:    Alvino Chapel RCS Referring Phys: [place]  1. Left ventricular ejection fraction, by estimation, is 60 to 65%. The left ventricle has normal function. The left ventricle has no regional  wall motion abnormalities. There is mild left ventricular hypertrophy. Left ventricular diastolic parameters are indeterminate.  2. Compared to echo from APril 2022, RV is smaller and RVEF is mildly improved . Right ventricular systolic function is low normal. The right ventricular size is normal. There is moderately elevated pulmonary artery systolic pressure.  3. The mitral valve is normal in structure. Trivial mitral valve regurgitation.  4. The aortic valve is tricuspid. Aortic valve regurgitation is trivial. Mild aortic valve sclerosis is present, with no evidence of aortic valve stenosis.  5. The inferior vena cava is normal in size with <50% respiratory variability, suggesting right atrial pressure of 8 mmHg. FINDINGS  Left Ventricle: Left ventricular ejection fraction, by estimation, is 60 to 65%. The left ventricle has normal function. The left ventricle has no regional wall motion abnormalities. The left ventricular internal cavity size was normal in size. There is  mild left ventricular hypertrophy. Left ventricular diastolic parameters are indeterminate. Right Ventricle: Compared to echo from APril 2022, RV is smaller and RVEF is mildly improved. The right ventricular size is normal. Right vetricular wall thickness was not assessed. Right ventricular systolic function is low normal. There is moderately elevated pulmonary artery systolic pressure. The tricuspid regurgitant velocity is 3.17 m/s, and with an assumed right atrial pressure of 8 mmHg, the estimated right ventricular systolic pressure is 28.3 mmHg. Left Atrium: Left atrial size was normal in size. Right Atrium: Right atrial size was normal in size. Pericardium: Trivial pericardial effusion is present. Mitral Valve: The mitral valve is normal in structure. Trivial mitral valve regurgitation. Tricuspid Valve: The tricuspid valve is normal in structure. Tricuspid valve regurgitation is mild. Aortic Valve: The aortic valve is tricuspid. Aortic  valve regurgitation is trivial. Mild aortic valve sclerosis is present, with no evidence of aortic valve stenosis. Pulmonic Valve: The pulmonic valve was normal in structure. Pulmonic valve regurgitation is trivial. Aorta: The aortic root is normal in size and structure. Venous: The inferior vena cava is normal in size with less than 50% respiratory variability, suggesting right atrial pressure of 8 mmHg. IAS/Shunts: No atrial level shunt detected by color flow Doppler.  LEFT VENTRICLE PLAX 2D LVIDd:         4.00 cm  Diastology LVIDs:         2.40 cm  LV e' medial:    6.85 cm/s LV PW:         1.20 cm  LV E/e' medial:  10.6 LV IVS:        1.20 cm  LV e' lateral:   8.38 cm/s LVOT diam:     2.20 cm  LV E/e' lateral: 8.7 LV SV:         81 LV SV Index:   43 LVOT Area:     3.80 cm  RIGHT VENTRICLE RV S prime:     12.80 cm/s  TAPSE (M-mode): 2.7 cm LEFT ATRIUM             Index       RIGHT ATRIUM           Index LA diam:        3.50 cm 1.84 cm/m  RA Area:     17.40 cm LA Vol (A2C):   54.2 ml 28.54 ml/m RA Volume:   41.60 ml  21.91 ml/m LA Vol (A4C):   52.8 ml 27.81 ml/m LA Biplane Vol: 57.0 ml 30.02 ml/m  AORTIC VALVE LVOT Vmax:   124.00 cm/s LVOT Vmean:  82.500 cm/s LVOT VTI:    0.214 m  AORTA Ao Root diam: 3.80 cm MITRAL VALVE               TRICUSPID VALVE MV Area (PHT): 2.85 cm    TR Peak grad:   40.2 mmHg MV Decel Time: 266 msec    TR Vmax:        317.00 cm/s MV E velocity: 72.70 cm/s MV A velocity: 75.60 cm/s  SHUNTS MV E/A ratio:  0.96        Systemic VTI:  0.21 m                            Systemic Diam: 2.20 cm Dorris Carnes MD Electronically signed by Dorris Carnes MD Signature Date/Time: 12/07/2020/5:28:27 PM    Final     lidocaine (XYLOCAINE) 1 % (with pres) injection 3 mL     Date Action Dose Route User   10/20/2020 1104 Given 3 mL Other Mcarthur Rossetti, MD      methylPREDNISolone acetate (DEPO-MEDROL) injection 40 mg     Date Action Dose Route User   10/20/2020 1104 Given 40 mg Intra-articular  Mcarthur Rossetti, MD       PFT Results Latest Ref Rng & Units 05/18/2020 05/29/2019  FVC-Pre L 2.42 2.62  FVC-Predicted Pre % 63 68  FVC-Post L 2.34 2.63  FVC-Predicted Post % 61 68  Pre FEV1/FVC % % 90 90  Post FEV1/FCV % % 93 92  FEV1-Pre L 2.19 2.37  FEV1-Predicted Pre % 81 86  FEV1-Post L 2.17 2.42  DLCO uncorrected ml/min/mmHg 10.94 13.80  DLCO UNC% % 47 59  DLCO corrected ml/min/mmHg 10.94 13.80  DLCO COR %Predicted % 47 59  DLVA Predicted % 81 85  TLC L 3.59 4.06  TLC % Predicted % 54 61  RV % Predicted % 21 51    No results found for: NITRICOXIDE      Assessment & Plan:   No problem-specific Assessment & Plan notes found for this encounter.     Rexene Edison, NP 12/13/2020

## 2020-12-13 NOTE — Patient Instructions (Addendum)
Decrease Eliquis 2.53m Twice daily .  Continue on Prednisone 331mfor 1 week then 2591maily for 1 week and 53m22mily and hold .  Begin Bactrim DS three days a week.  Will call with chest xray results and dental paste .  Continue on Oxygen 3l/m at rest and 4l/m with activity, goal is to keep O2 sats >88-90%.  Saline nasal gel Twice daily  .  Activity as tolerated.  Albuterol inhaler /Neb as needed  Continue with Palliative care .  Avoid NSAIDS - advil, motrin, ibuprofen.  Follow up with Dr. MannVaughan BrownerParrett NP in 4 weeks and As needed   Please contact office for sooner follow up if symptoms do not improve or worsen or seek emergency care

## 2020-12-14 ENCOUNTER — Other Ambulatory Visit: Payer: Self-pay | Admitting: Adult Health

## 2020-12-14 ENCOUNTER — Other Ambulatory Visit (HOSPITAL_COMMUNITY)
Admission: RE | Admit: 2020-12-14 | Discharge: 2020-12-14 | Disposition: A | Discharge status: Home / Self Care | Payer: PPO | Source: Ambulatory Visit | Attending: Adult Health | Admitting: Adult Health

## 2020-12-14 DIAGNOSIS — J84112 Idiopathic pulmonary fibrosis: Secondary | ICD-10-CM | POA: Diagnosis not present

## 2020-12-14 DIAGNOSIS — K121 Other forms of stomatitis: Secondary | ICD-10-CM | POA: Insufficient documentation

## 2020-12-14 LAB — CBC WITH DIFFERENTIAL/PLATELET
Abs Immature Granulocytes: 0.2 10*3/uL — ABNORMAL HIGH (ref 0.00–0.07)
Basophils Absolute: 0.1 10*3/uL (ref 0.0–0.1)
Basophils Relative: 0 %
Eosinophils Absolute: 0.2 10*3/uL (ref 0.0–0.5)
Eosinophils Relative: 1 %
HCT: 39.8 % (ref 39.0–52.0)
Hemoglobin: 13 g/dL (ref 13.0–17.0)
Immature Granulocytes: 1 %
Lymphocytes Relative: 63 %
Lymphs Abs: 23.2 10*3/uL — ABNORMAL HIGH (ref 0.7–4.0)
MCH: 30.4 pg (ref 26.0–34.0)
MCHC: 32.7 g/dL (ref 30.0–36.0)
MCV: 93.2 fL (ref 80.0–100.0)
Monocytes Absolute: 0.6 10*3/uL (ref 0.1–1.0)
Monocytes Relative: 2 %
Neutro Abs: 12 10*3/uL — ABNORMAL HIGH (ref 1.7–7.7)
Neutrophils Relative %: 33 %
Platelets: 205 10*3/uL (ref 150–400)
RBC: 4.27 MIL/uL (ref 4.22–5.81)
RDW: 16.7 % — ABNORMAL HIGH (ref 11.5–15.5)
WBC: 36.1 10*3/uL — ABNORMAL HIGH (ref 4.0–10.5)
nRBC: 0 % (ref 0.0–0.2)

## 2020-12-14 NOTE — Assessment & Plan Note (Signed)
Several scattered apathous ulcers We will try triamcinolone dental paste as needed

## 2020-12-14 NOTE — Assessment & Plan Note (Addendum)
Progressive IPF-patient continues to have severe symptom burden. Continue with palliative care at home.  May need to consider transition to hospice  Patient is steroid responsive.  Has been on multiple steroid burst.  Currently on 30 mg.  We will add in Bactrim DS 3 times a week for PJP prophylaxis. We will slowly wean prednisone down would like to get down to 10 mg daily if possible we will need to do this very slowly over the next several weeks to months. Check CBC, chest x-ray.  Plan  Patient Instructions  Decrease Eliquis 2.44m Twice daily .  Continue on Prednisone 376mfor 1 week then 2516maily for 1 week and 70m23mily and hold .  Begin Bactrim DS three days a week.  Will call with chest xray results and dental paste .  Continue on Oxygen 3l/m at rest and 4l/m with activity, goal is to keep O2 sats >88-90%.  Saline nasal gel Twice daily  .  Activity as tolerated.  Albuterol inhaler /Neb as needed  Continue with Palliative care .  Avoid NSAIDS - advil, motrin, ibuprofen.  Follow up with Dr. MannVaughan BrownerParrett NP in 4 weeks and As needed   Please contact office for sooner follow up if symptoms do not improve or worsen or seek emergency care

## 2020-12-14 NOTE — Assessment & Plan Note (Signed)
Continue follow-up with oncology

## 2020-12-14 NOTE — Assessment & Plan Note (Signed)
Small PE on VQ scan June 2022.  Unprovoked.  Patient was placed on Eliquis.  He has been on this for 3 months. We discussed decreasing the dose of Eliquis, with patient's age and chronic kidney disease.  Patient education on anticoagulation therapy.  Patient is aware to avoid all nonsteroidal products. There is concern of possible cost issues.  Patient will contact our office if unable to afford.  May need to look for patient assistance and or alternative.  Plan  Patient Instructions  Decrease Eliquis 2.41m Twice daily .  Continue on Prednisone 363mfor 1 week then 2522maily for 1 week and 9m97mily and hold .  Begin Bactrim DS three days a week.  Will call with chest xray results and dental paste .  Continue on Oxygen 3l/m at rest and 4l/m with activity, goal is to keep O2 sats >88-90%.  Saline nasal gel Twice daily  .  Activity as tolerated.  Albuterol inhaler /Neb as needed  Continue with Palliative care .  Avoid NSAIDS - advil, motrin, ibuprofen.  Follow up with Dr. MannVaughan BrownerParrett NP in 4 weeks and As needed   Please contact office for sooner follow up if symptoms do not improve or worsen or seek emergency care

## 2020-12-14 NOTE — Assessment & Plan Note (Signed)
No increased oxygen mans continue on 3 L at rest 4 L with activity.

## 2020-12-15 DIAGNOSIS — N189 Chronic kidney disease, unspecified: Secondary | ICD-10-CM | POA: Diagnosis not present

## 2020-12-15 DIAGNOSIS — E7849 Other hyperlipidemia: Secondary | ICD-10-CM | POA: Diagnosis not present

## 2020-12-15 DIAGNOSIS — Z6824 Body mass index (BMI) 24.0-24.9, adult: Secondary | ICD-10-CM | POA: Diagnosis not present

## 2020-12-15 DIAGNOSIS — I10 Essential (primary) hypertension: Secondary | ICD-10-CM | POA: Diagnosis not present

## 2020-12-15 DIAGNOSIS — K219 Gastro-esophageal reflux disease without esophagitis: Secondary | ICD-10-CM | POA: Diagnosis not present

## 2020-12-15 DIAGNOSIS — G2581 Restless legs syndrome: Secondary | ICD-10-CM | POA: Diagnosis not present

## 2020-12-15 DIAGNOSIS — E119 Type 2 diabetes mellitus without complications: Secondary | ICD-10-CM | POA: Diagnosis not present

## 2020-12-15 DIAGNOSIS — C9191 Lymphoid leukemia, unspecified, in remission: Secondary | ICD-10-CM | POA: Diagnosis not present

## 2020-12-15 NOTE — Progress Notes (Signed)
Called and spoke with patient, provided results/recommendations per Rexene Edison NP.  He verbalized understanding.  Nothing further needed.

## 2020-12-17 ENCOUNTER — Other Ambulatory Visit: Payer: Self-pay | Admitting: Adult Health

## 2020-12-23 ENCOUNTER — Other Ambulatory Visit: Payer: Self-pay | Admitting: Adult Health

## 2020-12-23 DIAGNOSIS — K120 Recurrent oral aphthae: Secondary | ICD-10-CM | POA: Diagnosis not present

## 2020-12-23 DIAGNOSIS — B370 Candidal stomatitis: Secondary | ICD-10-CM | POA: Diagnosis not present

## 2020-12-23 DIAGNOSIS — Z6824 Body mass index (BMI) 24.0-24.9, adult: Secondary | ICD-10-CM | POA: Diagnosis not present

## 2020-12-30 ENCOUNTER — Emergency Department (HOSPITAL_COMMUNITY)
Admission: EM | Admit: 2020-12-30 | Discharge: 2020-12-30 | Disposition: A | Discharge status: Home / Self Care | Payer: PPO | Attending: Emergency Medicine | Admitting: Emergency Medicine

## 2020-12-30 ENCOUNTER — Other Ambulatory Visit: Payer: Self-pay

## 2020-12-30 ENCOUNTER — Encounter (HOSPITAL_COMMUNITY): Payer: Self-pay | Admitting: Emergency Medicine

## 2020-12-30 DIAGNOSIS — Z856 Personal history of leukemia: Secondary | ICD-10-CM | POA: Diagnosis not present

## 2020-12-30 DIAGNOSIS — R040 Epistaxis: Secondary | ICD-10-CM | POA: Insufficient documentation

## 2020-12-30 DIAGNOSIS — Z79899 Other long term (current) drug therapy: Secondary | ICD-10-CM | POA: Diagnosis not present

## 2020-12-30 DIAGNOSIS — Z96641 Presence of right artificial hip joint: Secondary | ICD-10-CM | POA: Insufficient documentation

## 2020-12-30 DIAGNOSIS — I129 Hypertensive chronic kidney disease with stage 1 through stage 4 chronic kidney disease, or unspecified chronic kidney disease: Secondary | ICD-10-CM | POA: Diagnosis not present

## 2020-12-30 DIAGNOSIS — N183 Chronic kidney disease, stage 3 unspecified: Secondary | ICD-10-CM | POA: Insufficient documentation

## 2020-12-30 DIAGNOSIS — Z87891 Personal history of nicotine dependence: Secondary | ICD-10-CM | POA: Insufficient documentation

## 2020-12-30 DIAGNOSIS — E1122 Type 2 diabetes mellitus with diabetic chronic kidney disease: Secondary | ICD-10-CM | POA: Insufficient documentation

## 2020-12-30 DIAGNOSIS — Z86010 Personal history of colonic polyps: Secondary | ICD-10-CM | POA: Insufficient documentation

## 2020-12-30 DIAGNOSIS — Z7984 Long term (current) use of oral hypoglycemic drugs: Secondary | ICD-10-CM | POA: Insufficient documentation

## 2020-12-30 DIAGNOSIS — Z7901 Long term (current) use of anticoagulants: Secondary | ICD-10-CM | POA: Diagnosis not present

## 2020-12-30 MED ORDER — TRIPLE ANTIBIOTIC 3.5-400-5000 EX OINT: 1.0000 "application " | TOPICAL_OINTMENT | Freq: Once | CUTANEOUS | Status: DC | PRN

## 2020-12-30 MED ORDER — SILVER NITRATE-POT NITRATE 75-25 % EX MISC
1.0000 | Freq: Once | CUTANEOUS | Status: AC | PRN
  Administered 2020-12-30: 1 via TOPICAL
  Filled 2020-12-30: qty 10

## 2020-12-30 MED ORDER — COCAINE HCL 4 % EX SOLN: 4.0000 mL | Freq: Once | CUTANEOUS | Status: DC

## 2020-12-30 MED ORDER — OXYMETAZOLINE HCL 0.05 % NA SOLN
2.0000 | Freq: Once | NASAL | Status: AC
  Administered 2020-12-30: 2 via NASAL
  Filled 2020-12-30: qty 30

## 2020-12-30 MED ORDER — COCAINE HCL 40 MG/ML NA SOLN
4.0000 mL | Freq: Once | NASAL | Status: AC
  Administered 2020-12-30: 4 mL via NASAL

## 2020-12-30 MED ORDER — OXYMETAZOLINE HCL 0.05 % NA SOLN
1.0000 | Freq: Once | NASAL | Status: DC | PRN
  Filled 2020-12-30: qty 30

## 2020-12-30 MED ORDER — LIDOCAINE HCL 2 % EX GEL
1.0000 "application " | Freq: Once | CUTANEOUS | Status: DC | PRN
  Filled 2020-12-30: qty 4250
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
## 2020-12-30 NOTE — ED Triage Notes (Signed)
Pt c/o left sided nose bleed for the past hour. Pt takes plavix at home.

## 2020-12-30 NOTE — Discharge Instructions (Signed)
It was our pleasure to provide your ER care today - we hope that you feel better.  Avoid rubbing nose, blowing nose, or sticking anything into nose tonight. If bleeding recurs, hold direct pressure/pinch nose, without letting up, for 10 minutes - if bleeding persists, and/or for heavy bleeding - return to ER.   Consider discussing humidified O2 with your doctor. Use saline nasal drops and humidifier in the room you sleep.   If recurrent/frequent nosebleeds, follow up with ENT doctor.

## 2021-01-01 ENCOUNTER — Other Ambulatory Visit: Payer: Self-pay | Admitting: Adult Health

## 2021-01-05 DIAGNOSIS — B379 Candidiasis, unspecified: Secondary | ICD-10-CM | POA: Diagnosis not present

## 2021-01-05 DIAGNOSIS — Z515 Encounter for palliative care: Secondary | ICD-10-CM | POA: Diagnosis not present

## 2021-01-05 DIAGNOSIS — J8410 Pulmonary fibrosis, unspecified: Secondary | ICD-10-CM | POA: Diagnosis not present

## 2021-01-05 DIAGNOSIS — I749 Embolism and thrombosis of unspecified artery: Secondary | ICD-10-CM | POA: Diagnosis not present

## 2021-01-12 ENCOUNTER — Other Ambulatory Visit: Payer: Self-pay

## 2021-01-12 ENCOUNTER — Ambulatory Visit: Payer: PPO | Admitting: Pulmonary Disease

## 2021-01-12 ENCOUNTER — Encounter: Payer: Self-pay | Admitting: Pulmonary Disease

## 2021-01-12 VITALS — BP 118/68 | HR 104 | Temp 97.6°F | Ht 69.0 in | Wt 167.8 lb

## 2021-01-12 DIAGNOSIS — I2699 Other pulmonary embolism without acute cor pulmonale: Secondary | ICD-10-CM

## 2021-01-12 DIAGNOSIS — J84112 Idiopathic pulmonary fibrosis: Secondary | ICD-10-CM | POA: Diagnosis not present

## 2021-01-12 DIAGNOSIS — R0600 Dyspnea, unspecified: Secondary | ICD-10-CM | POA: Diagnosis not present

## 2021-01-12 NOTE — Patient Instructions (Addendum)
We will stop the Eliquis as you are having nosebleeds Continue on supplemental oxygen I will give you some literature on right heart catheterization to review Follow-up in 6 months

## 2021-01-12 NOTE — Progress Notes (Signed)
Jeffrey Brennan    [id number]    1940/06/23  Primary Care Physician:[name], [name], PA  Referring Physician: [name], PA [place],  [place] [address]  Problem list: Follow-up for IPF Started Ofev December 2020 but was poorly tolerated. Switch to Esbriet in October 2021 but was poorly tolerated.  Stopped Esbriet in January 1076  HPI: 80 year old with history of CLL, chronic renal insufficiency, diabetes, hypertension Previously followed by Dr. Luan Pulling, pulmonologist at [facility] who is recently retired Noted to have pulmonary fibrosis on CT scan in 2020 has was started on antifibrotics.  Has not tolerated Ofev or Esbriet.  All anti fibrotics were stopped in January 2022. Assessed for clinical trials but not a candidate due to diagnosis of CLL  He had a hospitalization in March 2022 for pneumonia after failing azithromycin as an outpatient. Treated with IV ceftriaxone, azithromycin and Decadron.  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  No mold, hot tub, Jacuzzi.  No down pillows or comforter Smoking history: 5-pack-year smoker.  Quit in the 1970s Travel history: No significant travel history Relevant family history: His younger sister had unspecified pulmonary fibrosis.  She died of complications of cirrhosis and alcohol use  Interim history:  He was evaluated for worsening dyspnea with VQ scan on 09/01/2020 with small segmental defect.  He had been started on Eliquis which was reduced to low-dose in September  Even on low-dose of Eliquis he had episode of significant epistaxis requiring ED visit on 12/30/2020  Outpatient Encounter Medications as of 01/12/2021  Medication Sig   albuterol  (PROVENTIL) (2.5 MG/3ML) 0.083% nebulizer solution INHALE THE contents of ONE vial EVERY 6 HOURS AS NEEDED FOR wheezing OR SHORTNESS OF BREATH   albuterol (VENTOLIN HFA) 108 (90 Base) MCG/ACT inhaler INHALE TWO PUFFS EVERY 6 HOURS AS NEEDED FOR wheezing OR SHORTNESS OF BREATH   allopurinol (ZYLOPRIM) 300 MG tablet Take 300 mg by mouth daily.   apixaban (ELIQUIS) 2.5 MG TABS tablet Take 1 tablet (2.5 mg total) by mouth 2 (two) times daily.   citalopram (CELEXA) 40 MG tablet Take 40 mg by mouth daily.   Crisaborole (EUCRISA) 2 % OINT Apply topically as needed.    fluconazole (DIFLUCAN) 100 MG tablet Take 100 mg by mouth daily.   fluticasone (FLONASE) 50 MCG/ACT nasal spray Place 2 sprays into both nostrils daily.   furosemide (LASIX) 20 MG tablet Take 1 tablet (20 mg total) by mouth daily.   glimepiride (AMARYL) 2 MG tablet Take 2 mg by mouth every morning.   halobetasol (ULTRAVATE) 0.05 % ointment SMARTSIG:2.5 Gram(s) Topical Twice Daily   loratadine (CLARITIN) 10 MG tablet Take 10 mg by mouth daily.   nystatin (MYCOSTATIN) 100000 UNIT/ML suspension Take 100,000 Units by mouth.   omeprazole (PRILOSEC) 20 MG capsule Take 20 mg by mouth. 1 capsule in the evening   omeprazole (PRILOSEC) 40 MG capsule Take 40 mg by mouth daily.    potassium chloride (KLOR-CON) 10 MEQ tablet Take 1 tablet (10 mEq total) by mouth daily. Take While taking Lasix/furosemide   pramipexole (MIRAPEX) 0.25 MG tablet Take 0.25 mg by mouth 2 (two) times daily.   predniSONE (DELTASONE) 10 MG tablet TAKE 1 TABLET BY MOUTH DAILY  WITH BREAKFAST (Patient taking differently: Take 20 mg by mouth daily with breakfast.)   predniSONE (DELTASONE) 10 MG tablet 10-20 mg daily as directed   sulfamethoxazole-trimethoprim (BACTRIM DS) 800-160 MG tablet Take 1 tablet by mouth 3 (three) times a week.   triamcinolone (KENALOG) 0.1 % paste Apply dental paste to mouth ulcer twice daily as needed   benzonatate (TESSALON) 200 MG capsule Take 1  capsule (200 mg total) by mouth 3 (three) times daily as needed for cough. (Patient not taking: Reported on 01/12/2021)   No facility-administered encounter medications on file as of 01/12/2021.   Physical Exam: Blood pressure 118/68, pulse (!) 104, temperature 97.6 F (36.4 C), height 5' 9" (1.753 m), weight 167 lb 12.8 oz (76.1 kg), SpO2 94 %. Gen:      No acute distress HEENT:  EOMI, sclera anicteric Neck:     No masses; no thyromegaly Lungs:    Clear to auscultation bilaterally; normal respiratory effort CV:         Regular rate and rhythm; no murmurs Abd:      + bowel sounds; soft, non-tender; no palpable masses, no distension Ext:    No edema; adequate peripheral perfusion Skin:      Warm and dry; no rash Neuro: alert and oriented x 3 Psych: normal mood and affect   Data Reviewed: Imaging: CT abdomen pelvis 06/23/2014-mild scarring at the base.  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  Mild lymphadenopathy, 4.1 cm ascending thoracic erythema.  High-res CT 05/13/2020-UIP pattern pulmonary fibrosis, reactive mediastinal lymphadenopathy, thoracic aortic aneurysm, enlarged PA  Chest x-ray 12/13/2020-stable severe interstitial lung disease I have reviewed the images personally.  PFTs 05/29/2019 FVC 2.63 [68%], FEV1 2.42 [88%], F/F 92, TLC 4.06 [61%], DLCO 13.80 [59%] Moderate-severe restriction and diffusion defect.  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 Significantly progressed since 2016.  He does not have any signs and symptoms of connective tissue disease or exposures.  Has borderline ANA which is nonspecific.  Exposure history is is not significant as he had minimal work with metal grinding which is more of a hobby  He has not tolerated Ofev and Esbriet Pulmonary rehab is limited by mobility issues due to hip bursitis We discussed lung transplant but he is not interested in  this.  Pulmonary embolism VQ scan with small pulmonary embolism He has received 3 months of anticoagulation however developed epistaxis. We discussed options today and decided to hold anticoagulation.  He is aware of the increased risk of recurrent clot  Pulmonary hypertension Echo as noted with pulmonary hypertension which precedes his diagnosis of pulmonary embolism.  He may be a candidate for tyvaso also but will need a right heart cath Discussed this procedure with him today and he would like to get no information before making a decision I have given him a patient handout  Goals of care Jillian wants to maintain his independence and ability to live alone Discussed DNR status but he is not ready to make that decision yet He has been referred to palliative care  Plan/Recommendations: - Stop Eliquis - Continue supplemental oxygen - Consider right heart catheterization  Marshell Garfinkel MD [facility] 01/12/2021, 4:09 PM  CC: [name], PA

## 2021-01-15 ENCOUNTER — Other Ambulatory Visit: Payer: Self-pay | Admitting: Adult Health

## 2021-01-20 DIAGNOSIS — L89159 Pressure ulcer of sacral region, unspecified stage: Secondary | ICD-10-CM | POA: Diagnosis not present

## 2021-01-20 DIAGNOSIS — Z6823 Body mass index (BMI) 23.0-23.9, adult: Secondary | ICD-10-CM | POA: Diagnosis not present

## 2021-01-24 ENCOUNTER — Telehealth: Payer: Self-pay | Admitting: Adult Health

## 2021-01-24 ENCOUNTER — Other Ambulatory Visit: Payer: Self-pay | Admitting: Adult Health

## 2021-01-24 NOTE — Telephone Encounter (Signed)
Spoke with pt who stated that lower legs and feet are swollen and have been swelling over the last 2 weeks. Pt also states that SOB has increased over last couple of days. Pt sats are 96% on 4L right now. Pt states he takes 40 mg of Lasix daily which was just recently upped from 20 mg daily. Pt states concern about the possibility of fluid around his lungs. Pt instructed if symptoms get worse he would need to go and be evaluated at an UC or ED. Dr. Vaughan Browner please advise.

## 2021-01-24 NOTE — Telephone Encounter (Signed)
Spoke with pt and reviewed Lasix increase. Pt stated understanding. Pt was scheduled in OV with Geraldo Pitter because pt could not come in until Wednesday afternoon. Pt strongly encouraged to seek emergent medical evaluation. Pt verified he had enough lasix for increase. Nothing further needed at this time.

## 2021-01-24 NOTE — Telephone Encounter (Signed)
Please increase lasix dose to 40 mg twice daily for 3 days Get CXR and likely needs an acute visit for labs such as metabolic panel, pro bnp  If worse then agree with ED visit.

## 2021-01-25 ENCOUNTER — Other Ambulatory Visit: Payer: Self-pay

## 2021-01-25 ENCOUNTER — Ambulatory Visit: Payer: PPO

## 2021-01-25 DIAGNOSIS — R0600 Dyspnea, unspecified: Secondary | ICD-10-CM

## 2021-01-25 DIAGNOSIS — R070 Pain in throat: Secondary | ICD-10-CM | POA: Diagnosis not present

## 2021-01-25 DIAGNOSIS — K120 Recurrent oral aphthae: Secondary | ICD-10-CM | POA: Diagnosis not present

## 2021-01-25 NOTE — Progress Notes (Signed)
Error

## 2021-01-26 ENCOUNTER — Other Ambulatory Visit: Payer: Self-pay | Admitting: Primary Care

## 2021-01-26 ENCOUNTER — Ambulatory Visit (INDEPENDENT_AMBULATORY_CARE_PROVIDER_SITE_OTHER): Payer: PPO | Admitting: Primary Care

## 2021-01-26 ENCOUNTER — Encounter: Payer: Self-pay | Admitting: Primary Care

## 2021-01-26 ENCOUNTER — Other Ambulatory Visit: Payer: Self-pay

## 2021-01-26 ENCOUNTER — Ambulatory Visit (INDEPENDENT_AMBULATORY_CARE_PROVIDER_SITE_OTHER): Payer: PPO

## 2021-01-26 VITALS — BP 130/76 | HR 107 | Temp 97.7°F | Ht 69.0 in | Wt 166.0 lb

## 2021-01-26 DIAGNOSIS — I2699 Other pulmonary embolism without acute cor pulmonale: Secondary | ICD-10-CM

## 2021-01-26 DIAGNOSIS — J84112 Idiopathic pulmonary fibrosis: Secondary | ICD-10-CM

## 2021-01-26 DIAGNOSIS — R600 Localized edema: Secondary | ICD-10-CM

## 2021-01-26 DIAGNOSIS — R0602 Shortness of breath: Secondary | ICD-10-CM

## 2021-01-26 DIAGNOSIS — R0600 Dyspnea, unspecified: Secondary | ICD-10-CM | POA: Diagnosis not present

## 2021-01-26 LAB — BASIC METABOLIC PANEL
BUN: 37 mg/dL — ABNORMAL HIGH (ref 6–23)
CO2: 25 mEq/L (ref 19–32)
Calcium: 8.8 mg/dL (ref 8.4–10.5)
Chloride: 101 mEq/L (ref 96–112)
Creatinine, Ser: 2 mg/dL — ABNORMAL HIGH (ref 0.40–1.50)
GFR: 31.15 mL/min — ABNORMAL LOW (ref >60.00)
Glucose, Bld: 286 mg/dL — ABNORMAL HIGH (ref 70–99)
Potassium: 4.5 mEq/L (ref 3.5–5.1)
Sodium: 134 mEq/L — ABNORMAL LOW (ref 135–145)

## 2021-01-26 LAB — D-DIMER, QUANTITATIVE: D-Dimer, Quant: 1.65 mcg/mL FEU — ABNORMAL HIGH (ref <0.50)

## 2021-01-26 MED ORDER — FLUCONAZOLE 150 MG PO TABS: 150.0000 mg | ORAL_TABLET | Freq: Every day | ORAL | 0 refills | Status: AC

## 2021-01-26 NOTE — Patient Instructions (Addendum)
Chronic interstitial lung disease with some progressive worsening since June of 2022. This may reflect worsening of interstitial disease, component of superimposed infection or edema could also cause this appearance.  We may get CT chest in the next day or so depending on lab results   Recommendations: - Use albuterol inhaler (with spacer) versus nebulizer  - Continue Lasix 62m x 3 days total (Tuesday, Wednesday, Thursday); then resume 428mdaily unless told otherwise - Stay on prednisone 2048maily until I get Dr. TeoBenjamine Molate and review - I will send in RX for fluconazole 150m31m1 for thrush/oral ulcer  Orders: - Labs today (BMET/ BNP/D-dimer) - CXR (already done)  Follow-up: - As needed if symptoms do not improve

## 2021-01-26 NOTE — Progress Notes (Addendum)
_0  ID: Jeffrey Brennan, male    DOB: 1941/03/28, 80 y.o.   MRN: [id number]  No chief complaint on file.   Referring provider: [name], PA   Problem list: Follow-up for IPF Started Ofev December 2020 but was poorly tolerated. Switch to Esbriet in October 2021 but was poorly tolerated.  Stopped Esbriet in January 6030  HPI: 80 year old with history of CLL, chronic renal insufficiency, diabetes, hypertension Previously followed by Dr. Luan Pulling, pulmonologist at [facility] who is recently retired Noted to have pulmonary fibrosis on CT scan in 2020 has was started on antifibrotics.  Has not tolerated Ofev or Esbriet.  All anti fibrotics were stopped in January 2022. Assessed for clinical trials but not a candidate due to diagnosis of CLL  He had a hospitalization in March 2022 for pneumonia after failing azithromycin as an outpatient. Treated with IV ceftriaxone, azithromycin and Decadron.  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  No mold, hot tub, Jacuzzi.  No down pillows or comforter Smoking history: 5-pack-year smoker.  Quit in the 1970s Travel history: No significant travel history Relevant family history: His younger sister had unspecified pulmonary fibrosis.  She died of complications of cirrhosis and alcohol use  01/12/21 He was evaluated for worsening dyspnea with VQ scan on 09/01/2020 with small segmental defect.  He had been started on Eliquis which was reduced to low-dose in September  Even on low-dose of Eliquis he had episode of significant epistaxis requiring ED visit on 12/30/2020   01/26/2021- Interim hx  Patient presents today for acute visit. Patient called our office on 10/24 with reports of  lower leg swelling with increased shortness of breath. O2 levels have been around 96% on 3L. He is lasix 75m daily. Dr. MVaughan Browneradvised he increase lasix 442mBID x 3 days and come in for CXR with BMET/BNP lab work. He has not tolerated ofev and Esbriet. Prednisone was tapered from 3030mo 60m24m September. Pulmonary rehab is limited d/t mobility issues. He is not interested in lung transplant. Eliquis was stopped on 01/12/21 d.t epistaxis. He has hx small pulmonary embolism, completed 3 months of anticoagulation.     Allergies  Allergen Reactions   Benadryl Allergy [Diphenhydramine Hcl]     Does not know what it does to him   Reglan [Metoclopramide] Other (See Comments)    Makes me crazy    Doxycycline Rash    Immunization History  Administered Date(s) Administered   Fluad Quad(high Dose 65+) 01/17/2019, 01/09/2020   Influenza, High Dose Seasonal PF 02/16/2017, 01/17/2019   Influenza,inj,Quad PF,6+ Mos 01/16/2018   Influenza-Unspecified 03/19/2016, 01/17/2019, 12/19/2019   Moderna Sars-Covid-2 Vaccination 05/15/2019, 06/13/2019, 02/02/2020   Pneumococcal Conjugate-13 07/08/2013   Pneumococcal Polysaccharide-23 11/05/2006   Tdap 07/03/2008, 11/22/2018   Zoster Recombinat (Shingrix) 11/22/2018, 02/12/2019    Past Medical History:  Diagnosis Date   Chronic renal insufficiency    CLL (chronic lymphocytic leukemia) (HCC)[place] Diabetes (HCC)[place] Gout    Hypertension     Tobacco History: Social History   Tobacco Use  Smoking Status Former   Packs/day: 0.25   Years: 3.00   Pack years: 0.75   Types: Cigarettes, Cigars   Quit date: 1970   Years since quitting: 52.841  Smokeless Tobacco Never   Counseling given: Not Answered   Outpatient Medications Prior to Visit  Medication Sig Dispense Refill   albuterol (PROVENTIL) (2.5 MG/3ML) 0.083% nebulizer solution INHALE THE contents of ONE vial EVERY 6 HOURS AS NEEDED FOR wheezing OR SHORTNESS OF BREATH 90 mL 3   albuterol  (VENTOLIN HFA) 108 (90 Base) MCG/ACT inhaler INHALE TWO PUFFS EVERY 6 HOURS AS NEEDED FOR wheezing OR SHORTNESS OF BREATH 8.5 g 11   allopurinol (ZYLOPRIM) 300 MG tablet Take 300 mg by mouth daily.     citalopram (CELEXA) 40 MG tablet Take 40 mg by mouth daily.     Crisaborole (EUCRISA) 2 % OINT Apply topically as needed.      fluticasone (FLONASE) 50 MCG/ACT nasal spray Place 2 sprays into both nostrils daily.     furosemide (LASIX) 20 MG tablet Take 1 tablet (20 mg total) by mouth daily. 30 tablet 1   glimepiride (AMARYL) 2 MG tablet Take 2 mg by mouth every morning.     halobetasol (ULTRAVATE) 0.05 % ointment SMARTSIG:2.5 Gram(s) Topical Twice Daily     loratadine (CLARITIN) 10 MG tablet Take 10 mg by mouth daily.     omeprazole (PRILOSEC) 20 MG capsule Take 20 mg by mouth. 1 capsule in the evening     omeprazole (PRILOSEC) 40 MG capsule Take 40 mg by mouth daily.      potassium chloride (KLOR-CON) 10 MEQ tablet Take 1 tablet (10 mEq total) by mouth daily. Take While taking Lasix/furosemide 30 tablet 2   pramipexole (MIRAPEX) 0.25 MG tablet Take 0.25 mg by mouth 2 (two) times daily.     predniSONE (DELTASONE) 10 MG tablet TAKE 1 TABLET BY MOUTH DAILY WITH BREAKFAST (Patient taking differently: Take 20 mg by mouth daily with breakfast.) 30 tablet 1   predniSONE (DELTASONE) 10 MG tablet TAKE 1 OR 2 TABLETS BY MOUTH EVERY DAY AS DIRECTED 60 tablet 1   sulfamethoxazole-trimethoprim (BACTRIM DS) 800-160 MG tablet Take 1 tablet by mouth 3 (three) times a week. 12 tablet 5   triamcinolone (KENALOG) 0.1 % paste Apply dental paste to mouth ulcer twice daily as needed 5 g 1   fluconazole (DIFLUCAN) 100 MG tablet Take 100 mg by mouth daily.     apixaban (ELIQUIS) 2.5 MG TABS tablet Take 1 tablet (2.5 mg total) by mouth 2 (two) times daily. (Patient not taking: Reported on 01/26/2021) 60 tablet 3   benzonatate (TESSALON) 200 MG capsule Take 1 capsule (200 mg total) by mouth 3 (three) times daily as needed  for cough. (Patient not taking: No sig reported) 30 capsule 1   nystatin (MYCOSTATIN) 100000 UNIT/ML suspension Take 100,000 Units by mouth. (Patient not taking: Reported on 01/26/2021)     No facility-administered medications prior to visit.   Review of Systems  Review of Systems  Constitutional:  Negative for fever.  HENT: Negative.    Respiratory:  Positive for shortness of breath. Negative for cough, chest tightness and wheezing.   Cardiovascular:  Positive for leg swelling.  Gastrointestinal:  Positive for abdominal distention.    Physical Exam  BP 130/76 (BP Location: Right Arm, Patient Position: Sitting, Cuff Size: Normal)   Pulse (!) 107   Temp 97.7 F (36.5 C) (Oral)   Ht _0  (1.753 m)   Wt 166 lb (75.3 kg)   SpO2 96%   BMI 24.51 kg/m  Physical Exam Constitutional:      Appearance: Normal appearance.  HENT:     Mouth/Throat:  Mouth: Mucous membranes are dry.     Comments: Oral ulcer along with fissures to tongue  Cardiovascular:     Rate and Rhythm: Normal rate.  Pulmonary:     Effort: Pulmonary effort is normal.     Breath sounds: Rales present. No wheezing or rhonchi.  Musculoskeletal:     Comments: In [facility]  Neurological:     General: No focal deficit present.     Mental Status: He is alert and oriented to person, place, and time. Mental status is at baseline.  Psychiatric:        Mood and Affect: Mood normal.        Behavior: Behavior normal.        Thought Content: Thought content normal.        Judgment: Judgment normal.     Lab Results:  CBC    Component Value Date/Time   WBC 36.1 (H) 12/14/2020 1445   RBC 4.27 12/14/2020 1445   HGB 13.0 12/14/2020 1445   HCT 39.8 12/14/2020 1445   PLT 205 12/14/2020 1445   MCV 93.2 12/14/2020 1445   MCH 30.4 12/14/2020 1445   MCHC 32.7 12/14/2020 1445   RDW 16.7 (H) 12/14/2020 1445   LYMPHSABS 23.2 (H) 12/14/2020 1445   MONOABS 0.6 12/14/2020 1445   EOSABS 0.2 12/14/2020 1445   BASOSABS 0.1  12/14/2020 1445    BMET    Component Value Date/Time   NA 134 (L) 01/26/2021 1520   K 4.5 01/26/2021 1520   CL 101 01/26/2021 1520   CO2 25 01/26/2021 1520   GLUCOSE 286 (H) 01/26/2021 1520   BUN 37 (H) 01/26/2021 1520   CREATININE 2.00 (H) 01/26/2021 1520   CALCIUM 8.8 01/26/2021 1520   GFRNONAA 44 (L) 06/14/2020 0527    BNP    Component Value Date/Time   BNP 38.0 06/11/2020 2009    ProBNP    Component Value Date/Time   PROBNP 895 (H) 01/26/2021 1520    Imaging: DG Chest 2 View  Result Date: 01/26/2021 CLINICAL DATA:  Shortness of breath in a 80 year old male. EXAM: CHEST - 2 VIEW COMPARISON:  December 13, 2020. FINDINGS: Trachea midline. Cardiomediastinal contours and hilar structures grossly stable. Diffuse increased thickening of the interstitium shows some progressive worsening accounting for differences in technique as compared to studies dating back to September 01, 2020. There is no lobar level consolidative process. Findings are worse at the lung bases. No effusion. On limited assessment there is no acute skeletal process. IMPRESSION: Chronic interstitial lung disease with some progressive worsening since June of 2022. This may reflect worsening of interstitial disease, component of superimposed infection or edema could also cause this appearance. Follow-up with high-resolution chest CT may be helpful particularly if there is progressive worsening of shortness of breath. Electronically Signed   By: Zetta Bills M.D.   On: 01/26/2021 14:44     Assessment & Plan:   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[place]) - Patient reports increased shortness of breath x 2 weeks with associated leg swelling. CXR showed Chronic interstitial lung disease with some progressive worsening since June of 2022. This may reflect worsening of interstitial disease, component of superimposed infection or edema. Prednisone was tapered from 62m to 257min September. Patient has been having recurrent  thrush symptoms. Checking BNP, BMET.    Pulmonary embolism (HCC) - Hx small pulmonary embolism, completed 3 months of anticoagulation. Stopped Eliquis on 10/12 d/t epistaxis. Checking D-dimer.   Bilateral leg edema - Patient has +2-3 BLE  edema. Likely component of HF with known pulmonary HTN contributing to dyspnea. Lasix was increased to 56m BID x 3 days by Dr. MVaughan Browner Checking BNP.   40 mins spent on case; > 50% face to face  EMartyn Ehrich NP 01/27/2021

## 2021-01-27 ENCOUNTER — Telehealth: Payer: Self-pay | Admitting: Primary Care

## 2021-01-27 DIAGNOSIS — R7989 Other specified abnormal findings of blood chemistry: Secondary | ICD-10-CM

## 2021-01-27 LAB — PRO B NATRIURETIC PEPTIDE: NT-Pro BNP: 895 pg/mL — ABNORMAL HIGH (ref 0–486)

## 2021-01-27 NOTE — Progress Notes (Signed)
Please let patient/son know D-dimer was elevated, needs VQ scan (please order).   BNP was elevated which could be cause shortness of breath, kidney function was impaired. Due to this take lasix 39m twice a day for total 3 days, then resume once daily.  If he has a cardiology should follow back up with them.   Dr. MVaughan Browneradvised he go up to 477mprednisone for 14 days; then resume 2041maily.

## 2021-01-27 NOTE — Assessment & Plan Note (Signed)
-  Hx small pulmonary embolism, completed 3 months of anticoagulation. Stopped Eliquis on 10/12 d/t epistaxis. Checking D-dimer.

## 2021-01-27 NOTE — Assessment & Plan Note (Addendum)
-  Patient has +2-3 BLE edema. Likely component of HF with known pulmonary HTN contributing to dyspnea. Lasix was increased to 69m BID x 3 days by Dr. MVaughan Browner Checking BNP.

## 2021-01-27 NOTE — Assessment & Plan Note (Addendum)
-  Patient reports increased shortness of breath x 2 weeks with associated leg swelling. CXR showed Chronic interstitial lung disease with some progressive worsening since June of 2022. This may reflect worsening of interstitial disease, component of superimposed infection or edema. Prednisone was tapered from 39m to 28min September. Patient has been having recurrent thrush symptoms. Checking BNP, BMET.

## 2021-01-28 ENCOUNTER — Other Ambulatory Visit (HOSPITAL_COMMUNITY): Payer: PPO

## 2021-01-28 MED ORDER — PREDNISONE 20 MG PO TABS: 40.0000 mg | ORAL_TABLET | Freq: Every day | ORAL | 0 refills | Status: DC

## 2021-01-28 NOTE — Addendum Note (Signed)
Addended by: Claudette Head A on: 01/28/2021 10:25 AM   Modules accepted: Orders

## 2021-01-28 NOTE — Telephone Encounter (Signed)
Please let patient/son know D-dimer was elevated, needs VQ scan (please order).    BNP was elevated which could be cause shortness of breath, kidney function was impaired. Due to this take lasix 42m twice a day for total 3 days, then resume once daily.  If he has a cardiology should follow back up with them.    Dr. MVaughan Browneradvised he go up to 438mprednisone for 14 days; then resume 2071maily.   Patient is aware of results and voiced his understanding.  VQ has been ordered. He has enough prednisone and lasix to cover dosage change. He does not see a cardiologist.  BetEustaquio Maizeould you like to refer to cardiology.

## 2021-01-28 NOTE — Telephone Encounter (Signed)
Referral has been placed. Patient is aware and voiced his understanding.  Nothing further needed.

## 2021-01-28 NOTE — Telephone Encounter (Signed)
Yes we can 

## 2021-01-28 NOTE — Addendum Note (Signed)
Addended by: Claudette Head A on: 01/28/2021 10:46 AM   Modules accepted: Orders

## 2021-01-29 ENCOUNTER — Encounter (HOSPITAL_COMMUNITY): Payer: Self-pay | Admitting: *Deleted

## 2021-01-29 ENCOUNTER — Emergency Department (HOSPITAL_COMMUNITY): Payer: PPO

## 2021-01-29 ENCOUNTER — Inpatient Hospital Stay (HOSPITAL_COMMUNITY)
Admission: EM | Admit: 2021-01-29 | Discharge: 2021-02-02 | DRG: 175 | Disposition: A | Discharge status: Home / Self Care | Payer: PPO | Attending: Internal Medicine | Admitting: Internal Medicine

## 2021-01-29 DIAGNOSIS — N182 Chronic kidney disease, stage 2 (mild): Secondary | ICD-10-CM | POA: Diagnosis present

## 2021-01-29 DIAGNOSIS — K219 Gastro-esophageal reflux disease without esophagitis: Secondary | ICD-10-CM | POA: Diagnosis present

## 2021-01-29 DIAGNOSIS — L8990 Pressure ulcer of unspecified site, unspecified stage: Secondary | ICD-10-CM | POA: Diagnosis present

## 2021-01-29 DIAGNOSIS — F32A Depression, unspecified: Secondary | ICD-10-CM | POA: Diagnosis present

## 2021-01-29 DIAGNOSIS — Z20822 Contact with and (suspected) exposure to covid-19: Secondary | ICD-10-CM | POA: Diagnosis present

## 2021-01-29 DIAGNOSIS — Z7901 Long term (current) use of anticoagulants: Secondary | ICD-10-CM | POA: Diagnosis not present

## 2021-01-29 DIAGNOSIS — M109 Gout, unspecified: Secondary | ICD-10-CM | POA: Diagnosis present

## 2021-01-29 DIAGNOSIS — J9621 Acute and chronic respiratory failure with hypoxia: Secondary | ICD-10-CM | POA: Diagnosis present

## 2021-01-29 DIAGNOSIS — R0600 Dyspnea, unspecified: Secondary | ICD-10-CM

## 2021-01-29 DIAGNOSIS — Z515 Encounter for palliative care: Secondary | ICD-10-CM | POA: Diagnosis not present

## 2021-01-29 DIAGNOSIS — I82411 Acute embolism and thrombosis of right femoral vein: Secondary | ICD-10-CM | POA: Diagnosis not present

## 2021-01-29 DIAGNOSIS — C9110 Chronic lymphocytic leukemia of B-cell type not having achieved remission: Secondary | ICD-10-CM | POA: Diagnosis present

## 2021-01-29 DIAGNOSIS — J8410 Pulmonary fibrosis, unspecified: Secondary | ICD-10-CM | POA: Diagnosis present

## 2021-01-29 DIAGNOSIS — R7989 Other specified abnormal findings of blood chemistry: Secondary | ICD-10-CM | POA: Diagnosis not present

## 2021-01-29 DIAGNOSIS — L89152 Pressure ulcer of sacral region, stage 2: Secondary | ICD-10-CM | POA: Diagnosis present

## 2021-01-29 DIAGNOSIS — N179 Acute kidney failure, unspecified: Secondary | ICD-10-CM | POA: Diagnosis present

## 2021-01-29 DIAGNOSIS — Z8249 Family history of ischemic heart disease and other diseases of the circulatory system: Secondary | ICD-10-CM

## 2021-01-29 DIAGNOSIS — D631 Anemia in chronic kidney disease: Secondary | ICD-10-CM | POA: Diagnosis present

## 2021-01-29 DIAGNOSIS — B370 Candidal stomatitis: Secondary | ICD-10-CM

## 2021-01-29 DIAGNOSIS — I2699 Other pulmonary embolism without acute cor pulmonale: Principal | ICD-10-CM | POA: Diagnosis present

## 2021-01-29 DIAGNOSIS — E139 Other specified diabetes mellitus without complications: Secondary | ICD-10-CM | POA: Diagnosis not present

## 2021-01-29 DIAGNOSIS — I129 Hypertensive chronic kidney disease with stage 1 through stage 4 chronic kidney disease, or unspecified chronic kidney disease: Secondary | ICD-10-CM | POA: Diagnosis present

## 2021-01-29 DIAGNOSIS — Z87891 Personal history of nicotine dependence: Secondary | ICD-10-CM | POA: Diagnosis not present

## 2021-01-29 DIAGNOSIS — R040 Epistaxis: Secondary | ICD-10-CM | POA: Diagnosis not present

## 2021-01-29 DIAGNOSIS — I517 Cardiomegaly: Secondary | ICD-10-CM | POA: Diagnosis not present

## 2021-01-29 DIAGNOSIS — L89151 Pressure ulcer of sacral region, stage 1: Secondary | ICD-10-CM

## 2021-01-29 DIAGNOSIS — I8003 Phlebitis and thrombophlebitis of superficial vessels of lower extremities, bilateral: Secondary | ICD-10-CM | POA: Diagnosis present

## 2021-01-29 DIAGNOSIS — Z79899 Other long term (current) drug therapy: Secondary | ICD-10-CM

## 2021-01-29 DIAGNOSIS — Z881 Allergy status to other antibiotic agents status: Secondary | ICD-10-CM

## 2021-01-29 DIAGNOSIS — R0602 Shortness of breath: Secondary | ICD-10-CM | POA: Diagnosis not present

## 2021-01-29 DIAGNOSIS — Z96641 Presence of right artificial hip joint: Secondary | ICD-10-CM | POA: Diagnosis present

## 2021-01-29 DIAGNOSIS — Z22322 Carrier or suspected carrier of Methicillin resistant Staphylococcus aureus: Secondary | ICD-10-CM

## 2021-01-29 DIAGNOSIS — Z794 Long term (current) use of insulin: Secondary | ICD-10-CM | POA: Diagnosis not present

## 2021-01-29 DIAGNOSIS — E1322 Other specified diabetes mellitus with diabetic chronic kidney disease: Secondary | ICD-10-CM | POA: Diagnosis present

## 2021-01-29 DIAGNOSIS — R0609 Other forms of dyspnea: Secondary | ICD-10-CM

## 2021-01-29 DIAGNOSIS — Z7189 Other specified counseling: Secondary | ICD-10-CM | POA: Diagnosis not present

## 2021-01-29 DIAGNOSIS — Z9981 Dependence on supplemental oxygen: Secondary | ICD-10-CM

## 2021-01-29 DIAGNOSIS — Z7984 Long term (current) use of oral hypoglycemic drugs: Secondary | ICD-10-CM

## 2021-01-29 DIAGNOSIS — J9620 Acute and chronic respiratory failure, unspecified whether with hypoxia or hypercapnia: Secondary | ICD-10-CM | POA: Diagnosis present

## 2021-01-29 DIAGNOSIS — Z888 Allergy status to other drugs, medicaments and biological substances status: Secondary | ICD-10-CM

## 2021-01-29 DIAGNOSIS — Z7952 Long term (current) use of systemic steroids: Secondary | ICD-10-CM

## 2021-01-29 LAB — CBC WITH DIFFERENTIAL/PLATELET
Abs Immature Granulocytes: 0.13 10*3/uL — ABNORMAL HIGH (ref 0.00–0.07)
Basophils Absolute: 0 10*3/uL (ref 0.0–0.1)
Basophils Relative: 0 %
Eosinophils Absolute: 0 10*3/uL (ref 0.0–0.5)
Eosinophils Relative: 0 %
HCT: 40.3 % (ref 39.0–52.0)
Hemoglobin: 13.3 g/dL (ref 13.0–17.0)
Immature Granulocytes: 1 %
Lymphocytes Relative: 55 %
Lymphs Abs: 11.8 10*3/uL — ABNORMAL HIGH (ref 0.7–4.0)
MCH: 30.4 pg (ref 26.0–34.0)
MCHC: 33 g/dL (ref 30.0–36.0)
MCV: 92 fL (ref 80.0–100.0)
Monocytes Absolute: 0.2 10*3/uL (ref 0.1–1.0)
Monocytes Relative: 1 %
Neutro Abs: 9 10*3/uL — ABNORMAL HIGH (ref 1.7–7.7)
Neutrophils Relative %: 43 %
Platelets: 163 10*3/uL (ref 150–400)
RBC: 4.38 MIL/uL (ref 4.22–5.81)
RDW: 15.9 % — ABNORMAL HIGH (ref 11.5–15.5)
WBC Morphology: ABNORMAL
WBC: 21.6 10*3/uL — ABNORMAL HIGH (ref 4.0–10.5)
nRBC: 0 % (ref 0.0–0.2)

## 2021-01-29 LAB — COMPREHENSIVE METABOLIC PANEL
ALT: 28 U/L (ref 0–44)
AST: 21 U/L (ref 15–41)
Albumin: 3.3 g/dL — ABNORMAL LOW (ref 3.5–5.0)
Alkaline Phosphatase: 93 U/L (ref 38–126)
Anion gap: 10 (ref 5–15)
BUN: 43 mg/dL — ABNORMAL HIGH (ref 8–23)
CO2: 25 mmol/L (ref 22–32)
Calcium: 8.8 mg/dL — ABNORMAL LOW (ref 8.9–10.3)
Chloride: 99 mmol/L (ref 98–111)
Creatinine, Ser: 2.03 mg/dL — ABNORMAL HIGH (ref 0.61–1.24)
GFR, Estimated: 33 mL/min — ABNORMAL LOW (ref >60)
Glucose, Bld: 263 mg/dL — ABNORMAL HIGH (ref 70–99)
Potassium: 4.8 mmol/L (ref 3.5–5.1)
Sodium: 134 mmol/L — ABNORMAL LOW (ref 135–145)
Total Bilirubin: 0.1 mg/dL — ABNORMAL LOW (ref 0.3–1.2)
Total Protein: 6.2 g/dL — ABNORMAL LOW (ref 6.5–8.1)

## 2021-01-29 LAB — PROCALCITONIN: Procalcitonin: <0.1 ng/mL

## 2021-01-29 LAB — RESP PANEL BY RT-PCR (FLU A&B, COVID) ARPGX2
Influenza A by PCR: NEGATIVE
Influenza B by PCR: NEGATIVE
SARS Coronavirus 2 by RT PCR: NEGATIVE

## 2021-01-29 LAB — TROPONIN I (HIGH SENSITIVITY)
Troponin I (High Sensitivity): 24 ng/L — ABNORMAL HIGH (ref <18)
Troponin I (High Sensitivity): 23 ng/L — ABNORMAL HIGH (ref <18)

## 2021-01-29 LAB — CBG MONITORING, ED: Glucose-Capillary: 375 mg/dL — ABNORMAL HIGH (ref 70–99)

## 2021-01-29 LAB — BRAIN NATRIURETIC PEPTIDE: B Natriuretic Peptide: 91 pg/mL (ref 0.0–100.0)

## 2021-01-29 MED ORDER — METOPROLOL TARTRATE 5 MG/5ML IV SOLN: 2.5000 mg | Freq: Once | INTRAVENOUS | Status: DC

## 2021-01-29 MED ORDER — IPRATROPIUM-ALBUTEROL 0.5-2.5 (3) MG/3ML IN SOLN
3.0000 mL | Freq: Once | RESPIRATORY_TRACT | Status: AC
  Administered 2021-01-29: 3 mL via RESPIRATORY_TRACT
  Filled 2021-01-29: qty 3

## 2021-01-29 MED ORDER — ONDANSETRON HCL 4 MG PO TABS: 4.0000 mg | ORAL_TABLET | Freq: Four times a day (QID) | ORAL | Status: DC | PRN

## 2021-01-29 MED ORDER — PRAMIPEXOLE DIHYDROCHLORIDE 0.25 MG PO TABS
0.2500 mg | ORAL_TABLET | Freq: Two times a day (BID) | ORAL | Status: DC
  Administered 2021-01-29 – 2021-02-02 (×7): 0.25 mg via ORAL
  Filled 2021-01-29: qty 2
  Filled 2021-01-29: qty 1
  Filled 2021-01-29 (×7): qty 2
  Filled 2021-01-29: qty 1
  Filled 2021-01-29: qty 2
  Filled 2021-01-29 (×2): qty 1
  Filled 2021-01-29: qty 2

## 2021-01-29 MED ORDER — IPRATROPIUM-ALBUTEROL 0.5-2.5 (3) MG/3ML IN SOLN
3.0000 mL | Freq: Four times a day (QID) | RESPIRATORY_TRACT | Status: DC
  Administered 2021-01-30 (×3): 3 mL via RESPIRATORY_TRACT
  Filled 2021-01-29 (×3): qty 3

## 2021-01-29 MED ORDER — SILVER SULFADIAZINE 1 % EX CREA
TOPICAL_CREAM | Freq: Every day | CUTANEOUS | Status: DC
  Filled 2021-01-29: qty 50

## 2021-01-29 MED ORDER — OXYCODONE HCL 5 MG PO TABS
5.0000 mg | ORAL_TABLET | ORAL | Status: DC | PRN
  Administered 2021-01-30: 5 mg via ORAL
  Filled 2021-01-29: qty 1

## 2021-01-29 MED ORDER — INSULIN ASPART 100 UNIT/ML IJ SOLN
0.0000 [IU] | Freq: Every day | INTRAMUSCULAR | Status: DC
  Administered 2021-01-29: 5 [IU] via SUBCUTANEOUS
  Filled 2021-01-29: qty 1

## 2021-01-29 MED ORDER — INSULIN ASPART 100 UNIT/ML IJ SOLN
0.0000 [IU] | Freq: Three times a day (TID) | INTRAMUSCULAR | Status: DC
  Administered 2021-01-30: 15 [IU] via SUBCUTANEOUS

## 2021-01-29 MED ORDER — ALBUTEROL SULFATE (2.5 MG/3ML) 0.083% IN NEBU
2.5000 mg | INHALATION_SOLUTION | Freq: Once | RESPIRATORY_TRACT | Status: AC
  Administered 2021-01-29: 2.5 mg via RESPIRATORY_TRACT
  Filled 2021-01-29: qty 3

## 2021-01-29 MED ORDER — CITALOPRAM HYDROBROMIDE 20 MG PO TABS
40.0000 mg | ORAL_TABLET | Freq: Every day | ORAL | Status: DC
  Administered 2021-01-30 – 2021-01-31 (×2): 40 mg via ORAL
  Filled 2021-01-29 (×2): qty 2

## 2021-01-29 MED ORDER — ACETAMINOPHEN 650 MG RE SUPP: 650.0000 mg | Freq: Four times a day (QID) | RECTAL | Status: DC | PRN

## 2021-01-29 MED ORDER — HEPARIN SODIUM (PORCINE) 5000 UNIT/ML IJ SOLN
5000.0000 [IU] | Freq: Three times a day (TID) | INTRAMUSCULAR | Status: DC
  Administered 2021-01-29 – 2021-01-30 (×3): 5000 [IU] via SUBCUTANEOUS
  Filled 2021-01-29 (×3): qty 1

## 2021-01-29 MED ORDER — METHYLPREDNISOLONE SODIUM SUCC 125 MG IJ SOLR
125.0000 mg | Freq: Once | INTRAMUSCULAR | Status: AC
  Administered 2021-01-29: 125 mg via INTRAVENOUS
  Filled 2021-01-29: qty 2

## 2021-01-29 MED ORDER — NYSTATIN 100000 UNIT/ML MT SUSP
5.0000 mL | Freq: Four times a day (QID) | OROMUCOSAL | Status: DC
  Administered 2021-01-29 – 2021-02-02 (×14): 500000 [IU] via OROMUCOSAL
  Filled 2021-01-29 (×14): qty 5

## 2021-01-29 MED ORDER — MAGNESIUM SULFATE 2 GM/50ML IV SOLN
2.0000 g | Freq: Once | INTRAVENOUS | Status: AC
  Administered 2021-01-29: 2 g via INTRAVENOUS
  Filled 2021-01-29: qty 50

## 2021-01-29 MED ORDER — ALBUTEROL SULFATE (2.5 MG/3ML) 0.083% IN NEBU: 2.5000 mg | INHALATION_SOLUTION | RESPIRATORY_TRACT | Status: DC | PRN

## 2021-01-29 MED ORDER — ACETAMINOPHEN 325 MG PO TABS: 650.0000 mg | ORAL_TABLET | Freq: Four times a day (QID) | ORAL | Status: DC | PRN

## 2021-01-29 MED ORDER — ONDANSETRON HCL 4 MG/2ML IJ SOLN: 4.0000 mg | Freq: Four times a day (QID) | INTRAMUSCULAR | Status: DC | PRN

## 2021-01-29 MED ORDER — PREDNISONE 20 MG PO TABS
40.0000 mg | ORAL_TABLET | Freq: Every day | ORAL | Status: DC
  Administered 2021-01-30: 40 mg via ORAL
  Filled 2021-01-29: qty 2

## 2021-01-29 MED ORDER — TRAZODONE HCL 50 MG PO TABS
50.0000 mg | ORAL_TABLET | Freq: Every evening | ORAL | Status: DC | PRN
  Administered 2021-01-30 – 2021-01-31 (×2): 50 mg via ORAL
  Filled 2021-01-29 (×2): qty 1

## 2021-01-29 MED ORDER — MORPHINE SULFATE (PF) 2 MG/ML IV SOLN: 2.0000 mg | INTRAVENOUS | Status: DC | PRN
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
## 2021-01-29 NOTE — ED Triage Notes (Signed)
Patient has increased shortness of breath, history of pulmonary fibrosis

## 2021-01-30 ENCOUNTER — Other Ambulatory Visit: Payer: Self-pay

## 2021-01-30 ENCOUNTER — Observation Stay (HOSPITAL_COMMUNITY): Payer: PPO

## 2021-01-30 ENCOUNTER — Encounter (HOSPITAL_COMMUNITY): Payer: Self-pay | Admitting: Family Medicine

## 2021-01-30 ENCOUNTER — Inpatient Hospital Stay (HOSPITAL_COMMUNITY): Payer: PPO

## 2021-01-30 DIAGNOSIS — J9621 Acute and chronic respiratory failure with hypoxia: Secondary | ICD-10-CM | POA: Diagnosis present

## 2021-01-30 DIAGNOSIS — I2699 Other pulmonary embolism without acute cor pulmonale: Secondary | ICD-10-CM | POA: Diagnosis not present

## 2021-01-30 DIAGNOSIS — Z79899 Other long term (current) drug therapy: Secondary | ICD-10-CM | POA: Diagnosis not present

## 2021-01-30 DIAGNOSIS — I129 Hypertensive chronic kidney disease with stage 1 through stage 4 chronic kidney disease, or unspecified chronic kidney disease: Secondary | ICD-10-CM | POA: Diagnosis present

## 2021-01-30 DIAGNOSIS — Z7189 Other specified counseling: Secondary | ICD-10-CM | POA: Diagnosis not present

## 2021-01-30 DIAGNOSIS — L89151 Pressure ulcer of sacral region, stage 1: Secondary | ICD-10-CM | POA: Diagnosis present

## 2021-01-30 DIAGNOSIS — Z515 Encounter for palliative care: Secondary | ICD-10-CM | POA: Diagnosis not present

## 2021-01-30 DIAGNOSIS — K219 Gastro-esophageal reflux disease without esophagitis: Secondary | ICD-10-CM | POA: Diagnosis present

## 2021-01-30 DIAGNOSIS — C9110 Chronic lymphocytic leukemia of B-cell type not having achieved remission: Secondary | ICD-10-CM | POA: Diagnosis present

## 2021-01-30 DIAGNOSIS — Z96641 Presence of right artificial hip joint: Secondary | ICD-10-CM | POA: Diagnosis present

## 2021-01-30 DIAGNOSIS — Z87891 Personal history of nicotine dependence: Secondary | ICD-10-CM | POA: Diagnosis not present

## 2021-01-30 DIAGNOSIS — R0602 Shortness of breath: Secondary | ICD-10-CM | POA: Diagnosis not present

## 2021-01-30 DIAGNOSIS — M109 Gout, unspecified: Secondary | ICD-10-CM | POA: Diagnosis present

## 2021-01-30 DIAGNOSIS — B370 Candidal stomatitis: Secondary | ICD-10-CM | POA: Diagnosis present

## 2021-01-30 DIAGNOSIS — I517 Cardiomegaly: Secondary | ICD-10-CM | POA: Diagnosis not present

## 2021-01-30 DIAGNOSIS — R0600 Dyspnea, unspecified: Secondary | ICD-10-CM | POA: Diagnosis not present

## 2021-01-30 DIAGNOSIS — F32A Depression, unspecified: Secondary | ICD-10-CM | POA: Diagnosis present

## 2021-01-30 DIAGNOSIS — R7989 Other specified abnormal findings of blood chemistry: Secondary | ICD-10-CM | POA: Diagnosis not present

## 2021-01-30 DIAGNOSIS — N182 Chronic kidney disease, stage 2 (mild): Secondary | ICD-10-CM | POA: Diagnosis present

## 2021-01-30 DIAGNOSIS — Z20822 Contact with and (suspected) exposure to covid-19: Secondary | ICD-10-CM | POA: Diagnosis present

## 2021-01-30 DIAGNOSIS — E139 Other specified diabetes mellitus without complications: Secondary | ICD-10-CM | POA: Diagnosis not present

## 2021-01-30 DIAGNOSIS — N179 Acute kidney failure, unspecified: Secondary | ICD-10-CM | POA: Diagnosis present

## 2021-01-30 DIAGNOSIS — L89152 Pressure ulcer of sacral region, stage 2: Secondary | ICD-10-CM | POA: Diagnosis present

## 2021-01-30 DIAGNOSIS — J8410 Pulmonary fibrosis, unspecified: Secondary | ICD-10-CM | POA: Diagnosis present

## 2021-01-30 DIAGNOSIS — D631 Anemia in chronic kidney disease: Secondary | ICD-10-CM | POA: Diagnosis present

## 2021-01-30 DIAGNOSIS — Z8249 Family history of ischemic heart disease and other diseases of the circulatory system: Secondary | ICD-10-CM | POA: Diagnosis not present

## 2021-01-30 DIAGNOSIS — J9620 Acute and chronic respiratory failure, unspecified whether with hypoxia or hypercapnia: Secondary | ICD-10-CM | POA: Diagnosis present

## 2021-01-30 DIAGNOSIS — Z794 Long term (current) use of insulin: Secondary | ICD-10-CM | POA: Diagnosis not present

## 2021-01-30 DIAGNOSIS — Z7901 Long term (current) use of anticoagulants: Secondary | ICD-10-CM | POA: Diagnosis not present

## 2021-01-30 DIAGNOSIS — E1322 Other specified diabetes mellitus with diabetic chronic kidney disease: Secondary | ICD-10-CM | POA: Diagnosis present

## 2021-01-30 DIAGNOSIS — I8003 Phlebitis and thrombophlebitis of superficial vessels of lower extremities, bilateral: Secondary | ICD-10-CM | POA: Diagnosis present

## 2021-01-30 DIAGNOSIS — R040 Epistaxis: Secondary | ICD-10-CM | POA: Diagnosis not present

## 2021-01-30 LAB — COMPREHENSIVE METABOLIC PANEL
ALT: 28 U/L (ref 0–44)
AST: 22 U/L (ref 15–41)
Albumin: 3.1 g/dL — ABNORMAL LOW (ref 3.5–5.0)
Alkaline Phosphatase: 87 U/L (ref 38–126)
Anion gap: 11 (ref 5–15)
BUN: 46 mg/dL — ABNORMAL HIGH (ref 8–23)
CO2: 22 mmol/L (ref 22–32)
Calcium: 8.9 mg/dL (ref 8.9–10.3)
Chloride: 101 mmol/L (ref 98–111)
Creatinine, Ser: 2.18 mg/dL — ABNORMAL HIGH (ref 0.61–1.24)
GFR, Estimated: 30 mL/min — ABNORMAL LOW (ref >60)
Glucose, Bld: 326 mg/dL — ABNORMAL HIGH (ref 70–99)
Potassium: 4.9 mmol/L (ref 3.5–5.1)
Sodium: 134 mmol/L — ABNORMAL LOW (ref 135–145)
Total Bilirubin: 0.4 mg/dL (ref 0.3–1.2)
Total Protein: 5.8 g/dL — ABNORMAL LOW (ref 6.5–8.1)

## 2021-01-30 LAB — CBC
HCT: 36.8 % — ABNORMAL LOW (ref 39.0–52.0)
Hemoglobin: 12 g/dL — ABNORMAL LOW (ref 13.0–17.0)
MCH: 30.2 pg (ref 26.0–34.0)
MCHC: 32.6 g/dL (ref 30.0–36.0)
MCV: 92.5 fL (ref 80.0–100.0)
Platelets: 144 10*3/uL — ABNORMAL LOW (ref 150–400)
RBC: 3.98 MIL/uL — ABNORMAL LOW (ref 4.22–5.81)
RDW: 15.9 % — ABNORMAL HIGH (ref 11.5–15.5)
WBC: 22.2 10*3/uL — ABNORMAL HIGH (ref 4.0–10.5)
nRBC: 0.1 % (ref 0.0–0.2)

## 2021-01-30 LAB — HEMOGLOBIN A1C
Hgb A1c MFr Bld: 7.8 % — ABNORMAL HIGH (ref 4.8–5.6)
Mean Plasma Glucose: 177.16 mg/dL

## 2021-01-30 LAB — CBG MONITORING, ED
Glucose-Capillary: 149 mg/dL — ABNORMAL HIGH (ref 70–99)
Glucose-Capillary: 503 mg/dL (ref 70–99)
Glucose-Capillary: 226 mg/dL — ABNORMAL HIGH (ref 70–99)

## 2021-01-30 LAB — GLUCOSE, CAPILLARY
Glucose-Capillary: 59 mg/dL — ABNORMAL LOW (ref 70–99)
Glucose-Capillary: 82 mg/dL (ref 70–99)
Glucose-Capillary: 99 mg/dL (ref 70–99)

## 2021-01-30 MED ORDER — FLUCONAZOLE 150 MG PO TABS: 150.0000 mg | ORAL_TABLET | Freq: Every day | ORAL | Status: DC

## 2021-01-30 MED ORDER — FUROSEMIDE 10 MG/ML IJ SOLN
40.0000 mg | Freq: Once | INTRAMUSCULAR | Status: AC
  Administered 2021-01-30: 40 mg via INTRAVENOUS
  Filled 2021-01-30: qty 4

## 2021-01-30 MED ORDER — INSULIN ASPART 100 UNIT/ML IJ SOLN: 4.0000 [IU] | Freq: Three times a day (TID) | INTRAMUSCULAR | Status: DC

## 2021-01-30 MED ORDER — ENOXAPARIN SODIUM 80 MG/0.8ML IJ SOSY
75.0000 mg | PREFILLED_SYRINGE | INTRAMUSCULAR | Status: DC
  Filled 2021-01-30: qty 0.8

## 2021-01-30 MED ORDER — ALLOPURINOL 300 MG PO TABS
300.0000 mg | ORAL_TABLET | Freq: Every day | ORAL | Status: DC
  Administered 2021-01-30 – 2021-02-02 (×4): 300 mg via ORAL
  Filled 2021-01-30 (×4): qty 1

## 2021-01-30 MED ORDER — ENOXAPARIN SODIUM 80 MG/0.8ML IJ SOSY
80.0000 mg | PREFILLED_SYRINGE | INTRAMUSCULAR | Status: DC
  Administered 2021-01-30 – 2021-01-31 (×2): 80 mg via SUBCUTANEOUS
  Filled 2021-01-30: qty 0.8

## 2021-01-30 MED ORDER — FLUTICASONE PROPIONATE 50 MCG/ACT NA SUSP
2.0000 | Freq: Every day | NASAL | Status: DC | PRN
  Administered 2021-01-30 – 2021-01-31 (×2): 2 via NASAL
  Filled 2021-01-30: qty 16

## 2021-01-30 MED ORDER — IPRATROPIUM-ALBUTEROL 0.5-2.5 (3) MG/3ML IN SOLN
3.0000 mL | Freq: Three times a day (TID) | RESPIRATORY_TRACT | Status: DC
  Administered 2021-01-30 – 2021-02-01 (×5): 3 mL via RESPIRATORY_TRACT
  Filled 2021-01-30 (×5): qty 3

## 2021-01-30 MED ORDER — FLUCONAZOLE 100 MG PO TABS
100.0000 mg | ORAL_TABLET | Freq: Every day | ORAL | Status: DC
  Administered 2021-01-30 – 2021-02-02 (×4): 100 mg via ORAL
  Filled 2021-01-30 (×4): qty 1

## 2021-01-30 MED ORDER — SALINE SPRAY 0.65 % NA SOLN
1.0000 | NASAL | Status: DC | PRN
  Administered 2021-01-30: 1 via NASAL
  Filled 2021-01-30: qty 44

## 2021-01-30 MED ORDER — TECHNETIUM TO 99M ALBUMIN AGGREGATED
4.3000 | Freq: Once | INTRAVENOUS | Status: AC | PRN
  Administered 2021-01-30: 4.3 via INTRAVENOUS

## 2021-01-30 MED ORDER — ORAL CARE MOUTH RINSE
15.0000 mL | Freq: Two times a day (BID) | OROMUCOSAL | Status: DC
  Administered 2021-01-30 – 2021-02-02 (×6): 15 mL via OROMUCOSAL

## 2021-01-30 MED ORDER — INSULIN ASPART 100 UNIT/ML IJ SOLN
0.0000 [IU] | Freq: Three times a day (TID) | INTRAMUSCULAR | Status: DC
  Administered 2021-01-30: 7 [IU] via SUBCUTANEOUS
  Filled 2021-01-30: qty 1

## 2021-01-30 MED ORDER — PANTOPRAZOLE SODIUM 40 MG PO TBEC
40.0000 mg | DELAYED_RELEASE_TABLET | Freq: Every day | ORAL | Status: DC
  Administered 2021-01-30 – 2021-02-02 (×4): 40 mg via ORAL
  Filled 2021-01-30 (×4): qty 1

## 2021-01-30 MED ORDER — INSULIN ASPART 100 UNIT/ML IJ SOLN: 0.0000 [IU] | Freq: Every day | INTRAMUSCULAR | Status: DC

## 2021-01-30 MED ORDER — CHLORHEXIDINE GLUCONATE CLOTH 2 % EX PADS
6.0000 | MEDICATED_PAD | Freq: Every day | CUTANEOUS | Status: DC
  Administered 2021-01-30 – 2021-02-02 (×4): 6 via TOPICAL

## 2021-01-30 MED ORDER — INSULIN ASPART 100 UNIT/ML IJ SOLN: 6.0000 [IU] | Freq: Three times a day (TID) | INTRAMUSCULAR | Status: DC

## 2021-01-30 MED ORDER — ENOXAPARIN SODIUM 80 MG/0.8ML IJ SOSY: 80.0000 mg | PREFILLED_SYRINGE | INTRAMUSCULAR | Status: DC

## 2021-01-30 MED ORDER — INSULIN GLARGINE-YFGN 100 UNIT/ML ~~LOC~~ SOLN
14.0000 [IU] | Freq: Every day | SUBCUTANEOUS | Status: DC
  Filled 2021-01-30 (×2): qty 0.14

## 2021-01-30 MED ORDER — LORATADINE 10 MG PO TABS
10.0000 mg | ORAL_TABLET | Freq: Every day | ORAL | Status: DC
  Administered 2021-01-30 – 2021-02-02 (×4): 10 mg via ORAL
  Filled 2021-01-30 (×4): qty 1

## 2021-01-30 NOTE — Progress Notes (Signed)
01/30/2021 1:56 PM  I received call from radiology that V/Q scan findings: New wedge-shaped peripheral perfusion defects (RIGHT lower lobe and  LEFT upper lobe) consistent with acute pulmonary emboli.  Pt has had difficulty with tolerating apixaban due to recurrent epistaxis and was recently taken off by his pulmonologist.    I had a long talk with patient and daughter-in-law today about the results and options.  He remains very symptomatic from his PE.  He wants to try enoxaparin injections for anticoagulation.  They both verbalized understanding of the risks of further bleeding events, etc.   Pt wants to proceed with anticoagulation.  Will ask pharmacist to dose enoxaparin injections.  Monitor labs and monitor for bleeding complications.   Will ask for hematology consult in AM 10/31.  Pulmonary consult requested for 10/31.  If he cannot tolerate anticoagulation he would be amenable to IVC filter placement although with his age and co-morbidities he may not be a candidate for the procedure.    Murvin Natal, MD

## 2021-01-30 NOTE — Progress Notes (Signed)
At 1706 patient's CBG was 59. Held meal coverage insulin and notified provider. Long-acting insulin held for this evening. Provided patient with cup of orange juice and his dinner tray. Rechecked CBG at [place] and is now 85.
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
## 2021-01-30 NOTE — Consult Note (Signed)
[place] Nurse Consult Note: Reason for Consult:Stage 2 pressure injury, POA Wound type:pressure Pressure Injury POA: Yes Measurement:2.5cm x 2cm x 0.2cm Wound PTY:YPEJ, moist Drainage (amount, consistency, odor) serous, scant Periwound:intact, dry Dressing procedure/placement/frequency: I have provided Nursing with guidance via the Orders for turning and repositioning to avoid the supine position and to float the heels using our house Prevalon boots for pressure redistribution. Topical care to the Stage 2 PI will be with xeroform gauze topped with dry gauze and covered with a silicone foam bordered dressing with the xeroform changed daily. The silicone foam may be reused for up to 3 days. Patient is currently on a mattress replacement with low air loss feature as he is in the ICU.  [place] nursing team will not follow, but will remain available to this patient, the nursing and medical teams.  Please re-consult if needed. Thanks, Maudie Flakes, MSN, RN, [place], [place]  Pager# [phone]

## 2021-01-30 NOTE — Progress Notes (Signed)
PROGRESS NOTE   Jeffrey Brennan  IRS:[id number] DOB: 1940/06/06 DOA: 01/29/2021 PCP: [name], PA   Chief Complaint  Patient presents with   Shortness of Breath   Level of care: Stepdown  Brief Admission History:   80 y.o. male, with history of chronic renal insufficiency, CLL, diabetes type 2, hypertension, pulmonary fibrosis, and more presents ED with a CC of acute dyspnea.   Patient reports that he is woke up suddenly out of sleep at 4 AM due to worsening dyspnea.  He reports he was able to tripod to get the dyspnea to improve, and he was back to sleep.  Again it happened at 6 AM.  He reports that his dyspnea is coming in waves.  It is especially worse if he has any minimal exertion.  Is also worse if he lays on back.  Patient reports that he has had acute onset of lower extremity peripheral edema for which he is seeing an outpatient physician.  They increased his Lasix from 40 mg daily to 40 mg twice daily, patient reports no improvement.  Patient reports that he has not had much urine output either.  Recently saw [facility].  Their note reveals that patient has not been able to tolerate antifibrotics.  Not a candidate for clinical trials given his CLL.  He was being seen for worsening dyspnea.  He did have proBNP which was mildly elevated at 895.  BNP here was 91.   I also did a D-dimer which came back elevated at 1.65.  Patient was scheduled to have a VQ scan on Monday.  Patient's last echo was in September and showed ejection fraction of 60 to 65% with indeterminate diastolic parameters.  It is worth noting that he just finished a 54-monthcourse of Eliquis for pulmonary embolism.  In June 2022 he had a small segmental defect on his VQ scan.  He had bouts of epistaxis on Eliquis, even when adjusted down to 2.5 mg patient was in the ER for epistaxis and Dr. MWonda Amishad him to stop taking in mid Oct 2022.    Assessment & Plan:   Active Problems:   Diabetes 1.5, managed as type 2  (HCC)   Pulmonary fibrosis (HCC)   Elevated d-dimer   Pressure ulcer   AK[place] kidney injury) [place]   Oral candidiasis   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) (HCC)   Acute and chronic respiratory failure with hypoxia (HCC)  Acute pulmonary emboli  - VQ scan done 10/30 with findings of a new wedge-shaped peripheral perfusion defects in the right lower lobe and left upper lobe consistent with acute pulmonary emboli. -Unfortunately this presents a challenge given the patient did not tolerate apixaban well and had several bouts of epistaxis. -I discussed with him and his daughter-in-law at the bedside the risks and possible benefits of anticoagulation and at this time he wants to proceed with trial of anticoagulation.  We will start him on enoxaparin.  Will ask for hematology recommendation.   - Pt understands and verbalized understanding of risks of severe bleeding complication.  We also discussed IVC filter.  Pt wants to try anticoagulation again.  - check limited 2D Echo for right heart strain.  - palliative care consultation for goals of care requested   Acute on chronic respiratory failure  - exacerbated by acute pulmonary emboli - pt very SOB at this time and with his pulmonary fibrosis will ask his pulmonary team to see him as well.  -  continue supplemental oxygen. Pt reports he is Do Not Intubate.   AKI on CKD stage 2 - recent worsening of renal function in last few months  - he reports not responding to oral lasix (tried as outpatient 20-40 mg p.o.) - trial of IV lasix  - follow daily renal function panel   Severe acute peripheral edema  - he recently developed 2+ edema in Bilateral lower extremities - check for deep venous thrombosis given acute PE - TED hoses ordered - IV lasix ordered  Pulmonary Fibrosis  - Pt is steroid dependent which has been continued - He remains very symptomatically short of breath  - He reports quality of life seriously  affected - consult to his pulmonologist  - consult to palliative care for goals of care discussions   Oral candidiasis  - from daily steroids - continue fluconazole therapy  Type 2 diabetes mellitus  - continue SSI coverage and frequent CBG monitoring, add semglee   CLL - labs stable  - no s/s of infection   Stage 1 sacral pressure wound - wound care as ordered.   DVT prophylaxis: enoxaparin  Code Status: DNI  Family Communication: daughter-in-law at bedside  Disposition: TBD  Status is: Inpatient  Remains inpatient appropriate because: requiring IV treatments    Consultants:  Pulm Cardio Hem/onc   Procedures:  VQ scan 10/30   Antimicrobials:  N/a   Subjective: Pt remains very short of breath, no chest pain, acute swelling in both legs   Objective: Vitals:   01/30/21 1300 01/30/21 1405 01/30/21 1430 01/30/21 1500  BP: 132/83  133/71 118/73  Pulse: 98  (!) 111 (!) 107  Resp:   (!) 24 19  Temp:      TempSrc:      SpO2: 98% 97% 94% 94%   No intake or output data in the 24 hours ending 01/30/21 1547 There were no vitals filed for this visit.  Examination:  General exam: Appears calm and comfortable  Respiratory system: Clear to auscultation. Respiratory effort normal. Cardiovascular system: normal S1 & S2 heard. No JVD, murmurs, rubs, gallops or clicks. No pedal edema. Gastrointestinal system: Abdomen is nondistended, soft and nontender. No organomegaly or masses felt. Normal bowel sounds heard. Central nervous system: Alert and oriented. No focal neurological deficits. Extremities: Symmetric 5 x 5 power. Skin: No rashes, lesions or ulcers Psychiatry: Judgement and insight appear normal. Mood & affect appropriate.   Data Reviewed: I have personally reviewed following labs and imaging studies  CBC: Recent Labs  Lab 01/29/21 1723 01/30/21 0358  WBC 21.6* 22.2*  NEUTROABS 9.0*  --   HGB 13.3 12.0*  HCT 40.3 36.8*  MCV 92.0 92.5  PLT 163 144*     Basic Metabolic Panel: Recent Labs  Lab 01/26/21 1520 01/29/21 1723 01/30/21 0358  NA 134* 134* 134*  K 4.5 4.8 4.9  CL 101 99 101  CO2 _0 GLUCOSE 286* 263* 326*  BUN 37* 43* 46*  CREATININE 2.00* 2.03* 2.18*  CALCIUM 8.8 8.8* 8.9    GFR: Estimated Creatinine Clearance: 27.5 mL/min (A) (by C-G formula based on SCr of 2.18 mg/dL (H)).  Liver Function Tests: Recent Labs  Lab 01/29/21 1723 01/30/21 0358  AST 21 22  ALT 28 28  ALKPHOS 93 87  BILITOT 0.1* 0.4  PROT 6.2* 5.8*  ALBUMIN 3.3* 3.1*    CBG: Recent Labs  Lab 01/29/21 2318 01/30/21 0738 01/30/21 1034 01/30/21 1257  GLUCAP 375* 149* 503* 226*  Recent Results (from the past 240 hour(s))  Resp Panel by RT-PCR (Flu A&B, Covid) Nasopharyngeal Swab     Status: None   Collection Time: 01/29/21  7:20 PM   Specimen: Nasopharyngeal Swab; Nasopharyngeal(NP) swabs in vial transport medium  Result Value Ref Range Status   SARS Coronavirus 2 by RT PCR NEGATIVE NEGATIVE Final    Comment: (NOTE) SARS-CoV-2 target nucleic acids are NOT DETECTED.  The SARS-CoV-2 RNA is generally detectable in upper respiratory specimens during the acute phase of infection. The lowest concentration of SARS-CoV-2 viral copies this assay can detect is 138 copies/mL. A negative result does not preclude SARS-Cov-2 infection and should not be used as the sole basis for treatment or other patient management decisions. A negative result may occur with  improper specimen collection/handling, submission of specimen other than nasopharyngeal swab, presence of viral mutation(s) within the areas targeted by this assay, and inadequate number of viral copies(<138 copies/mL). A negative result must be combined with clinical observations, patient history, and epidemiological information. The expected result is Negative.  Fact Sheet for Patients:  EntrepreneurPulse.com.au  Fact Sheet for Healthcare Providers:   IncredibleEmployment.be  This test is no t yet approved or cleared by the [place] FDA and  has been authorized for detection and/or diagnosis of SARS-CoV-2 by FDA under an Emergency Use Authorization (EUA). This EUA will remain  in effect (meaning this test can be used) for the duration of the COVID-19 declaration under Section 564(b)(1) of the Act, 21 U.S.C.section 360bbb-3(b)(1), unless the authorization is terminated  or revoked sooner.       Influenza A by PCR NEGATIVE NEGATIVE Final   Influenza B by PCR NEGATIVE NEGATIVE Final    Comment: (NOTE) The Xpert Xpress SARS-CoV-2/FLU/RSV plus assay is intended as an aid in the diagnosis of influenza from Nasopharyngeal swab specimens and should not be used as a sole basis for treatment. Nasal washings and aspirates are unacceptable for Xpert Xpress SARS-CoV-2/FLU/RSV testing.  Fact Sheet for Patients: EntrepreneurPulse.com.au  Fact Sheet for Healthcare Providers: IncredibleEmployment.be  This test is not yet approved or cleared by the [place] FDA and has been authorized for detection and/or diagnosis of SARS-CoV-2 by FDA under an Emergency Use Authorization (EUA). This EUA will remain in effect (meaning this test can be used) for the duration of the COVID-19 declaration under Section 564(b)(1) of the Act, 21 U.S.C. section 360bbb-3(b)(1), unless the authorization is terminated or revoked.  Performed at [facility], [address], [place], [place] [address]      Radiology Studies: NM Pulmonary Perfusion  Result Date: 01/30/2021 CLINICAL DATA:  Dyspnea concern for pulmonary embolism. Positive D-dimer EXAM: NUCLEAR MEDICINE PERFUSION LUNG SCAN TECHNIQUE: Perfusion images were obtained in multiple projections after intravenous injection of radiopharmaceutical. RADIOPHARMACEUTICALS:  4.3 mCi Tc-29mMAA COMPARISON:  V/Q (perfusion only) scan 09/01/2020, chest  radiograph same day FINDINGS: There is a new large peripheral wedge-shaped perfusion defect within the anterior aspect of the RIGHT lower lobe present on the RPO projection. Small wedge-shaped defect within the RIGHT middle lobe is similar to comparison perfusion exam. New posterosuperior peripheral defect within the LEFT upper lobe additionally. IMPRESSION: New wedge-shaped peripheral perfusion defects (RIGHT lower lobe and LEFT upper lobe) consistent with acute pulmonary emboli. Electronically Signed   By: SSuzy BouchardM.D.   On: 01/30/2021 13:11   Portable chest 1 View  Result Date: 01/30/2021 CLINICAL DATA:  Dyspnea EXAM: PORTABLE CHEST 1 VIEW COMPARISON:  Yesterday FINDINGS: Pulmonary fibrosis with honeycombing by CT.  Low volume chest. Cardiomegaly which is chronic and accentuated by mediastinal fat. Increased indistinct density over the right chest. Artifact from EKG leads. IMPRESSION: 1. Question interval pneumonic infiltrate on the right. 2. Pulmonary fibrosis. Electronically Signed   By: Jorje Guild M.D.   On: 01/30/2021 06:08   DG Chest Port 1 View  Result Date: 01/29/2021 CLINICAL DATA:  Increased shortness of breath. History of pulmonary fibrosis. EXAM: PORTABLE CHEST 1 VIEW COMPARISON:  01/26/2021. FINDINGS: Stable heart size and mediastinal contours. Unchanged elevation of right hemidiaphragm. Extensive interstitial thickening consistent with provided history of pulmonary fibrosis, not significantly changed. No superimposed airspace disease. No pneumothorax or pleural effusion. Stable osseous structures. IMPRESSION: Extensive chronic interstitial thickening consistent with provided history of pulmonary fibrosis. No superimposed acute radiographic abnormality. Electronically Signed   By: Keith Rake M.D.   On: 01/29/2021 18:22    Scheduled Meds:  allopurinol  300 mg Oral Daily   citalopram  40 mg Oral Daily   enoxaparin (LOVENOX) injection  80 mg Subcutaneous Q24H    fluconazole  100 mg Oral Daily   insulin aspart  0-20 Units Subcutaneous TID WC   insulin aspart  0-5 Units Subcutaneous QHS   insulin aspart  6 Units Subcutaneous TID WC   ipratropium-albuterol  3 mL Nebulization Q6H   loratadine  10 mg Oral Daily   metoprolol tartrate  2.5 mg Intravenous Once   nystatin  5 mL Mouth/Throat QID   pantoprazole  40 mg Oral Q0600   pramipexole  0.25 mg Oral BID   predniSONE  40 mg Oral Daily   silver sulfADIAZINE   Topical Daily   Continuous Infusions:   LOS: 0 days   Time spent: 55 mins   Jeffrey Neels Wynetta Emery, MD How to contact the [facility] Attending or Consulting provider [place] or covering provider during after hours [place], for this patient?  Check the care team in [facility] and look for a) attending/consulting [facility] provider listed and b) the [facility] team listed Log into [email or web address] and use [place]'s universal password to access. If you do not have the password, please contact the hospital operator. Locate the [facility] provider you are looking for under [facility] and page to a number that you can be directly reached. If you still have difficulty reaching the provider, please page the [facility] (Director on Call) for the Hospitalists listed on amion for assistance.  01/30/2021, 3:47 PM

## 2021-01-30 NOTE — ED Notes (Signed)
Patient tends to remove his oxygen frequently when moving around in his chair. Saturation drops into 80's.

## 2021-01-30 NOTE — Progress Notes (Addendum)
ANTICOAGULATION CONSULT NOTE - Initial Consult  Pharmacy Consult for enoxaparin  Indication:  VTE treatment   Allergies  Allergen Reactions   Benadryl Allergy [Diphenhydramine Hcl]     Does not know what it does to him   Reglan [Metoclopramide] Other (See Comments)    Makes me crazy    Doxycycline Rash    Patient Measurements:   Heparin Dosing Weight:     Vital Signs: Temp: 98.7 F (37.1 C) (10/30 0600) Temp Source: Oral (10/30 0600) BP: 132/83 (10/30 1300) Pulse Rate: 98 (10/30 1300)  Labs: Recent Labs    01/29/21 1723 01/30/21 0358  HGB 13.3 12.0*  HCT 40.3 36.8*  PLT 163 144*  CREATININE 2.03* 2.18*    Estimated Creatinine Clearance: 27.5 mL/min (A) (by C-G formula based on SCr of 2.18 mg/dL (H)).   Medical History: Past Medical History:  Diagnosis Date   Chronic renal insufficiency    CLL (chronic lymphocytic leukemia) (HCC)    Diabetes (HCC)    Gout    Hypertension     Medications:  (Not in a hospital admission)  Scheduled:   allopurinol  300 mg Oral Daily   citalopram  40 mg Oral Daily   enoxaparin (LOVENOX) injection  75 mg Subcutaneous Q24H   fluconazole  100 mg Oral Daily   insulin aspart  0-20 Units Subcutaneous TID WC   insulin aspart  0-5 Units Subcutaneous QHS   insulin aspart  6 Units Subcutaneous TID WC   ipratropium-albuterol  3 mL Nebulization Q6H   loratadine  10 mg Oral Daily   metoprolol tartrate  2.5 mg Intravenous Once   nystatin  5 mL Mouth/Throat QID   pantoprazole  40 mg Oral Q0600   pramipexole  0.25 mg Oral BID   predniSONE  40 mg Oral Daily   silver sulfADIAZINE   Topical Daily   Infusions:  PRN: acetaminophen **OR** acetaminophen, albuterol, fluticasone, morphine injection, ondansetron **OR** ondansetron (ZOFRAN) IV, oxyCODONE, traZODone Anti-infectives (From admission, onward)    Start     Dose/Rate Route Frequency Ordered Stop   01/30/21 1000  fluconazole (DIFLUCAN) tablet 150 mg  Status:  Discontinued         150 mg Oral Daily 01/30/21 0743 01/30/21 0827   01/30/21 1000  fluconazole (DIFLUCAN) tablet 100 mg        100 mg Oral Daily 01/30/21 0827         Assessment: Jeffrey Brennan a 80 y.o. male requires anticoagulation with a heparin iv infusion for the indication of   VTE Treatment . Enoxaparin will be started following pharmacy protocol per pharmacy consult.   Goal of Therapy:  Anti-Xa level 0.6-1 units/ml 4hrs after LMWH dose given Monitor platelets by anticoagulation protocol: Yes   Plan:  Stop subq UFH Lovenox 11m/kg subq q24h  CBC MWF    GDonna ChristenCoffee 01/30/2021,1:37 PM

## 2021-01-31 ENCOUNTER — Encounter (HOSPITAL_COMMUNITY): Payer: Self-pay | Admitting: Family Medicine

## 2021-01-31 ENCOUNTER — Inpatient Hospital Stay (HOSPITAL_COMMUNITY): Payer: PPO

## 2021-01-31 ENCOUNTER — Encounter (HOSPITAL_COMMUNITY): Payer: PPO

## 2021-01-31 DIAGNOSIS — J9621 Acute and chronic respiratory failure with hypoxia: Secondary | ICD-10-CM

## 2021-01-31 DIAGNOSIS — J8410 Pulmonary fibrosis, unspecified: Secondary | ICD-10-CM | POA: Diagnosis not present

## 2021-01-31 DIAGNOSIS — Z515 Encounter for palliative care: Secondary | ICD-10-CM

## 2021-01-31 DIAGNOSIS — R7989 Other specified abnormal findings of blood chemistry: Secondary | ICD-10-CM

## 2021-01-31 DIAGNOSIS — R0602 Shortness of breath: Secondary | ICD-10-CM | POA: Diagnosis not present

## 2021-01-31 DIAGNOSIS — I2699 Other pulmonary embolism without acute cor pulmonale: Secondary | ICD-10-CM

## 2021-01-31 DIAGNOSIS — Z7189 Other specified counseling: Secondary | ICD-10-CM

## 2021-01-31 LAB — GLUCOSE, CAPILLARY
Glucose-Capillary: 71 mg/dL (ref 70–99)
Glucose-Capillary: 121 mg/dL — ABNORMAL HIGH (ref 70–99)
Glucose-Capillary: 107 mg/dL — ABNORMAL HIGH (ref 70–99)
Glucose-Capillary: 222 mg/dL — ABNORMAL HIGH (ref 70–99)

## 2021-01-31 LAB — ECHOCARDIOGRAM LIMITED
Height: 69 in
S' Lateral: 2.3 cm
Weight: 2603.19 oz

## 2021-01-31 LAB — BASIC METABOLIC PANEL
Anion gap: 9 (ref 5–15)
BUN: 52 mg/dL — ABNORMAL HIGH (ref 8–23)
CO2: 25 mmol/L (ref 22–32)
Calcium: 8.9 mg/dL (ref 8.9–10.3)
Chloride: 102 mmol/L (ref 98–111)
Creatinine, Ser: 2.12 mg/dL — ABNORMAL HIGH (ref 0.61–1.24)
GFR, Estimated: 31 mL/min — ABNORMAL LOW (ref >60)
Glucose, Bld: 68 mg/dL — ABNORMAL LOW (ref 70–99)
Potassium: 4.4 mmol/L (ref 3.5–5.1)
Sodium: 136 mmol/L (ref 135–145)

## 2021-01-31 LAB — CBC
HCT: 34.7 % — ABNORMAL LOW (ref 39.0–52.0)
Hemoglobin: 11.4 g/dL — ABNORMAL LOW (ref 13.0–17.0)
MCH: 30.6 pg (ref 26.0–34.0)
MCHC: 32.9 g/dL (ref 30.0–36.0)
MCV: 93 fL (ref 80.0–100.0)
Platelets: 157 10*3/uL (ref 150–400)
RBC: 3.73 MIL/uL — ABNORMAL LOW (ref 4.22–5.81)
RDW: 16.2 % — ABNORMAL HIGH (ref 11.5–15.5)
WBC: 23.5 10*3/uL — ABNORMAL HIGH (ref 4.0–10.5)
nRBC: 0 % (ref 0.0–0.2)

## 2021-01-31 LAB — MAGNESIUM: Magnesium: 2.6 mg/dL — ABNORMAL HIGH (ref 1.7–2.4)

## 2021-01-31 LAB — MRSA NEXT GEN BY PCR, NASAL: MRSA by PCR Next Gen: DETECTED — AB

## 2021-01-31 MED ORDER — MUPIROCIN 2 % EX OINT
TOPICAL_OINTMENT | Freq: Two times a day (BID) | CUTANEOUS | Status: DC
  Administered 2021-01-31: 1 via NASAL
  Filled 2021-01-31: qty 22

## 2021-01-31 MED ORDER — INSULIN ASPART 100 UNIT/ML IJ SOLN
4.0000 [IU] | Freq: Once | INTRAMUSCULAR | Status: AC
  Administered 2021-01-31: 4 [IU] via SUBCUTANEOUS

## 2021-01-31 MED ORDER — MORPHINE SULFATE 10 MG/5ML PO SOLN: 2.6000 mg | ORAL | Status: DC | PRN

## 2021-01-31 MED ORDER — CITALOPRAM HYDROBROMIDE 20 MG PO TABS
20.0000 mg | ORAL_TABLET | Freq: Every day | ORAL | Status: DC
  Administered 2021-02-01 – 2021-02-02 (×2): 20 mg via ORAL
  Filled 2021-01-31 (×2): qty 1

## 2021-01-31 MED ORDER — INSULIN ASPART 100 UNIT/ML IJ SOLN
2.0000 [IU] | Freq: Three times a day (TID) | INTRAMUSCULAR | Status: DC
  Administered 2021-01-31 – 2021-02-02 (×5): 2 [IU] via SUBCUTANEOUS

## 2021-01-31 MED ORDER — PREDNISONE 20 MG PO TABS
20.0000 mg | ORAL_TABLET | Freq: Every day | ORAL | Status: DC
  Administered 2021-01-31 – 2021-02-02 (×3): 20 mg via ORAL
  Filled 2021-01-31 (×3): qty 1

## 2021-01-31 MED ORDER — INSULIN ASPART 100 UNIT/ML IJ SOLN
0.0000 [IU] | Freq: Three times a day (TID) | INTRAMUSCULAR | Status: DC
  Administered 2021-01-31: 1 [IU] via SUBCUTANEOUS
  Administered 2021-02-01: 5 [IU] via SUBCUTANEOUS
  Administered 2021-02-01: 3 [IU] via SUBCUTANEOUS
  Administered 2021-02-02: 2 [IU] via SUBCUTANEOUS

## 2021-01-31 MED ORDER — OXYMETAZOLINE HCL 0.05 % NA SOLN
3.0000 | Freq: Two times a day (BID) | NASAL | Status: DC | PRN
  Filled 2021-01-31: qty 15

## 2021-01-31 MED ORDER — MORPHINE SULFATE (CONCENTRATE) 10 MG/0.5ML PO SOLN
2.6000 mg | ORAL | Status: DC | PRN
  Administered 2021-02-02: 2.6 mg via ORAL
  Filled 2021-01-31: qty 0.5

## 2021-01-31 NOTE — Progress Notes (Signed)
PROGRESS NOTE   Jeffrey Brennan  JME:[id number] DOB: 06/28/1940 DOA: 01/29/2021 PCP: [name], PA   Chief Complaint  Patient presents with   Shortness of Breath   Level of care: Stepdown  Brief Admission History:   80 y.o. male, with history of chronic renal insufficiency, CLL, diabetes type 2, hypertension, pulmonary fibrosis, and more presents ED with a CC of acute dyspnea.   Patient reports that he is woke up suddenly out of sleep at 4 AM due to worsening dyspnea.  He reports he was able to tripod to get the dyspnea to improve, and he was back to sleep.  Again it happened at 6 AM.  He reports that his dyspnea is coming in waves.  It is especially worse if he has any minimal exertion.  Is also worse if he lays on back.  Patient reports that he has had acute onset of lower extremity peripheral edema for which he is seeing an outpatient physician.  They increased his Lasix from 40 mg daily to 40 mg twice daily, patient reports no improvement.  Patient reports that he has not had much urine output either.  Recently saw [facility].  Their note reveals that patient has not been able to tolerate antifibrotics.  Not a candidate for clinical trials given his CLL.  He was being seen for worsening dyspnea.  He did have proBNP which was mildly elevated at 895.  BNP here was 91.   I also did a D-dimer which came back elevated at 1.65.  Patient was scheduled to have a VQ scan on Monday.  Patient's last echo was in September and showed ejection fraction of 60 to 65% with indeterminate diastolic parameters.  It is worth noting that he just finished a 89-monthcourse of Eliquis for pulmonary embolism.  In June 2022 he had a small segmental defect on his VQ scan.  He had bouts of epistaxis on Eliquis, even when adjusted down to 2.5 mg patient was in the ER for epistaxis and Dr. MWonda Amishad him to stop taking in mid Oct 2022.    Assessment & Plan:   Active Problems:   Diabetes 1.5, managed as type 2  (HCC)   Pulmonary fibrosis (HCC)   Elevated d-dimer   Pressure ulcer   AK[place]) [place]   Oral candidiasis   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) (HCC)   Acute and chronic respiratory failure with hypoxia (HCC)  Acute pulmonary emboli  - VQ scan done 10/30 with findings of a new wedge-shaped peripheral perfusion defects in the right lower lobe and left upper lobe consistent with acute pulmonary emboli. -Unfortunately this presents a challenge given the patient did not tolerate apixaban well and had several bouts of epistaxis. -I discussed with him and his daughter-in-law at the bedside the risks and possible benefits of anticoagulation and at this time he wants to proceed with trial of anticoagulation.  We will start him on enoxaparin.  Will ask for hematology recommendation.   - Pt understands and verbalized understanding of risks of severe bleeding complication.  We also discussed IVC filter.  Pt wants to try anticoagulation again.  - check limited 2D Echo for right heart strain.  - palliative care consultation for goals of care requested   DVT right saphenofemoral junction and superficial thrombophlebitis  - anticoagulation with enoxaparin - TED hoses ordered for compression    Acute on chronic respiratory failure  - exacerbated by acute pulmonary emboli - pt very  SOB at this time and with his pulmonary fibrosis will ask his pulmonary team to see him as well.  - continue supplemental oxygen. Pt reports he is Do Not Intubate.  - discuss further goals of care with palliative team as he continues to decline.    AKI on CKD stage 2 - recent worsening of renal function in last few months  - he reports not responding to oral lasix (tried as outpatient 20-40 mg p.o.) - trial of IV lasix  - follow daily renal function panel   Severe acute peripheral edema  - he recently developed 2+ edema in Bilateral lower extremities - check for deep venous thrombosis  given acute PE - TED hoses ordered - IV lasix ordered with good results   Pulmonary Fibrosis  - Pt is steroid dependent which has been continued - He remains very symptomatically short of breath  - He reports quality of life seriously affected - consult to his pulmonologist  - consult to palliative care for goals of care discussions   Oral candidiasis  - from daily steroids - continue fluconazole therapy  Type 2 diabetes mellitus  - continue SSI coverage and frequent CBG monitoring  CBG (last 3)  Recent Labs    01/30/21 2126 01/31/21 0724 01/31/21 1112  GLUCAP 99 71 121*    CLL - labs stable  - no s/s of infection   Stage 1 sacral pressure wound - wound care as ordered.   DVT prophylaxis: enoxaparin  Code Status: DNI  Family Communication: daughter-in-law, son at bedside  Disposition: TBD  Status is: Inpatient  Remains inpatient appropriate because: requiring IV treatments    Consultants:  Pulm Cardio Hem/onc   Procedures:  VQ scan 10/30   Antimicrobials:  N/a   Subjective: Pt reports less SOB today and no epistaxis so far.  Less edema in legs.     Objective: Vitals:   01/31/21 1000 01/31/21 1100 01/31/21 1113 01/31/21 1200  BP: (!) 103/56 114/71  101/71  Pulse: 87  92   Resp: (!) 23  20   Temp:   97.7 F (36.5 C)   TempSrc:   Oral   SpO2: 99%  99%   Weight:      Height:        Intake/Output Summary (Last 24 hours) at 01/31/2021 1315 Last data filed at 01/31/2021 0620 Gross per 24 hour  Intake 200 ml  Output 1380 ml  Net -1180 ml   Filed Weights   01/30/21 1715  Weight: 73.8 kg    Examination:  General exam: chronically ill appearing male, Appears calm and comfortable  Respiratory system: Clear to auscultation. Respiratory effort normal. Cardiovascular system: normal S1 & S2 heard. No JVD, murmurs, rubs, gallops or clicks. No pedal edema. Gastrointestinal system: Abdomen is nondistended, soft and nontender. No organomegaly or  masses felt. Normal bowel sounds heard. Central nervous system: Alert and oriented. No focal neurological deficits. Extremities: TED hoses to BLEs.  No pretibial edema.   Skin: No rashes, lesions or ulcers Psychiatry: Judgement and insight appear normal. Mood & affect appropriate.   Data Reviewed: I have personally reviewed following labs and imaging studies  CBC: Recent Labs  Lab 01/29/21 1723 01/30/21 0358 01/31/21 0319  WBC 21.6* 22.2* 23.5*  NEUTROABS 9.0*  --   --   HGB 13.3 12.0* 11.4*  HCT 40.3 36.8* 34.7*  MCV 92.0 92.5 93.0  PLT 163 144* 834    Basic Metabolic Panel: Recent Labs  Lab 01/26/21 1520 01/29/21  1723 01/30/21 0358 01/31/21 0319  NA 134* 134* 134* 136  K 4.5 4.8 4.9 4.4  CL 101 99 101 102  CO2 _0 GLUCOSE 286* 263* 326* 68*  BUN 37* 43* 46* 52*  CREATININE 2.00* 2.03* 2.18* 2.12*  CALCIUM 8.8 8.8* 8.9 8.9  MG  --   --   --  2.6*    GFR: Estimated Creatinine Clearance: 28.3 mL/min (A) (by C-G formula based on SCr of 2.12 mg/dL (H)).  Liver Function Tests: Recent Labs  Lab 01/29/21 1723 01/30/21 0358  AST 21 22  ALT 28 28  ALKPHOS 93 87  BILITOT 0.1* 0.4  PROT 6.2* 5.8*  ALBUMIN 3.3* 3.1*    CBG: Recent Labs  Lab 01/30/21 1706 01/30/21 1835 01/30/21 2126 01/31/21 0724 01/31/21 1112  GLUCAP 59* 82 99 71 121*    Recent Results (from the past 240 hour(s))  Resp Panel by RT-PCR (Flu A&B, Covid) Nasopharyngeal Swab     Status: None   Collection Time: 01/29/21  7:20 PM   Specimen: Nasopharyngeal Swab; Nasopharyngeal(NP) swabs in vial transport medium  Result Value Ref Range Status   SARS Coronavirus 2 by RT PCR NEGATIVE NEGATIVE Final    Comment: (NOTE) SARS-CoV-2 target nucleic acids are NOT DETECTED.  The SARS-CoV-2 RNA is generally detectable in upper respiratory specimens during the acute phase of infection. The lowest concentration of SARS-CoV-2 viral copies this assay can detect is 138 copies/mL. A negative  result does not preclude SARS-Cov-2 infection and should not be used as the sole basis for treatment or other patient management decisions. A negative result may occur with  improper specimen collection/handling, submission of specimen other than nasopharyngeal swab, presence of viral mutation(s) within the areas targeted by this assay, and inadequate number of viral copies(<138 copies/mL). A negative result must be combined with clinical observations, patient history, and epidemiological information. The expected result is Negative.  Fact Sheet for Patients:  EntrepreneurPulse.com.au  Fact Sheet for Healthcare Providers:  IncredibleEmployment.be  This test is no t yet approved or cleared by the [place] FDA and  has been authorized for detection and/or diagnosis of SARS-CoV-2 by FDA under an Emergency Use Authorization (EUA). This EUA will remain  in effect (meaning this test can be used) for the duration of the COVID-19 declaration under Section 564(b)(1) of the Act, 21 U.S.C.section 360bbb-3(b)(1), unless the authorization is terminated  or revoked sooner.       Influenza A by PCR NEGATIVE NEGATIVE Final   Influenza B by PCR NEGATIVE NEGATIVE Final    Comment: (NOTE) The Xpert Xpress SARS-CoV-2/FLU/RSV plus assay is intended as an aid in the diagnosis of influenza from Nasopharyngeal swab specimens and should not be used as a sole basis for treatment. Nasal washings and aspirates are unacceptable for Xpert Xpress SARS-CoV-2/FLU/RSV testing.  Fact Sheet for Patients: EntrepreneurPulse.com.au  Fact Sheet for Healthcare Providers: IncredibleEmployment.be  This test is not yet approved or cleared by the [place] FDA and has been authorized for detection and/or diagnosis of SARS-CoV-2 by FDA under an Emergency Use Authorization (EUA). This EUA will remain in effect (meaning this test can be used)  for the duration of the COVID-19 declaration under Section 564(b)(1) of the Act, 21 U.S.C. section 360bbb-3(b)(1), unless the authorization is terminated or revoked.  Performed at [facility], [address] [place], [place] [address]   MRSA Next Gen by PCR, Nasal     Status: Abnormal   Collection Time: 01/30/21  5:02 PM   Specimen: Nasal Mucosa; Nasal Swab  Result Value Ref Range Status   MRSA by PCR Next Gen DETECTED (A) NOT DETECTED Final    Comment: RESULT CALLED TO, READ BACK BY AND VERIFIED WITH:  A.[place] @ 1126 01/31/21 BY STEPHTR (NOTE) The GeneXpert MRSA Assay (FDA approved for NASAL specimens only), is one component of a comprehensive MRSA colonization surveillance program. It is not intended to diagnose MRSA infection nor to guide or monitor treatment for MRSA infections. Test performance is not FDA approved in patients less than 17 years old. Performed at [facility], [address], [place], [place] [address]      Radiology Studies: NM Pulmonary Perfusion  Result Date: 01/30/2021 CLINICAL DATA:  Dyspnea concern for pulmonary embolism. Positive D-dimer EXAM: NUCLEAR MEDICINE PERFUSION LUNG SCAN TECHNIQUE: Perfusion images were obtained in multiple projections after intravenous injection of radiopharmaceutical. RADIOPHARMACEUTICALS:  4.3 mCi Tc-34mMAA COMPARISON:  V/Q (perfusion only) scan 09/01/2020, chest radiograph same day FINDINGS: There is a new large peripheral wedge-shaped perfusion defect within the anterior aspect of the RIGHT lower lobe present on the RPO projection. Small wedge-shaped defect within the RIGHT middle lobe is similar to comparison perfusion exam. New posterosuperior peripheral defect within the LEFT upper lobe additionally. IMPRESSION: New wedge-shaped peripheral perfusion defects (RIGHT lower lobe and LEFT upper lobe) consistent with acute pulmonary emboli. Electronically Signed   By: SSuzy BouchardM.D.   On: 01/30/2021 13:11   UK[place]Venous Img  Lower Bilateral (DVT)  Result Date: 01/31/2021 CLINICAL DATA:  History of pulmonary embolus. EXAM: BILATERAL LOWER EXTREMITY VENOUS DOPPLER ULTRASOUND TECHNIQUE: Gray-scale sonography with graded compression, as well as color Doppler and duplex ultrasound were performed to evaluate the lower extremity deep venous systems from the level of the common femoral vein and including the common femoral, femoral, profunda femoral, popliteal and calf veins including the posterior tibial, peroneal and gastrocnemius veins when visible. The superficial great saphenous vein was also interrogated. Spectral Doppler was utilized to evaluate flow at rest and with distal augmentation maneuvers in the common femoral, femoral and popliteal veins. COMPARISON:  None. FINDINGS: RIGHT LOWER EXTREMITY Common Femoral Vein: No evidence of thrombus. Normal compressibility, respiratory phasicity and response to augmentation. Saphenofemoral Junction: Nonocclusive thrombus is noted. Profunda Femoral Vein: No evidence of thrombus. Normal compressibility and flow on color Doppler imaging. Femoral Vein: No evidence of thrombus. Normal compressibility, respiratory phasicity and response to augmentation. Popliteal Vein: No evidence of thrombus. Normal compressibility, respiratory phasicity and response to augmentation. Calf Veins: No evidence of thrombus. Normal compressibility and flow on color Doppler imaging. Superficial Great Saphenous Vein: Noncompressible consistent with occlusive thrombus and superficial thrombophlebitis. Venous Reflux:  None. Other Findings:  None. LEFT LOWER EXTREMITY Common Femoral Vein: No evidence of thrombus. Normal compressibility, respiratory phasicity and response to augmentation. Saphenofemoral Junction: No evidence of thrombus. Normal compressibility and flow on color Doppler imaging. Profunda Femoral Vein: No evidence of thrombus. Normal compressibility and flow on color Doppler imaging. Femoral Vein: No evidence  of thrombus. Normal compressibility, respiratory phasicity and response to augmentation. Popliteal Vein: No evidence of thrombus. Normal compressibility, respiratory phasicity and response to augmentation. Calf Veins: No evidence of thrombus. Normal compressibility and flow on color Doppler imaging. Superficial Great Saphenous Vein: Occlusive thrombus is noted. Venous Reflux:  None. Other Findings:  Baker's cyst is seen in left popliteal fossa. IMPRESSION: Nonocclusive deep venous thrombus is seen involving the right saphenofemoral junction. Superficial thrombophlebitis is seen involving bilateral greater saphenous veins. Electronically  Signed   By: Marijo Conception M.D.   On: 01/31/2021 12:00   Portable chest 1 View  Result Date: 01/30/2021 CLINICAL DATA:  Dyspnea EXAM: PORTABLE CHEST 1 VIEW COMPARISON:  Yesterday FINDINGS: Pulmonary fibrosis with honeycombing by CT. Low volume chest. Cardiomegaly which is chronic and accentuated by mediastinal fat. Increased indistinct density over the right chest. Artifact from EKG leads. IMPRESSION: 1. Question interval pneumonic infiltrate on the right. 2. Pulmonary fibrosis. Electronically Signed   By: Jorje Guild M.D.   On: 01/30/2021 06:08   DG Chest Port 1 View  Result Date: 01/29/2021 CLINICAL DATA:  Increased shortness of breath. History of pulmonary fibrosis. EXAM: PORTABLE CHEST 1 VIEW COMPARISON:  01/26/2021. FINDINGS: Stable heart size and mediastinal contours. Unchanged elevation of right hemidiaphragm. Extensive interstitial thickening consistent with provided history of pulmonary fibrosis, not significantly changed. No superimposed airspace disease. No pneumothorax or pleural effusion. Stable osseous structures. IMPRESSION: Extensive chronic interstitial thickening consistent with provided history of pulmonary fibrosis. No superimposed acute radiographic abnormality. Electronically Signed   By: Keith Rake M.D.   On: 01/29/2021 18:22     Scheduled Meds:  allopurinol  300 mg Oral Daily   Chlorhexidine Gluconate Cloth  6 each Topical Daily   citalopram  40 mg Oral Daily   enoxaparin (LOVENOX) injection  80 mg Subcutaneous Q24H   fluconazole  100 mg Oral Daily   insulin aspart  0-9 Units Subcutaneous TID WC   insulin aspart  2 Units Subcutaneous TID WC   ipratropium-albuterol  3 mL Nebulization TID   loratadine  10 mg Oral Daily   mouth rinse  15 mL Mouth Rinse BID   metoprolol tartrate  2.5 mg Intravenous Once   mupirocin ointment   Nasal BID   nystatin  5 mL Mouth/Throat QID   pantoprazole  40 mg Oral Q0600   pramipexole  0.25 mg Oral BID   predniSONE  20 mg Oral Daily   silver sulfADIAZINE   Topical Daily   Continuous Infusions:   LOS: 1 day   Time spent:40 mins   Irwin Brakeman, MD How to contact the [facility] Attending or Consulting provider [place] or covering provider during after hours [place], for this patient?  Check the care team in [facility] and look for a) attending/consulting TRH provider listed and b) the [facility] team listed Log into [email or web address] and use [place]'s universal password to access. If you do not have the password, please contact the hospital operator. Locate the [facility] provider you are looking for under [facility] and page to a number that you can be directly reached. If you still have difficulty reaching the provider, please page the [facility] (Director on Call) for the Hospitalists listed on amion for assistance.  01/31/2021, 1:15 PM

## 2021-01-31 NOTE — TOC Initial Note (Signed)
Transition of Care [facility]) - Initial/Assessment Note    Patient Details  Name: Jeffrey Brennan MRN: [id number] Date of Birth: 1940/04/21  Transition of Care [facility]) CM/SW Contact:    Iona Beard, [place] Phone Number: 01/31/2021, 1:30 PM  Clinical Narrative:                 Pt is high risk for readmission. CSW spoke with pt to complete assessment. Pt states that he lives alone. Pt states that he is normally independent in completing ADLs however has recently needed more assistance. Pt states his son and daughter in law can assist if needed. Pt states he is able to drive. Pt states that he has not had HH but does have a palliative RN coming into the home to see him. Pt states that he does not have any DME in the home. TOC to follow.   Expected Discharge Plan: Home/Self Care Barriers to Discharge: Continued Medical Work up   Patient Goals and CMS Choice Patient states their goals for this hospitalization and ongoing recovery are:: return home CMS Medicare.gov Compare Post Acute Care list provided to:: Patient Choice offered to / list presented to : Patient  Expected Discharge Plan and Services Expected Discharge Plan: Home/Self Care In-house Referral: Clinical Social Work Discharge Planning Services: CM Consult   Living arrangements for the past 2 months: Single Family Home                                      Prior Living Arrangements/Services Living arrangements for the past 2 months: Single Family Home Lives with:: Self Patient language and need for interpreter reviewed:: Yes Do you feel safe going back to the place where you live?: Yes      Need for Family Participation in Patient Care: Yes (Comment) Care giver support system in place?: Yes (comment) Current home services: Home RN Criminal Activity/Legal Involvement Pertinent to Current Situation/Hospitalization: No - Comment as needed  Activities of Daily Living Home Assistive Devices/Equipment: Other (Comment)  (Ramp into house) ADL Screening (condition at time of admission) Patient's cognitive ability adequate to safely complete daily activities?: Yes Is the patient deaf or have difficulty hearing?: No Does the patient have difficulty seeing, even when wearing glasses/contacts?: No Does the patient have difficulty concentrating, remembering, or making decisions?: No Patient able to express need for assistance with ADLs?: Yes Does the patient have difficulty dressing or bathing?: No Independently performs ADLs?: Yes (appropriate for developmental age) Does the patient have difficulty walking or climbing stairs?: Yes Weakness of Legs: Both Weakness of Arms/Hands: Both  Permission Sought/Granted                  Emotional Assessment Appearance:: Appears stated age Attitude/Demeanor/Rapport: Engaged Affect (typically observed): Accepting Orientation: : Oriented to Self, Oriented to Place, Oriented to  Time, Oriented to Situation Alcohol / Substance Use: Not Applicable Psych Involvement: No (comment)  Admission diagnosis:  Pulmonary fibrosis (HCC) [J84.10] 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) (HCC) [J96.20] Dyspnea [R06.00] Dyspnea on exertion [R06.09] Acute pulmonary embolism (HCC) [I26.99] Acute and chronic respiratory failure with hypoxia (HCC) [J96.21] Patient Active Problem List   Diagnosis Date Noted   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) ([place]) 01/30/2021   Acute and chronic respiratory failure with hypoxia ([place]) 01/30/2021   Pulmonary fibrosis ([place]) 01/29/2021   Elevated d-dimer 01/29/2021   Pressure ulcer 01/29/2021   AKI (acute  kidney injury) ([place]) 01/29/2021   Oral candidiasis 01/29/2021   Stomatitis 12/14/2020   Diabetes mellitus ([place]) 09/07/2020   Gout 09/07/2020   Pulmonary embolism ([place]) 09/07/2020   Chronic respiratory failure with hypoxia ([place]) 06/21/2020   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[place]) 06/21/2020   Sepsis  secondary to CAP with acute hypoxic respiratory failure 06/12/2020   CAP (community acquired pneumonia) 06/11/2020   Acute respiratory failure with hypoxia ([place]) 06/11/2020   History of pulmonary fibrosis 06/11/2020   Essential hypertension 06/11/2020   Hyperglycemia due to diabetes mellitus ([place]) 06/11/2020   CKD (chronic kidney disease), stage III ([place]) 06/11/2020   Leukocytosis 06/11/2020   Hyponatremia 06/11/2020   GERD (gastroesophageal reflux disease) 06/11/2020   Hypoalbuminemia 06/04/2020   Excessive weight loss 12/09/2019   High priority for 2019-nCoV vaccine 12/09/2019   History of right hip hemiarthroplasty 01/08/2018   Iron deficiency anemia due to chronic blood loss 11/21/2017   History of colonic polyps 11/21/2017   Anemia 07/02/2017   Colon polyps 09/09/2014   Localized swelling of both lower legs 07/06/2014   Bilateral leg edema 06/16/2014   Renal insufficiency 06/16/2014   CLL (chronic lymphocytic leukemia) ([place]) 02/27/2013   Diabetes 1.5, managed as type 2 ([place]) 02/27/2013   History of gout 02/27/2013   PCP:  [name], PA Pharmacy:   [facility], [place] [address] [place] [address] Phone: [phone] Fax: [phone]     Social Determinants of Health (SDOH) Interventions    Readmission Risk Interventions Readmission Risk Prevention Plan 01/31/2021 06/14/2020  Post Dischage Appt - Complete  Medication Screening - Complete  Transportation Screening Complete Complete  Home Care Screening Complete -  Medication Review (RN CM) Complete -  Some recent data might be hidden

## 2021-01-31 NOTE — Progress Notes (Signed)
*  PRELIMINARY RESULTS* Echocardiogram 2D Echocardiogram has been performed.  Jeffrey Brennan 01/31/2021, 10:16 AM
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
## 2021-01-31 NOTE — Progress Notes (Signed)
PT Cancellation Note  Patient Details Name: Jeffrey Brennan MRN: [id number] DOB: [birth year]   Cancelled Treatment:    Reason Eval/Treat Not Completed: Medical issues which prohibited therapy.     2:17 PM, 01/31/21 Lonell Grandchild, MPT Physical Therapist with [facility] [phone] office [phone] mobile phone

## 2021-01-31 NOTE — Consult Note (Signed)
Consultation Note Date: 01/31/2021   Patient Name: Jeffrey Brennan  DOB: Aug 23, 1940  MRN: [id number]  Age / Sex: 80 y.o., male  PCP: [name], [place] Referring Physician: Murlean Iba, MD  Reason for Consultation: Establishing goals of care  HPI/Patient Profile: 80 y.o. male  with past medical history of chronic renal insufficiency, CLL, DM2, HTN, pulmonary fibrosis, gout, polypectomy 2019, right hip replacement, followed by outpatient pulmonology, active with outpatient palliative care through hospice of [facility], active with Dominica Severin, recently treated for PE, but did not tolerate anticoagulation due to bleeding even with adjusted low-dose Eliquis admitted on 01/29/2021 with acute PE right lower lobe and left upper lobe, DVT of right saphenofemoral junction, acute on chronic respiratory failure.   Clinical Assessment and Goals of Care: I have reviewed medical records including EPIC notes, labs and imaging, received report from RN, assessed the patient and then met at the bedside along with son, Jeffrey Brennan, to discuss diagnosis prognosis, [place], EOL wishes, disposition and options.   I introduced Palliative Medicine as specialized medical care for people living with serious illness. It focuses on providing relief from the symptoms and stress of a serious illness. The goal is to improve quality of life for both the patient and the family.  Jeffrey Brennan is active with outpatient palliative services through hospice of [facility].  He is followed with Dominica Severin.  He tells me that he was to have a meeting this Wednesday.  We discussed a brief life review of the patient.  Jeffrey Brennan his wife died about 5 years ago.  He has 1 child, son Jeffrey Brennan.  He shares that Jeffrey Brennan and his wife, Jeffrey Brennan who is an Therapist, sports here at this hospital in the Endo suite, are available to help him as needed.   Jeffrey Brennan tells me that he is often not ready for visitors until after 1:00 in the afternoon.  He shares that he must take frequent breaks even while getting dressed.  We talked about his poor mobility.  Jeffrey Brennan has a stage II sacral decubitus.  We then focused on their current illness.  We talk in detail about lower right and upper left PE and the treatment plan including but not limited to anticoagulation, IVC filter.  We also talked about hematology/oncology evaluation.  We also talked about pulmonology evaluation today.  We talked about the difference between palliative care and hospice care.  We talked about disposition to short-term rehab versus home with home health.  The natural disease trajectory and expectations at EOL were discussed.  I attempted to elicit values and goals of care important to the patient.  The difference between aggressive medical intervention and comfort care was considered in light of the patient's goals of care.  At this point Jeffrey Brennan shares that he would like to continue to strive for improvement.  We talked in detail about "it remains to be seen" his ability to recover and to what level.  Advanced directives, concepts specific to code  status, artifical feeding and hydration, and rehospitalization were considered and discussed.  We talked in detail about the realities of CPR, and the benefit of "treat the treatable but allowing natural passing".  Palliative Care services outpatient were explained and offered.  Jeffrey Brennan is active with outpatient palliative services through hospice of [facility].  We talked about the benefits of both palliative care and hospice care.  Son Jeffrey Brennan asks when it would be appropriate to start hospice care.  I share that hospice relies on the believe that each of [place] has the right to die pain-free and with dignity, and that her loved ones receives support.  I share that only Jeffrey Brennan can know when enough is enough, when he is  ready for hospice care.  I share that hospice can continue to "treat the treatable".  Discussed the importance of continued conversation with family and the medical providers regarding overall plan of care and treatment options, ensuring decisions are within the context of the patient's values and GOCs.  Questions and concerns were addressed.   The patient and family was encouraged to call with questions or concerns.  PMT will continue to support holistically.  Conference with attending, bedside nursing staff, transition of care team related to patient condition, needs, goals of care, disposition.   HCPOA  NEXT OF KIN -son Jeffrey Brennan and his wife, Jeffrey Mccleery, RN.  Jeffrey Brennan wife died about 5 years ago.  He has no other children    SUMMARY OF RECOMMENDATIONS   At this point continue to treat the treatable but no intubation Time for outcomes Trial low-dose morphine by mouth for dyspnea At this point not sure that he would except short-term rehab or home health Considering transitioning from palliative to hospice care for added support at home   Code Status/Advance Care Planning: Limited code -at this point no intubation.  We talked about the concept of "treat the treatable, but allowing natural passing".  Symptom Management:  Per hospitalist. We talked about the benefits of by mouth limit dose morphine for dyspnea.  Jeffrey Brennan shares his concern about addiction, but tells me that he is agreeable to try low-dose morphine to help with quality of life.  Discussed with attending and pulmonologist.  Palliative Prophylaxis:  Palliative Wound Care  Additional Recommendations (Limitations, Scope, Preferences): Continue to treat the treatable but no intubation  Psycho-social/Spiritual:  Desire for further Chaplaincy support:no Additional Recommendations: Caregiving  Support/Resources and Education on Hospice  Prognosis:  Unable to determine, based on outcomes.  3 to 6 months or  less would not be surprising based on chronic illness burden, poor functional status, coagulopathy with difficulty on anticoagulation.   Discharge Planning:  To be determined, based on outcomes and choices       Primary Diagnoses: Present on Admission:  Pulmonary fibrosis (HCC)  Elevated d-dimer  Pressure ulcer  AKI (acute kidney injury) ([place])  Diabetes 1.5, managed as type 2 ([place])  Oral candidiasis  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) (HCC)  Acute and chronic respiratory failure with hypoxia ([place])   I have reviewed the medical record, interviewed the patient and family, and examined the patient. The following aspects are pertinent.  Past Medical History:  Diagnosis Date   Chronic renal insufficiency    CLL (chronic lymphocytic leukemia) (HCC)    Diabetes ([place])    Gout    Hypertension    Social History   Socioeconomic History   Marital status: Married    Spouse name: Not  on file   Number of children: Not on file   Years of education: Not on file   Highest education level: Not on file  Occupational History   Not on file  Tobacco Use   Smoking status: Former    Packs/day: 0.25    Years: 3.00    Pack years: 0.75    Types: Cigarettes, Cigars    Quit date: 48    Years since quitting: 52.8   Smokeless tobacco: Never  Substance and Sexual Activity   Alcohol use: Yes   Drug use: Never   Sexual activity: Not on file  Other Topics Concern   Not on file  Social History Narrative   Not on file   Social Determinants of Health   Financial Resource Strain: Not on file  Food Insecurity: Not on file  Transportation Needs: Not on file  Physical Activity: Not on file  Stress: Not on file  Social Connections: Not on file   History reviewed. No pertinent family history. Scheduled Meds:  allopurinol  300 mg Oral Daily   Chlorhexidine Gluconate Cloth  6 each Topical Daily   citalopram  40 mg Oral Daily   enoxaparin (LOVENOX) injection  80 mg  Subcutaneous Q24H   fluconazole  100 mg Oral Daily   insulin aspart  0-9 Units Subcutaneous TID WC   insulin aspart  2 Units Subcutaneous TID WC   ipratropium-albuterol  3 mL Nebulization TID   loratadine  10 mg Oral Daily   mouth rinse  15 mL Mouth Rinse BID   metoprolol tartrate  2.5 mg Intravenous Once   mupirocin ointment   Nasal BID   nystatin  5 mL Mouth/Throat QID   pantoprazole  40 mg Oral Q0600   pramipexole  0.25 mg Oral BID   predniSONE  20 mg Oral Daily   silver sulfADIAZINE   Topical Daily   Continuous Infusions: PRN Meds:.acetaminophen **OR** acetaminophen, albuterol, fluticasone, morphine injection, ondansetron **OR** ondansetron (ZOFRAN) IV, oxyCODONE, sodium chloride, traZODone Medications Prior to Admission:  Prior to Admission medications   Medication Sig Start Date End Date Taking? Authorizing Provider  albuterol (PROVENTIL) (2.5 MG/3ML) 0.083% nebulizer solution INHALE THE contents of ONE vial EVERY 6 HOURS AS NEEDED FOR wheezing OR SHORTNESS OF BREATH 01/17/21  Yes Parrett, Tammy S, NP  albuterol (VENTOLIN HFA) 108 (90 Base) MCG/ACT inhaler INHALE TWO PUFFS EVERY 6 HOURS AS NEEDED FOR wheezing OR SHORTNESS OF BREATH 12/23/20  Yes Parrett, Tammy S, NP  allopurinol (ZYLOPRIM) 300 MG tablet Take 300 mg by mouth daily.   Yes [provider]  citalopram (CELEXA) 40 MG tablet Take 40 mg by mouth daily.   Yes [provider]  fluconazole (DIFLUCAN) 150 MG tablet Take 1 tablet (150 mg total) by mouth daily. 01/26/21  Yes Martyn Ehrich, NP  fluticasone (FLONASE) 50 MCG/ACT nasal spray Place 2 sprays into both nostrils daily.   Yes [provider]  furosemide (LASIX) 20 MG tablet Take 1 tablet (20 mg total) by mouth daily. Patient taking differently: Take 20 mg by mouth See admin instructions. Take 40 mg daily for 3 days starting on 01/28/2021, then then take 20 mg daily 06/14/20  Yes Emokpae, Courage, MD  glimepiride (AMARYL) 2 MG tablet Take 2  mg by mouth every morning. 06/14/20  Yes [provider]  loratadine (CLARITIN) 10 MG tablet Take 10 mg by mouth daily.   Yes [provider]  omeprazole (PRILOSEC) 20 MG capsule Take 20 mg by mouth  every evening.   Yes [provider]  omeprazole (PRILOSEC) 40 MG capsule Take 40 mg by mouth daily.  12/12/17  Yes [provider]  potassium chloride (KLOR-CON) 10 MEQ tablet Take 1 tablet (10 mEq total) by mouth daily. Take While taking Lasix/furosemide 06/14/20  Yes Emokpae, Courage, MD  pramipexole (MIRAPEX) 0.25 MG tablet Take 0.25 mg by mouth 2 (two) times daily. 06/19/20  Yes [provider]  predniSONE (DELTASONE) 20 MG tablet Take 2 tablets (40 mg total) by mouth daily. 01/28/21  Yes Martyn Ehrich, NP  apixaban (ELIQUIS) 2.5 MG TABS tablet Take 1 tablet (2.5 mg total) by mouth 2 (two) times daily. Patient not taking: No sig reported 12/13/20   Parrett, Fonnie Mu, NP  benzonatate (TESSALON) 200 MG capsule Take 1 capsule (200 mg total) by mouth 3 (three) times daily as needed for cough. Patient not taking: No sig reported 08/13/20   Parrett, Fonnie Mu, NP  Crisaborole (EUCRISA) 2 % OINT Apply 1 application topically as needed. 08/30/18   [provider]  halobetasol (ULTRAVATE) 0.05 % ointment SMARTSIG:2.5 Gram(s) Topical Twice Daily Patient not taking: No sig reported 07/17/20   [provider]  nystatin (MYCOSTATIN) 100000 UNIT/ML suspension Take 100,000 Units by mouth. Patient not taking: No sig reported 01/11/21   [provider]  predniSONE (DELTASONE) 10 MG tablet TAKE 1 TABLET BY MOUTH DAILY WITH BREAKFAST Patient not taking: No sig reported 10/18/20   Parrett, Tammy S, NP  predniSONE (DELTASONE) 10 MG tablet TAKE 1 OR 2 TABLETS BY MOUTH EVERY DAY AS DIRECTED Patient not taking: No sig reported 01/25/21   Parrett, Tammy S, NP  sulfamethoxazole-trimethoprim (BACTRIM DS) 800-160 MG tablet Take 1 tablet by mouth 3 (three)  times a week. Patient not taking: No sig reported 12/13/20   Parrett, Fonnie Mu, NP  triamcinolone (KENALOG) 0.1 % paste Apply dental paste to mouth ulcer twice daily as needed Patient not taking: No sig reported 12/13/20   Parrett, Fonnie Mu, NP   Allergies  Allergen Reactions   Benadryl Allergy [Diphenhydramine Hcl]     Does not know what it does to him   Reglan [Metoclopramide] Other (See Comments)    Makes me crazy    Doxycycline Rash   Review of Systems  Unable to perform ROS: Age   Physical Exam Vitals and nursing note reviewed.  Constitutional:      General: He is not in acute distress.    Appearance: He is normal weight. He is ill-appearing.  HENT:     Head: Normocephalic and atraumatic.  Cardiovascular:     Rate and Rhythm: Normal rate.  Pulmonary:     Effort: Pulmonary effort is normal.  Musculoskeletal:     Right lower leg: Edema present.     Left lower leg: No edema.  Skin:    General: Skin is warm and dry.  Neurological:     Mental Status: He is alert and oriented to person, place, and time.  Psychiatric:        Mood and Affect: Mood normal.        Behavior: Behavior normal.    Vital Signs: BP 101/71   Pulse 92   Temp 97.7 F (36.5 C) (Oral)   Resp 20   Ht _0  (1.753 m)   Wt 73.8 kg   SpO2 97%   BMI 24.03 kg/m  Pain Scale: 0-10 POSS *See Group Information*: 1-Acceptable,Awake and alert Pain Score: 0-No pain   SpO2: SpO2: 97 %  O2 Device:SpO2: 97 % O2 Flow Rate: .O2 Flow Rate (L/min): 3 L/min  IO: Intake/output summary:  Intake/Output Summary (Last 24 hours) at 01/31/2021 1336 Last data filed at 01/31/2021 0620 Gross per 24 hour  Intake 200 ml  Output 1380 ml  Net -1180 ml    LBM: Last BM Date: 01/28/21 Baseline Weight: Weight: 73.8 kg Most recent weight: Weight: 73.8 kg     Palliative Assessment/Data:   Flowsheet Rows    Flowsheet Row Most Recent Value  Intake Tab   Referral Department Hospitalist  Unit at Time of Referral  Intermediate Care Unit  Palliative Care Primary Diagnosis Pulmonary  Date Notified 01/30/21  Palliative Care Type New Palliative care  Reason for referral Clarify Goals of Care  Date of Admission 01/29/21  Date first seen by Palliative Care 01/31/21  # of days Palliative referral response time 1 Day(s)  # of days IP prior to Palliative referral 1  Clinical Assessment   Palliative Performance Scale Score 40%  Pain Max last 24 hours Not able to report  Pain Min Last 24 hours Not able to report  Dyspnea Max Last 24 Hours Not able to report  Dyspnea Min Last 24 hours Not able to report  Psychosocial & Spiritual Assessment   Palliative Care Outcomes        Time In: 0920 Time Out: 1030 Time Total: 70 minutes  Greater than 50%  of this time was spent counseling and coordinating care related to the above assessment and plan.  Signed by: Drue Novel, NP   Please contact Palliative Medicine Team phone at [phone] for questions and concerns.  For individual provider: See Shea Evans

## 2021-01-31 NOTE — Progress Notes (Addendum)
01/31/2021 1:40 PM  Update:  RN informed me that patient is having epistaxis.  Unfortunately we need to stop the enoxaparin now.  Pt may not have any other alternatives for anticoagulation at this point.  He had been treated by ENT when he was having epistaxis on apixaban.  Will wait for hem/onc recommendations.  DC enoxparin for now.   Murvin Natal, MD

## 2021-02-01 ENCOUNTER — Ambulatory Visit: Payer: PPO | Admitting: Internal Medicine

## 2021-02-01 DIAGNOSIS — I2699 Other pulmonary embolism without acute cor pulmonale: Principal | ICD-10-CM

## 2021-02-01 DIAGNOSIS — C9110 Chronic lymphocytic leukemia of B-cell type not having achieved remission: Secondary | ICD-10-CM

## 2021-02-01 DIAGNOSIS — R040 Epistaxis: Secondary | ICD-10-CM

## 2021-02-01 DIAGNOSIS — R0600 Dyspnea, unspecified: Secondary | ICD-10-CM

## 2021-02-01 LAB — BASIC METABOLIC PANEL
Anion gap: 6 (ref 5–15)
BUN: 51 mg/dL — ABNORMAL HIGH (ref 8–23)
CO2: 27 mmol/L (ref 22–32)
Calcium: 9 mg/dL (ref 8.9–10.3)
Chloride: 105 mmol/L (ref 98–111)
Creatinine, Ser: 2.04 mg/dL — ABNORMAL HIGH (ref 0.61–1.24)
GFR, Estimated: 33 mL/min — ABNORMAL LOW (ref >60)
Glucose, Bld: 77 mg/dL (ref 70–99)
Potassium: 4.6 mmol/L (ref 3.5–5.1)
Sodium: 138 mmol/L (ref 135–145)

## 2021-02-01 LAB — GLUCOSE, CAPILLARY
Glucose-Capillary: 71 mg/dL (ref 70–99)
Glucose-Capillary: 210 mg/dL — ABNORMAL HIGH (ref 70–99)
Glucose-Capillary: 295 mg/dL — ABNORMAL HIGH (ref 70–99)
Glucose-Capillary: 226 mg/dL — ABNORMAL HIGH (ref 70–99)

## 2021-02-01 LAB — MAGNESIUM: Magnesium: 2.5 mg/dL — ABNORMAL HIGH (ref 1.7–2.4)

## 2021-02-01 MED ORDER — IPRATROPIUM-ALBUTEROL 0.5-2.5 (3) MG/3ML IN SOLN
3.0000 mL | Freq: Two times a day (BID) | RESPIRATORY_TRACT | Status: DC
  Administered 2021-02-01 – 2021-02-02 (×2): 3 mL via RESPIRATORY_TRACT
  Filled 2021-02-01 (×2): qty 3

## 2021-02-01 MED ORDER — ENOXAPARIN SODIUM 80 MG/0.8ML IJ SOSY
1.0000 mg/kg | PREFILLED_SYRINGE | INTRAMUSCULAR | Status: DC
  Administered 2021-02-01: 75 mg via SUBCUTANEOUS
  Filled 2021-02-01 (×2): qty 0.8
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
## 2021-02-01 NOTE — Progress Notes (Signed)
PROGRESS NOTE   Jeffrey Brennan  OYD:[id number] DOB: 1940/12/12 DOA: 01/29/2021 PCP: [name], PA   Chief Complaint  Patient presents with   Shortness of Breath   Level of care: Stepdown  Brief Admission History:   80 y.o. male, with history of chronic renal insufficiency, CLL, diabetes type 2, hypertension, pulmonary fibrosis, and more presents ED with a CC of acute dyspnea.   Patient reports that he is woke up suddenly out of sleep at 4 AM due to worsening dyspnea.  He reports he was able to tripod to get the dyspnea to improve, and he was back to sleep.  Again it happened at 6 AM.  He reports that his dyspnea is coming in waves.  It is especially worse if he has any minimal exertion.  Is also worse if he lays on back.  Patient reports that he has had acute onset of lower extremity peripheral edema for which he is seeing an outpatient physician.  They increased his Lasix from 40 mg daily to 40 mg twice daily, patient reports no improvement.  Patient reports that he has not had much urine output either.  Recently saw [facility].  Their note reveals that patient has not been able to tolerate antifibrotics.  Not a candidate for clinical trials given his CLL.  He was being seen for worsening dyspnea.  He did have proBNP which was mildly elevated at 895.  BNP here was 91.   I also did a D-dimer which came back elevated at 1.65.  Patient was scheduled to have a VQ scan on Monday.  Patient's last echo was in September and showed ejection fraction of 60 to 65% with indeterminate diastolic parameters.  It is worth noting that he just finished a 42-monthcourse of Eliquis for pulmonary embolism.  In June 2022 he had a small segmental defect on his VQ scan.  He had bouts of epistaxis on Eliquis, even when adjusted down to 2.5 mg patient was in the ER for epistaxis and Dr. MWonda Amishad him to stop taking in mid Oct 2022.    Assessment & Plan:   Active Problems:   Diabetes 1.5, managed as type 2  (HCC)   Pulmonary fibrosis (HCC)   Elevated d-dimer   Pressure ulcer   AK[place] kidney injury) ([place]   Oral candidiasis   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) (HCC)   Acute and chronic respiratory failure with hypoxia (HCC)  Acute pulmonary emboli  - VQ scan done 10/30 with findings of a new wedge-shaped peripheral perfusion defects in the right lower lobe and left upper lobe consistent with acute pulmonary emboli. -Unfortunately this presents a challenge given the patient did not tolerate apixaban well and had several bouts of epistaxis. -I discussed with him and his daughter-in-law at the bedside the risks and possible benefits of anticoagulation and at this time he wants to proceed with trial of anticoagulation.  We started him on enoxaparin injections and he developed epistaxis after the first injection and it was discontinued.  Now patient is considering IVC filter but wants to discuss with his family and trusted vascular surgeon.  Will ask for hematology recommendation.   - Pt understands and verbalized understanding of risks of severe bleeding complication.  We also discussed IVC filter.  Pt wants to try anticoagulation again.  - check limited 2D Echo for right heart strain.  - palliative care consultation for goals of care requested   DVT right saphenofemoral junction and superficial  thrombophlebitis  - anticoagulation with enoxaparin stopped due to epistaxis.  Pt discussing with family about restarting versus IVC filter  - TED hoses ordered for compression    Acute on chronic respiratory failure  - exacerbated by acute pulmonary emboli - pt very SOB at this time and with his pulmonary fibrosis will ask his pulmonary team to see him as well.  - continue supplemental oxygen. Pt reports he is Do Not Intubate.  - discuss further goals of care with palliative team as he continues to decline.    AKI on CKD stage 2 - recent worsening of renal function in last few  months  - he reports not responding to oral lasix (tried as outpatient 20-40 mg p.o.) - trial of IV lasix completed.  - follow daily renal function panel   Severe acute peripheral edema  - he recently developed 2+ edema in Bilateral lower extremities - positive deep venous thrombosis right saphofemoral junction and superficial thrombophlebitis - TED hoses ordered - IV lasix given with good results   Pulmonary Fibrosis  - Pt is steroid dependent which has been continued - He remains very symptomatically short of breath  - He reports quality of life seriously affected - consult to his pulmonologist  - consult to palliative care for goals of care discussions   Oral candidiasis  - from daily steroids - continue fluconazole therapy  Type 2 diabetes mellitus  - continue SSI coverage and frequent CBG monitoring  CBG (last 3)  Recent Labs    01/31/21 2231 02/01/21 0707 02/01/21 1146  GLUCAP 222* 71 210*    CLL - labs stable WBC 24  - no s/s of infection  - hem/onc will consult and weigh in   Stage 1 sacral pressure wound - wound care as ordered.   DVT prophylaxis: enoxaparin  Code Status: DNI  Family Communication: daughter-in-law, son at bedside  Disposition: TBD  Status is: Inpatient  Remains inpatient appropriate because: requiring IV treatments    Consultants:  Pulm Cardio Hem/onc   Procedures:  VQ scan 10/30  Venous duplex 10/30   Antimicrobials:  N/a   Subjective: Pt says he is having more SOB today and having palpitations.  No chest pain.  He wants to ambulate more but afraid of blood clot in the leg.       Objective: Vitals:   02/01/21 0713 02/01/21 0800 02/01/21 0806 02/01/21 1100  BP:  (!) 79/66    Pulse: 87 (!) 107    Resp: (!) 21 (!) 26    Temp: 98.8 F (37.1 C)   98.5 F (36.9 C)  TempSrc: Oral   Oral  SpO2: 98% (!) 84% 96%   Weight:      Height:        Intake/Output Summary (Last 24 hours) at 02/01/2021 1335 Last data filed at  02/01/2021 0900 Gross per 24 hour  Intake 460 ml  Output 2700 ml  Net -2240 ml   Filed Weights   01/30/21 1715  Weight: 73.8 kg    Examination:  General exam: chronically ill appearing male, Appears calm and comfortable  Respiratory system: Clear to auscultation. Respiratory effort normal. Cardiovascular system: normal S1 & S2 heard. No JVD, murmurs, rubs, gallops or clicks. No pedal edema. Gastrointestinal system: Abdomen is nondistended, soft and nontender. No organomegaly or masses felt. Normal bowel sounds heard. Central nervous system: Alert and oriented. No focal neurological deficits. Extremities: TED hoses to BLEs.  No pretibial edema.   Skin: No rashes, lesions  or ulcers Psychiatry: Judgement and insight appear normal. Mood & affect appropriate.   Data Reviewed: I have personally reviewed following labs and imaging studies  CBC: Recent Labs  Lab 01/29/21 1723 01/30/21 0358 01/31/21 0319  WBC 21.6* 22.2* 23.5*  NEUTROABS 9.0*  --   --   HGB 13.3 12.0* 11.4*  HCT 40.3 36.8* 34.7*  MCV 92.0 92.5 93.0  PLT 163 144* 709    Basic Metabolic Panel: Recent Labs  Lab 01/26/21 1520 01/29/21 1723 01/30/21 0358 01/31/21 0319 02/01/21 0532  NA 134* 134* 134* 136 138  K 4.5 4.8 4.9 4.4 4.6  CL 101 99 101 102 105  CO2 _0 GLUCOSE 286* 263* 326* 68* 77  BUN 37* 43* 46* 52* 51*  CREATININE 2.00* 2.03* 2.18* 2.12* 2.04*  CALCIUM 8.8 8.8* 8.9 8.9 9.0  MG  --   --   --  2.6* 2.5*    GFR: Estimated Creatinine Clearance: 29.4 mL/min (A) (by C-G formula based on SCr of 2.04 mg/dL (H)).  Liver Function Tests: Recent Labs  Lab 01/29/21 1723 01/30/21 0358  AST 21 22  ALT 28 28  ALKPHOS 93 87  BILITOT 0.1* 0.4  PROT 6.2* 5.8*  ALBUMIN 3.3* 3.1*    CBG: Recent Labs  Lab 01/31/21 1112 01/31/21 1628 01/31/21 2231 02/01/21 0707 02/01/21 1146  GLUCAP 121* 107* 222* 71 210*    Recent Results (from the past 240 hour(s))  Resp Panel by RT-PCR (Flu  A&B, Covid) Nasopharyngeal Swab     Status: None   Collection Time: 01/29/21  7:20 PM   Specimen: Nasopharyngeal Swab; Nasopharyngeal(NP) swabs in vial transport medium  Result Value Ref Range Status   SARS Coronavirus 2 by RT PCR NEGATIVE NEGATIVE Final    Comment: (NOTE) SARS-CoV-2 target nucleic acids are NOT DETECTED.  The SARS-CoV-2 RNA is generally detectable in upper respiratory specimens during the acute phase of infection. The lowest concentration of SARS-CoV-2 viral copies this assay can detect is 138 copies/mL. A negative result does not preclude SARS-Cov-2 infection and should not be used as the sole basis for treatment or other patient management decisions. A negative result may occur with  improper specimen collection/handling, submission of specimen other than nasopharyngeal swab, presence of viral mutation(s) within the areas targeted by this assay, and inadequate number of viral copies(<138 copies/mL). A negative result must be combined with clinical observations, patient history, and epidemiological information. The expected result is Negative.  Fact Sheet for Patients:  EntrepreneurPulse.com.au  Fact Sheet for Healthcare Providers:  IncredibleEmployment.be  This test is no t yet approved or cleared by the [place] FDA and  has been authorized for detection and/or diagnosis of SARS-CoV-2 by FDA under an Emergency Use Authorization (EUA). This EUA will remain  in effect (meaning this test can be used) for the duration of the COVID-19 declaration under Section 564(b)(1) of the Act, 21 U.S.C.section 360bbb-3(b)(1), unless the authorization is terminated  or revoked sooner.       Influenza A by PCR NEGATIVE NEGATIVE Final   Influenza B by PCR NEGATIVE NEGATIVE Final    Comment: (NOTE) The Xpert Xpress SARS-CoV-2/FLU/RSV plus assay is intended as an aid in the diagnosis of influenza from Nasopharyngeal swab specimens  and should not be used as a sole basis for treatment. Nasal washings and aspirates are unacceptable for Xpert Xpress SARS-CoV-2/FLU/RSV testing.  Fact Sheet for Patients: EntrepreneurPulse.com.au  Fact Sheet for Healthcare Providers: IncredibleEmployment.be  This test is not  yet approved or cleared by the [place] and has been authorized for detection and/or diagnosis of SARS-CoV-2 by FDA under an Emergency Use Authorization (EUA). This EUA will remain in effect (meaning this test can be used) for the duration of the COVID-19 declaration under Section 564(b)(1) of the Act, 21 U.S.C. section 360bbb-3(b)(1), unless the authorization is terminated or revoked.  Performed at [facility], [address], [place], [place] [address]   MRSA Next Gen by PCR, Nasal     Status: Abnormal   Collection Time: 01/30/21  5:02 PM   Specimen: Nasal Mucosa; Nasal Swab  Result Value Ref Range Status   MRSA by PCR Next Gen DETECTED (A) NOT DETECTED Final    Comment: RESULT CALLED TO, READ BACK BY AND VERIFIED WITH:  A.[place] @ 1126 01/31/21 BY STEPHTR (NOTE) The GeneXpert MRSA Assay (FDA approved for NASAL specimens only), is one component of a comprehensive MRSA colonization surveillance program. It is not intended to diagnose MRSA infection nor to guide or monitor treatment for MRSA infections. Test performance is not FDA approved in patients less than 66 years old. Performed at [facility], [address] [place], [place] [address]      Radiology Studies: US Venous Img Lower Bilateral (DVT)  Result Date: 01/31/2021 CLINICAL DATA:  History of pulmonary embolus. EXAM: BILATERAL LOWER EXTREMITY VENOUS DOPPLER ULTRASOUND TECHNIQUE: Gray-scale sonography with graded compression, as well as color Doppler and duplex ultrasound were performed to evaluate the lower extremity deep venous systems from the level of the common femoral vein and including the  common femoral, femoral, profunda femoral, popliteal and calf veins including the posterior tibial, peroneal and gastrocnemius veins when visible. The superficial great saphenous vein was also interrogated. Spectral Doppler was utilized to evaluate flow at rest and with distal augmentation maneuvers in the common femoral, femoral and popliteal veins. COMPARISON:  None. FINDINGS: RIGHT LOWER EXTREMITY Common Femoral Vein: No evidence of thrombus. Normal compressibility, respiratory phasicity and response to augmentation. Saphenofemoral Junction: Nonocclusive thrombus is noted. Profunda Femoral Vein: No evidence of thrombus. Normal compressibility and flow on color Doppler imaging. Femoral Vein: No evidence of thrombus. Normal compressibility, respiratory phasicity and response to augmentation. Popliteal Vein: No evidence of thrombus. Normal compressibility, respiratory phasicity and response to augmentation. Calf Veins: No evidence of thrombus. Normal compressibility and flow on color Doppler imaging. Superficial Great Saphenous Vein: Noncompressible consistent with occlusive thrombus and superficial thrombophlebitis. Venous Reflux:  None. Other Findings:  None. LEFT LOWER EXTREMITY Common Femoral Vein: No evidence of thrombus. Normal compressibility, respiratory phasicity and response to augmentation. Saphenofemoral Junction: No evidence of thrombus. Normal compressibility and flow on color Doppler imaging. Profunda Femoral Vein: No evidence of thrombus. Normal compressibility and flow on color Doppler imaging. Femoral Vein: No evidence of thrombus. Normal compressibility, respiratory phasicity and response to augmentation. Popliteal Vein: No evidence of thrombus. Normal compressibility, respiratory phasicity and response to augmentation. Calf Veins: No evidence of thrombus. Normal compressibility and flow on color Doppler imaging. Superficial Great Saphenous Vein: Occlusive thrombus is noted. Venous Reflux:  None.  Other Findings:  Baker's cyst is seen in left popliteal fossa. IMPRESSION: Nonocclusive deep venous thrombus is seen involving the right saphenofemoral junction. Superficial thrombophlebitis is seen involving bilateral greater saphenous veins. Electronically Signed   By: Marijo Conception M.D.   On: 01/31/2021 12:00   ECHOCARDIOGRAM LIMITED  Result Date: 01/31/2021    ECHOCARDIOGRAM LIMITED REPORT   Patient Name:   Jeffrey Brennan Date of Exam:  01/31/2021 Medical Rec #:  [id number]       Height:       69.0 in Accession #:    [id number]      Weight:       162.7 lb Date of Birth:  July 05, 1940        BSA:          1.892 m Patient Age:    2 years        BP:           121/71 mmHg Patient Gender: M               HR:           93 bpm. Exam Location:  [facility] Procedure: Limited Echo Indications:    Pulmonary Embolus  History:        Patient has prior history of Echocardiogram examinations, most                 recent 12/07/2020. Risk Factors:Hypertension and Diabetes.                 Respiratory failure.  Sonographer:    Wenda Low Referring Phys: [place]  1. Limited study without Doppler.  2. Left ventricular ejection fraction, by estimation, is 60 to 65%. The left ventricle has normal function. The left ventricle has no regional wall motion abnormalities. There is mild left ventricular hypertrophy.  3. Right ventricular systolic function is mildly reduced. The right ventricular size is normal.  4. The aortic valve is tricuspid. Mild to moderate aortic valve sclerosis/calcification is present, without any evidence of aortic stenosis. Comparison(s): Prior images reviewed side by side. RV contraction is mildly reduced. FINDINGS  Left Ventricle: Left ventricular ejection fraction, by estimation, is 60 to 65%. The left ventricle has normal function. The left ventricle has no regional wall motion abnormalities. The left ventricular internal cavity size was normal in size. There is  mild left  ventricular hypertrophy. Right Ventricle: The right ventricular size is normal. Right ventricular systolic function is mildly reduced. Pericardium: There is no evidence of pericardial effusion. Aortic Valve: The aortic valve is tricuspid. There is mild aortic valve annular calcification. Mild to moderate aortic valve sclerosis/calcification is present, without any evidence of aortic stenosis. Pulmonic Valve: The pulmonic valve was grossly normal. Aorta: The aortic root is normal in size and structure. LEFT VENTRICLE PLAX 2D LVIDd:         4.20 cm LVIDs:         2.30 cm LV PW:         1.20 cm LV IVS:        1.20 cm LVOT diam:     2.00 cm LVOT Area:     3.14 cm  LEFT ATRIUM         Index LA diam:    3.80 cm 2.01 cm/m   AORTA Ao Root diam: 3.70 cm  SHUNTS Systemic Diam: 2.00 cm Rozann Lesches MD Electronically signed by Rozann Lesches MD Signature Date/Time: 01/31/2021/2:04:15 PM    Final     Scheduled Meds:  allopurinol  300 mg Oral Daily   Chlorhexidine Gluconate Cloth  6 each Topical Daily   citalopram  20 mg Oral Daily   fluconazole  100 mg Oral Daily   insulin aspart  0-9 Units Subcutaneous TID WC   insulin aspart  2 Units Subcutaneous TID WC   ipratropium-albuterol  3 mL Nebulization BID   loratadine  10 mg  Oral Daily   mouth rinse  15 mL Mouth Rinse BID   metoprolol tartrate  2.5 mg Intravenous Once   mupirocin ointment   Nasal BID   nystatin  5 mL Mouth/Throat QID   pantoprazole  40 mg Oral Q0600   pramipexole  0.25 mg Oral BID   predniSONE  20 mg Oral Daily   silver sulfADIAZINE   Topical Daily   Continuous Infusions:   LOS: 2 days   Time spent: 36 mins   Caryle Helgeson Wynetta Emery, MD How to contact the [facility] Attending or Consulting provider [place] or covering provider during after hours [place], for this patient?  Check the care team in [facility] and look for a) attending/consulting [facility] provider listed and b) the [facility] team listed Log into [email or web address] and use [place]'s universal password  to access. If you do not have the password, please contact the hospital operator. Locate the [facility] provider you are looking for under [facility] and page to a number that you can be directly reached. If you still have difficulty reaching the provider, please page the [facility] (Director on Call) for the Hospitalists listed on amion for assistance.  02/01/2021, 1:35 PM

## 2021-02-01 NOTE — Progress Notes (Signed)
ANTICOAGULATION CONSULT NOTE - Initial Consult  Pharmacy Consult for Lovenox Indication: pulmonary embolus and DVT  Allergies  Allergen Reactions   Benadryl Allergy [Diphenhydramine Hcl]     Does not know what it does to him   Reglan [Metoclopramide] Other (See Comments)    Makes me crazy    Doxycycline Rash    Patient Measurements: Height: 5' 9" (175.3 cm) Weight: 73.8 kg (162 lb 11.2 oz) IBW/kg (Calculated) : 70.7  Vital Signs: Temp: 98.1 F (36.7 C) (11/01 1500) Temp Source: Oral (11/01 1500) BP: 123/68 (11/01 1600) Pulse Rate: 111 (11/01 1700)  Labs: Recent Labs    01/29/21 1935 01/30/21 0358 01/31/21 0319 02/01/21 0532  HGB  --  12.0* 11.4*  --   HCT  --  36.8* 34.7*  --   PLT  --  144* 157  --   CREATININE  --  2.18* 2.12* 2.04*  TROPONINIHS 23*  --   --   --     Estimated Creatinine Clearance: 29.4 mL/min (A) (by C-G formula based on SCr of 2.04 mg/dL (H)).   Medical History: Past Medical History:  Diagnosis Date   Chronic renal insufficiency    CLL (chronic lymphocytic leukemia) (HCC)    Diabetes (HCC)    Gout    Hypertension     Medications:  Scheduled:   allopurinol  300 mg Oral Daily   Chlorhexidine Gluconate Cloth  6 each Topical Daily   citalopram  20 mg Oral Daily   enoxaparin (LOVENOX) injection  1 mg/kg Subcutaneous Q24H   fluconazole  100 mg Oral Daily   insulin aspart  0-9 Units Subcutaneous TID WC   insulin aspart  2 Units Subcutaneous TID WC   ipratropium-albuterol  3 mL Nebulization BID   loratadine  10 mg Oral Daily   mouth rinse  15 mL Mouth Rinse BID   metoprolol tartrate  2.5 mg Intravenous Once   mupirocin ointment   Nasal BID   nystatin  5 mL Mouth/Throat QID   pantoprazole  40 mg Oral Q0600   pramipexole  0.25 mg Oral BID   predniSONE  20 mg Oral Daily   silver sulfADIAZINE   Topical Daily    Assessment: 74 YOM who presented on 10/29 and found to have acute PE/DVT. Of noting the patient had a recent PE in June  2022 and was treated with Apixaban - full dose then reduced for epistaxis and completed in Oct 2022. This admission the patient was started on full dose Lovenox and received 10/30-10/31 before developing epistaxis and holding. Now the patient is willing to re-attempt and monitor for bleeding.  Last dose of lovenox was 80 mg on 10/31 @ 1330, the MD has already ordered Lovenox 75 mg to start this evening, >24h since the last dose so okay to resume.   Goal of Therapy:  Anti-Xa level 0.6-1 units/ml 4hrs after LMWH dose given Monitor platelets by anticoagulation protocol: Yes   Plan:  - Resume Lovenox 75 mg SQ every 24 hours - Will monitor closely for re-bleeding with re-initiation  Thank you for allowing pharmacy to be a part of this patient's care.  Alycia Rossetti, PharmD, BCPS Clinical Pharmacist 02/01/2021 7:38 PM   **Pharmacist phone directory can now be found on [email or web address] ([email or web address]).  Listed under [place].

## 2021-02-01 NOTE — Plan of Care (Signed)
Resuming weight-based Lovenox SQ tonight.   Problem: Education: Goal: Knowledge of General Education information will improve Description: Including pain rating scale, medication(s)/side effects and non-pharmacologic comfort measures Outcome: Progressing   Problem: Health Behavior/Discharge Planning: Goal: Ability to manage health-related needs will improve Outcome: Progressing   Problem: Clinical Measurements: Goal: Ability to maintain clinical measurements within normal limits will improve Outcome: Progressing Goal: Will remain free from infection Outcome: Progressing Goal: Diagnostic test results will improve Outcome: Progressing Goal: Respiratory complications will improve Outcome: Progressing Goal: Cardiovascular complication will be avoided Outcome: Progressing   Problem: Activity: Goal: Risk for activity intolerance will decrease Outcome: Progressing   Problem: Nutrition: Goal: Adequate nutrition will be maintained Outcome: Progressing   Problem: Coping: Goal: Level of anxiety will decrease Outcome: Progressing   Problem: Elimination: Goal: Will not experience complications related to bowel motility Outcome: Progressing Goal: Will not experience complications related to urinary retention Outcome: Progressing   Problem: Pain Managment: Goal: General experience of comfort will improve Outcome: Progressing   Problem: Safety: Goal: Ability to remain free from injury will improve Outcome: Progressing   Problem: Skin Integrity: Goal: Risk for impaired skin integrity will decrease Outcome: Progressing

## 2021-02-01 NOTE — Progress Notes (Signed)
Palliative: Jeffrey Brennan is lying quietly in bed.  He appears chronically ill and somewhat frail.  He greets me, making and mostly keeping eye contact.  He is alert and oriented, able to make his basic needs known.  His son, Jeffrey Brennan, is at bedside.  As we are finishing our conversation, his daughter-in-law Jeffrey Brennan, arrives.  We talked about Jeffrey Brennan acute health concerns including, but not limited to, his trial of anticoagulation yesterday which led to nosebleeding.  At this point Jeffrey Brennan is reluctant to restart/try further anticoagulation.  He tells me that Jeffrey Brennan was talking to a trusted vascular surgeon about the benefits and risk of IVC filter.  We talked about his oncological visits.  He tells me he has been under surveillance with the cancer center and [facility], seeing them every 6 months.  He is unable to tell us his last appointment, but shares that he now remembers he was told that his "numbers were high" after his last visit and he was supposed to call for follow-up but forgot.  He tells [place] that his numbers were [phone].  I share that our hematologist is to see him today to offer suggestions.  Angie shares that after speaking with trusted vascular surgeon, they are considering IVC filter placement.  Jeffrey Brennan asks about recovery time and limitations afterwards.  I encouraged him to live his best life, doing things that give him pleasure as he is able.  Conference with attending, bedside nursing staff, transition of care team related to patient condition, needs, goals of care, disposition.  Plan:   At this point continue to treat the treatable, but no intubation.  No further CODE STATUS discussions held today.  PMT to continue CODE STATUS discussions.  Active with outpatient palliative services with hospice of [facility].  60 minutes Quinn Axe, NP Palliative medicine team Team phone [phone] Greater than 50% of this time was spent counseling and coordinating care related to the  above assessment and plan.

## 2021-02-02 LAB — GLUCOSE, CAPILLARY
Glucose-Capillary: 168 mg/dL — ABNORMAL HIGH (ref 70–99)
Glucose-Capillary: 100 mg/dL — ABNORMAL HIGH (ref 70–99)
Glucose-Capillary: 192 mg/dL — ABNORMAL HIGH (ref 70–99)

## 2021-02-02 LAB — BASIC METABOLIC PANEL
Anion gap: 4 — ABNORMAL LOW (ref 5–15)
BUN: 41 mg/dL — ABNORMAL HIGH (ref 8–23)
CO2: 28 mmol/L (ref 22–32)
Calcium: 8.8 mg/dL — ABNORMAL LOW (ref 8.9–10.3)
Chloride: 106 mmol/L (ref 98–111)
Creatinine, Ser: 1.77 mg/dL — ABNORMAL HIGH (ref 0.61–1.24)
GFR, Estimated: 39 mL/min — ABNORMAL LOW (ref >60)
Glucose, Bld: 154 mg/dL — ABNORMAL HIGH (ref 70–99)
Potassium: 4.3 mmol/L (ref 3.5–5.1)
Sodium: 138 mmol/L (ref 135–145)

## 2021-02-02 LAB — CBC
HCT: 37.7 % — ABNORMAL LOW (ref 39.0–52.0)
Hemoglobin: 12.1 g/dL — ABNORMAL LOW (ref 13.0–17.0)
MCH: 30.3 pg (ref 26.0–34.0)
MCHC: 32.1 g/dL (ref 30.0–36.0)
MCV: 94.3 fL (ref 80.0–100.0)
Platelets: 143 10*3/uL — ABNORMAL LOW (ref 150–400)
RBC: 4 MIL/uL — ABNORMAL LOW (ref 4.22–5.81)
RDW: 15.9 % — ABNORMAL HIGH (ref 11.5–15.5)
WBC: 16.3 10*3/uL — ABNORMAL HIGH (ref 4.0–10.5)
nRBC: 0.1 % (ref 0.0–0.2)

## 2021-02-02 LAB — MAGNESIUM: Magnesium: 2.3 mg/dL (ref 1.7–2.4)

## 2021-02-02 MED ORDER — TORSEMIDE 20 MG PO TABS: 20.0000 mg | ORAL_TABLET | Freq: Every day | ORAL | 0 refills | Status: AC | PRN

## 2021-02-02 MED ORDER — INSULIN ASPART 100 UNIT/ML IJ SOLN
3.0000 [IU] | Freq: Three times a day (TID) | INTRAMUSCULAR | Status: DC
  Administered 2021-02-02: 3 [IU] via SUBCUTANEOUS

## 2021-02-02 MED ORDER — PREDNISONE 20 MG PO TABS: 20.0000 mg | ORAL_TABLET | Freq: Every day | ORAL | 0 refills | Status: AC

## 2021-02-02 MED ORDER — NYSTATIN 100000 UNIT/ML MT SUSP: 5.0000 mL | Freq: Four times a day (QID) | OROMUCOSAL | 0 refills | Status: AC

## 2021-02-02 MED ORDER — SALINE SPRAY 0.65 % NA SOLN: 1.0000 | NASAL | 0 refills | Status: AC | PRN

## 2021-02-02 MED ORDER — OXYMETAZOLINE HCL 0.05 % NA SOLN: 3.0000 | Freq: Two times a day (BID) | NASAL | 0 refills | Status: AC | PRN

## 2021-02-02 MED ORDER — ENOXAPARIN SODIUM 80 MG/0.8ML IJ SOSY: 75.0000 mg | PREFILLED_SYRINGE | INTRAMUSCULAR | 3 refills | Status: AC

## 2021-02-02 NOTE — Plan of Care (Signed)
  Problem: Acute Rehab PT Goals(only PT should resolve) Goal: Pt Will Go Supine/Side To Sit Outcome: Progressing Flowsheets (Taken 02/02/2021 1220) Pt will go Supine/Side to Sit:  Independently  with modified independence Goal: Patient Will Transfer Sit To/From Stand Outcome: Progressing Flowsheets (Taken 02/02/2021 1220) Patient will transfer sit to/from stand:  with modified independence  with supervision Goal: Pt Will Transfer Bed To Chair/Chair To Bed Outcome: Progressing Flowsheets (Taken 02/02/2021 1220) Pt will Transfer Bed to Chair/Chair to Bed:  with modified independence  with supervision Goal: Pt Will Ambulate Outcome: Progressing Flowsheets (Taken 02/02/2021 1220) Pt will Ambulate:  50 feet  with modified independence  with supervision  with least restrictive assistive device   12:22 PM, 02/02/21 Lonell Grandchild, MPT Physical Therapist with [facility] [phone] office [phone] mobile phone

## 2021-02-02 NOTE — TOC Transition Note (Signed)
Transition of Care [facility]) - CM/SW Discharge Note   Patient Details  Name: Jeffrey Brennan MRN: [id number] Date of Birth: Oct 27, 1940  Transition of Care [facility] CM/SW Contact:  Shade Flood, LCSW Phone Number: 02/02/2021, 12:28 PM   Clinical Narrative:     Pt stable for dc home today per MD. PT recommending HHPT. Met with pt and his son today along with MD. Discussed PT recommendation and also need for Lovenox injections at home. Pt states that he does not want the [facility] for RN or for PT. Offered SNF rehab and pt definitely did not want this option. Pt's son stated that his wife is a Marine scientist and his daughter is in nursing school and he feels that they can help with the injections. Discussed the possibility of a family member staying with pt for a few days to see how he is doing at home upon dc. Pt's son stated that they might have pt come stay at their home until they feel he can be more independent. Pt and his son state that they will work something out.  Arranged Rollator for pt to have prior to dc. Will call pharmacy to confirm co-pays for new prescriptions and update pt.  There are no other TOC needs for dc.   Final next level of care: Home/Self Care Barriers to Discharge: Barriers Resolved   Patient Goals and CMS Choice Patient states their goals for this hospitalization and ongoing recovery are:: return home CMS Medicare.gov Compare Post Acute Care list provided to:: Patient Choice offered to / list presented to : Patient  Discharge Placement                       Discharge Plan and Services In-house Referral: Clinical Social Work Discharge Planning Services: CM Consult            DME Arranged: Gilford Rile rolling with seat DME Agency: [employer] Date DME Agency Contacted: 02/02/21   Representative spoke with at DME Agency: [place] Interventions     Readmission Risk Interventions Readmission Risk Prevention Plan  02/02/2021 01/31/2021 06/14/2020  Post Dischage Appt - - Complete  Medication Screening - - Complete  Transportation Screening Complete Complete Complete  Home Care Screening - Complete -  Medication Review (RN CM) - Complete -  [place] or [place] Patient refused - -  Social Work Consult for [place] Planning/Counseling Complete - -  Palliative Care Screening Not Applicable - -  Medication Review (RN Care Manager) Complete - -  Some recent data might be hidden

## 2021-02-02 NOTE — Progress Notes (Addendum)
Inpatient Diabetes Program Recommendations  AACE/ADA: New Consensus Statement on Inpatient Glycemic Control (2015)  Target Ranges:  Prepandial:   less than 140 mg/dL      Peak postprandial:   less than 180 mg/dL (1-2 hours)      Critically ill patients:  140 - 180 mg/dL   Lab Results  Component Value Date   GLUCAP 100 (H) 02/02/2021   HGBA1C 7.8 (H) 01/30/2021    Review of Glycemic Control Results for Jeffrey Brennan, Jeffrey Brennan (MRN [id number]) as of 02/02/2021 07:44  Ref. 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

## 2021-02-02 NOTE — Discharge Summary (Signed)
Physician Discharge Summary  ANUEL SITTER DSK:[id number] DOB: [birth year] DOA: 01/29/2021  PCP: [name], PA  Admit date: 01/29/2021  Discharge date: 02/02/2021  Admitted From:Home  Disposition:  Home  Recommendations for Outpatient Follow-up:  Follow up with PCP in 1-2 weeks Continue on Lovenox injections daily as prescribed per hematology recommendations Continue other home medications as prior Torsemide as needed for worsening shortness of breath or lower extremity edema Follow-up with ENT in the next 1-2 weeks as recommended.  Home Health:Pt refuses SNF and home health services  Equipment/Devices:Rolling walker/has home O2  Discharge Condition:Stable  CODE STATUS: DNI  Diet recommendation: Heart Healthy  Brief/Interim Summary:  80 y.o. male, with history of chronic renal insufficiency, CLL, diabetes type 2, hypertension, pulmonary fibrosis, and more presents ED with a CC of acute dyspnea.  He was diagnosed with having acute bilateral pulmonary emboli.  He was noted to have complications of epistaxis and was noted to have this previously which complicated his anticoagulation.  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  His 2D echocardiogram did not demonstrate any right heart strain.  He was also noted to have DVT to the right saphenofemoral junction with superficial thrombophlebitis.  Hematology recommended 40m/kg of Lovenox daily which he appears to be tolerating well and will be prescribed to him on discharge.  He otherwise remains on his baseline level of nasal cannula oxygen, but has quick desaturation with any form of exertion.  He was noted to have some mild volume overload on admission as well, but he has diuresed quite well and is stable for discharge.  He will receive torsemide as needed for any worsening issues related to edema or shortness of breath.  PT has assessed him and has recommended home health  services, but he refuses.  Discharge Diagnoses:  Active Problems:   Diabetes 1.5, managed as type 2 (HCC)   Pulmonary fibrosis (HCC)   Elevated d-dimer   Pressure ulcer   AK[place] kidney [place]) [place]   Oral candidiasis   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) (HCC)   Acute and chronic respiratory failure with hypoxia (HCC)   Acute pulmonary embolism (HCC)   Dyspnea   Epistaxis  Principal discharge diagnosis: Acute bilateral pulmonary emboli with DVT to the right saphenofemoral junction.  Discharge Instructions  Discharge Instructions     Diet - low sodium heart healthy   Complete by: As directed    Discharge wound care:   Complete by: As directed    Wound care to sacral Stage 2 pressure injury (POA): cleanse with NS, pat dry. Cover with size appropriate piece of xeroform gauze, top with dry gauze 2x2 and cover with silicone foam bordered dressing for sacrum, oriented with "tip" pointed away from anus.Turn side to side per protocol, minimizing time in the supine position.   Increase activity slowly   Complete by: As directed       Allergies as of 02/02/2021       Reactions   Benadryl Allergy [diphenhydramine Hcl]    Does not know what it does to him   Reglan [metoclopramide] Other (See Comments)   Makes me crazy   Doxycycline Rash        Medication List     STOP taking these medications    apixaban 2.5 MG Tabs tablet Commonly known as: Eliquis   furosemide 20 MG tablet Commonly known as: LASIX   halobetasol 0.05 % ointment Commonly known as: ULTRAVATE  potassium chloride 10 MEQ tablet Commonly known as: KLOR-CON   sulfamethoxazole-trimethoprim 800-160 MG tablet Commonly known as: BACTRIM DS       TAKE these medications    albuterol 108 (90 Base) MCG/ACT inhaler Commonly known as: VENTOLIN HFA INHALE TWO PUFFS EVERY 6 HOURS AS NEEDED FOR wheezing OR SHORTNESS OF BREATH   albuterol (2.5 MG/3ML) 0.083% nebulizer  solution Commonly known as: PROVENTIL INHALE THE contents of ONE vial EVERY 6 HOURS AS NEEDED FOR wheezing OR SHORTNESS OF BREATH   allopurinol 300 MG tablet Commonly known as: ZYLOPRIM Take 300 mg by mouth daily.   benzonatate 200 MG capsule Commonly known as: TESSALON Take 1 capsule (200 mg total) by mouth 3 (three) times daily as needed for cough.   citalopram 40 MG tablet Commonly known as: CELEXA Take 40 mg by mouth daily.   enoxaparin 80 MG/0.8ML injection Commonly known as: LOVENOX Inject 0.75 mLs (75 mg total) into the skin daily.   Eucrisa 2 % Oint Generic drug: Crisaborole Apply 1 application topically as needed.   fluconazole 150 MG tablet Commonly known as: DIFLUCAN Take 1 tablet (150 mg total) by mouth daily.   fluticasone 50 MCG/ACT nasal spray Commonly known as: FLONASE Place 2 sprays into both nostrils daily.   glimepiride 2 MG tablet Commonly known as: AMARYL Take 2 mg by mouth every morning.   loratadine 10 MG tablet Commonly known as: CLARITIN Take 10 mg by mouth daily.   nystatin 100000 UNIT/ML suspension Commonly known as: MYCOSTATIN Use as directed 5 mLs (500,000 Units total) in the mouth or throat 4 (four) times daily. What changed:  how much to take how to take this when to take this   omeprazole 40 MG capsule Commonly known as: PRILOSEC Take 40 mg by mouth daily. What changed: Another medication with the same name was removed. Continue taking this medication, and follow the directions you see here.   oxymetazoline 0.05 % nasal spray Commonly known as: AFRIN Place 3 sprays into both nostrils 2 (two) times daily as needed (epistaxis).   pramipexole 0.25 MG tablet Commonly known as: MIRAPEX Take 0.25 mg by mouth 2 (two) times daily.   predniSONE 20 MG tablet Commonly known as: DELTASONE Take 1 tablet (20 mg total) by mouth daily. Start taking on: February 03, 2021 What changed:  how much to take Another medication with the same  name was removed. Continue taking this medication, and follow the directions you see here.   sodium chloride 0.65 % Soln nasal spray Commonly known as: OCEAN Place 1 spray into both nostrils as needed for congestion (nose irritation).   torsemide 20 MG tablet Commonly known as: Demadex Take 1 tablet (20 mg total) by mouth daily as needed (edema/ shortness of breath).   triamcinolone 0.1 % paste Commonly known as: KENALOG Apply dental paste to mouth ulcer twice daily as needed               Durable Medical Equipment  (From admission, onward)           Start     Ordered   02/02/21 1228  For home use only DME 4 wheeled rolling walker with seat  Once       Question:  Patient needs a walker to treat with the following condition  Answer:  Weakness   02/02/21 1227              Discharge Care Instructions  (From admission, onward)  Start     Ordered   02/02/21 0000  Discharge wound care:       Comments: Wound care to sacral Stage 2 pressure injury (POA): cleanse with NS, pat dry. Cover with size appropriate piece of xeroform gauze, top with dry gauze 2x2 and cover with silicone foam bordered dressing for sacrum, oriented with "tip" pointed away from anus.Turn side to side per protocol, minimizing time in the supine position.   02/02/21 1225            Follow-up Information     [name], PA. Schedule an appointment as soon as possible for a visit in 1 week(s).   Specialty: Physician Assistant Contact information: [place] [phone]         Leta Baptist, MD. Schedule an appointment as soon as possible for a visit in 1 week(s).   Specialty: Otolaryngology Contact information: [place] [phone]                Allergies  Allergen Reactions   Benadryl Allergy [Diphenhydramine Hcl]     Does not know what it does to him   Reglan [Metoclopramide] Other (See  Comments)    Makes me crazy    Doxycycline Rash    Consultations: Pulmonary Cardiology Heme-onc   Procedures/Studies: DG Chest 2 View  Result Date: 01/26/2021 CLINICAL DATA:  Shortness of breath in a 80 year old male. EXAM: CHEST - 2 VIEW COMPARISON:  December 13, 2020. FINDINGS: Trachea midline. Cardiomediastinal contours and hilar structures grossly stable. Diffuse increased thickening of the interstitium shows some progressive worsening accounting for differences in technique as compared to studies dating back to September 01, 2020. There is no lobar level consolidative process. Findings are worse at the lung bases. No effusion. On limited assessment there is no acute skeletal process. IMPRESSION: Chronic interstitial lung disease with some progressive worsening since June of 2022. This may reflect worsening of interstitial disease, component of superimposed infection or edema could also cause this appearance. Follow-up with high-resolution chest CT may be helpful particularly if there is progressive worsening of shortness of breath. Electronically Signed   By: Zetta Bills M.D.   On: 01/26/2021 14:44   NM Pulmonary Perfusion  Result Date: 01/30/2021 CLINICAL DATA:  Dyspnea concern for pulmonary embolism. Positive D-dimer EXAM: NUCLEAR MEDICINE PERFUSION LUNG SCAN TECHNIQUE: Perfusion images were obtained in multiple projections after intravenous injection of radiopharmaceutical. RADIOPHARMACEUTICALS:  4.3 mCi Tc-29mMAA COMPARISON:  V/Q (perfusion only) scan 09/01/2020, chest radiograph same day FINDINGS: There is a new large peripheral wedge-shaped perfusion defect within the anterior aspect of the RIGHT lower lobe present on the RPO projection. Small wedge-shaped defect within the RIGHT middle lobe is similar to comparison perfusion exam. New posterosuperior peripheral defect within the LEFT upper lobe additionally. IMPRESSION: New wedge-shaped peripheral perfusion defects (RIGHT lower lobe  and LEFT upper lobe) consistent with acute pulmonary emboli. Electronically Signed   By: SSuzy BouchardM.D.   On: 01/30/2021 13:11   UK[place]Venous Img Lower Bilateral (DVT)  Result Date: 01/31/2021 CLINICAL DATA:  History of pulmonary embolus. EXAM: BILATERAL LOWER EXTREMITY VENOUS DOPPLER ULTRASOUND TECHNIQUE: Gray-scale sonography with graded compression, as well as color Doppler and duplex ultrasound were performed to evaluate the lower extremity deep venous systems from the level of the common femoral vein and including the common femoral, femoral, profunda femoral, popliteal and calf veins including the posterior tibial, peroneal and gastrocnemius veins when  visible. The superficial great saphenous vein was also interrogated. Spectral Doppler was utilized to evaluate flow at rest and with distal augmentation maneuvers in the common femoral, femoral and popliteal veins. COMPARISON:  None. FINDINGS: RIGHT LOWER EXTREMITY Common Femoral Vein: No evidence of thrombus. Normal compressibility, respiratory phasicity and response to augmentation. Saphenofemoral Junction: Nonocclusive thrombus is noted. Profunda Femoral Vein: No evidence of thrombus. Normal compressibility and flow on color Doppler imaging. Femoral Vein: No evidence of thrombus. Normal compressibility, respiratory phasicity and response to augmentation. Popliteal Vein: No evidence of thrombus. Normal compressibility, respiratory phasicity and response to augmentation. Calf Veins: No evidence of thrombus. Normal compressibility and flow on color Doppler imaging. Superficial Great Saphenous Vein: Noncompressible consistent with occlusive thrombus and superficial thrombophlebitis. Venous Reflux:  None. Other Findings:  None. LEFT LOWER EXTREMITY Common Femoral Vein: No evidence of thrombus. Normal compressibility, respiratory phasicity and response to augmentation. Saphenofemoral Junction: No evidence of thrombus. Normal compressibility and flow on  color Doppler imaging. Profunda Femoral Vein: No evidence of thrombus. Normal compressibility and flow on color Doppler imaging. Femoral Vein: No evidence of thrombus. Normal compressibility, respiratory phasicity and response to augmentation. Popliteal Vein: No evidence of thrombus. Normal compressibility, respiratory phasicity and response to augmentation. Calf Veins: No evidence of thrombus. Normal compressibility and flow on color Doppler imaging. Superficial Great Saphenous Vein: Occlusive thrombus is noted. Venous Reflux:  None. Other Findings:  Baker's cyst is seen in left popliteal fossa. IMPRESSION: Nonocclusive deep venous thrombus is seen involving the right saphenofemoral junction. Superficial thrombophlebitis is seen involving bilateral greater saphenous veins. Electronically Signed   By: Marijo Conception M.D.   On: 01/31/2021 12:00   Portable chest 1 View  Result Date: 01/30/2021 CLINICAL DATA:  Dyspnea EXAM: PORTABLE CHEST 1 VIEW COMPARISON:  Yesterday FINDINGS: Pulmonary fibrosis with honeycombing by CT. Low volume chest. Cardiomegaly which is chronic and accentuated by mediastinal fat. Increased indistinct density over the right chest. Artifact from EKG leads. IMPRESSION: 1. Question interval pneumonic infiltrate on the right. 2. Pulmonary fibrosis. Electronically Signed   By: Jorje Guild M.D.   On: 01/30/2021 06:08   DG Chest Port 1 View  Result Date: 01/29/2021 CLINICAL DATA:  Increased shortness of breath. History of pulmonary fibrosis. EXAM: PORTABLE CHEST 1 VIEW COMPARISON:  01/26/2021. FINDINGS: Stable heart size and mediastinal contours. Unchanged elevation of right hemidiaphragm. Extensive interstitial thickening consistent with provided history of pulmonary fibrosis, not significantly changed. No superimposed airspace disease. No pneumothorax or pleural effusion. Stable osseous structures. IMPRESSION: Extensive chronic interstitial thickening consistent with provided history of  pulmonary fibrosis. No superimposed acute radiographic abnormality. Electronically Signed   By: Keith Rake M.D.   On: 01/29/2021 18:22   ECHOCARDIOGRAM LIMITED  Result Date: 01/31/2021    ECHOCARDIOGRAM LIMITED REPORT   Patient Name:   DILYN SMILES Date of Exam: 01/31/2021 Medical Rec #:  [id number]       Height:       69.0 in Accession #:    [id number]      Weight:       162.7 lb Date of Birth:  Mar 24, 1941        BSA:          1.892 m Patient Age:    80 years        BP:           121/71 mmHg Patient Gender: M               HR:  93 bpm. Exam Location:  [facility] Procedure: Limited Echo Indications:    Pulmonary Embolus  History:        Patient has prior history of Echocardiogram examinations, most                 recent 12/07/2020. Risk Factors:Hypertension and Diabetes.                 Respiratory failure.  Sonographer:    Wenda Low Referring Phys: [place]  1. Limited study without Doppler.  2. Left ventricular ejection fraction, by estimation, is 60 to 65%. The left ventricle has normal function. The left ventricle has no regional wall motion abnormalities. There is mild left ventricular hypertrophy.  3. Right ventricular systolic function is mildly reduced. The right ventricular size is normal.  4. The aortic valve is tricuspid. Mild to moderate aortic valve sclerosis/calcification is present, without any evidence of aortic stenosis. Comparison(s): Prior images reviewed side by side. RV contraction is mildly reduced. FINDINGS  Left Ventricle: Left ventricular ejection fraction, by estimation, is 60 to 65%. The left ventricle has normal function. The left ventricle has no regional wall motion abnormalities. The left ventricular internal cavity size was normal in size. There is  mild left ventricular hypertrophy. Right Ventricle: The right ventricular size is normal. Right ventricular systolic function is mildly reduced. Pericardium: There is no evidence of  pericardial effusion. Aortic Valve: The aortic valve is tricuspid. There is mild aortic valve annular calcification. Mild to moderate aortic valve sclerosis/calcification is present, without any evidence of aortic stenosis. Pulmonic Valve: The pulmonic valve was grossly normal. Aorta: The aortic root is normal in size and structure. LEFT VENTRICLE PLAX 2D LVIDd:         4.20 cm LVIDs:         2.30 cm LV PW:         1.20 cm LV IVS:        1.20 cm LVOT diam:     2.00 cm LVOT Area:     3.14 cm  LEFT ATRIUM         Index LA diam:    3.80 cm 2.01 cm/m   AORTA Ao Root diam: 3.70 cm  SHUNTS Systemic Diam: 2.00 cm Rozann Lesches MD Electronically signed by Rozann Lesches MD Signature Date/Time: 01/31/2021/2:04:15 PM    Final      Discharge Exam: Vitals:   02/02/21 0846 02/02/21 1142  BP:    Pulse:    Resp:    Temp:  97.7 F (36.5 C)  SpO2: 96%    Vitals:   02/02/21 0600 02/02/21 0700 02/02/21 0846 02/02/21 1142  BP: 134/84 131/86    Pulse: 73 61    Resp: 20 18    Temp:  97.8 F (36.6 C)  97.7 F (36.5 C)  TempSrc:  Oral  Oral  SpO2: 95% 96% 96%   Weight:      Height:        General: Pt is alert, awake, not in acute distress Cardiovascular: RRR, S1/S2 +, no rubs, no gallops Respiratory: CTA bilaterally, no wheezing, no rhonchi, currently on nasal cannula oxygen Abdominal: Soft, NT, ND, bowel sounds + Extremities: no edema, no cyanosis    The results of significant diagnostics from this hospitalization (including imaging, microbiology, ancillary and laboratory) are listed below for reference.     Microbiology: Recent Results (from the past 240 hour(s))  Resp Panel by RT-PCR (Flu A&B, Covid) Nasopharyngeal Swab  Status: None   Collection Time: 01/29/21  7:20 PM   Specimen: Nasopharyngeal Swab; Nasopharyngeal(NP) swabs in vial transport medium  Result Value Ref Range Status   SARS Coronavirus 2 by RT PCR NEGATIVE NEGATIVE Final    Comment: (NOTE) SARS-CoV-2 target nucleic  acids are NOT DETECTED.  The SARS-CoV-2 RNA is generally detectable in upper respiratory specimens during the acute phase of infection. The lowest concentration of SARS-CoV-2 viral copies this assay can detect is 138 copies/mL. A negative result does not preclude SARS-Cov-2 infection and should not be used as the sole basis for treatment or other patient management decisions. A negative result may occur with  improper specimen collection/handling, submission of specimen other than nasopharyngeal swab, presence of viral mutation(s) within the areas targeted by this assay, and inadequate number of viral copies(<138 copies/mL). A negative result must be combined with clinical observations, patient history, and epidemiological information. The expected result is Negative.  Fact Sheet for Patients:  EntrepreneurPulse.com.au  Fact Sheet for Healthcare Providers:  IncredibleEmployment.be  This test is no t yet approved or cleared by the [place] FDA and  has been authorized for detection and/or diagnosis of SARS-CoV-2 by FDA under an Emergency Use Authorization (EUA). This EUA will remain  in effect (meaning this test can be used) for the duration of the COVID-19 declaration under Section 564(b)(1) of the Act, 21 U.S.C.section 360bbb-3(b)(1), unless the authorization is terminated  or revoked sooner.       Influenza A by PCR NEGATIVE NEGATIVE Final   Influenza B by PCR NEGATIVE NEGATIVE Final    Comment: (NOTE) The Xpert Xpress SARS-CoV-2/FLU/RSV plus assay is intended as an aid in the diagnosis of influenza from Nasopharyngeal swab specimens and should not be used as a sole basis for treatment. Nasal washings and aspirates are unacceptable for Xpert Xpress SARS-CoV-2/FLU/RSV testing.  Fact Sheet for Patients: EntrepreneurPulse.com.au  Fact Sheet for Healthcare Providers: IncredibleEmployment.be  This  test is not yet approved or cleared by the [place] FDA and has been authorized for detection and/or diagnosis of SARS-CoV-2 by FDA under an Emergency Use Authorization (EUA). This EUA will remain in effect (meaning this test can be used) for the duration of the COVID-19 declaration under Section 564(b)(1) of the Act, 21 U.S.C. section 360bbb-3(b)(1), unless the authorization is terminated or revoked.  Performed at [facility], [address], [place], [place] [address]   MRSA Next Gen by PCR, Nasal     Status: Abnormal   Collection Time: 01/30/21  5:02 PM   Specimen: Nasal Mucosa; Nasal Swab  Result Value Ref Range Status   MRSA by PCR Next Gen DETECTED (A) NOT DETECTED Final    Comment: RESULT CALLED TO, READ BACK BY AND VERIFIED WITH:  A.[place] @ 1126 01/31/21 BY STEPHTR (NOTE) The GeneXpert MRSA Assay (FDA approved for NASAL specimens only), is one component of a comprehensive MRSA colonization surveillance program. It is not intended to diagnose MRSA infection nor to guide or monitor treatment for MRSA infections. Test performance is not FDA approved in patients less than 59 years old. Performed at [facility], [address], [place], [place] [address]      Labs: BNP (last 3 results) Recent Labs    06/11/20 2009 01/29/21 1721  BNP 38.0 25.3   Basic Metabolic Panel: Recent Labs  Lab 01/29/21 1723 01/30/21 0358 01/31/21 0319 02/01/21 0532 02/02/21 0425  NA 134* 134* 136 138 138  K 4.8 4.9 4.4 4.6 4.3  CL 99 101 102  105 106  CO2 _0 GLUCOSE 263* 326* 68* 77 154*  BUN 43* 46* 52* 51* 41*  CREATININE 2.03* 2.18* 2.12* 2.04* 1.77*  CALCIUM 8.8* 8.9 8.9 9.0 8.8*  MG  --   --  2.6* 2.5* 2.3   Liver Function Tests: Recent Labs  Lab 01/29/21 1723 01/30/21 0358  AST 21 22  ALT 28 28  ALKPHOS 93 87  BILITOT 0.1* 0.4  PROT 6.2* 5.8*  ALBUMIN 3.3* 3.1*   No results for input(s): LIPASE, AMYLASE in the last 168 hours. No results for input(s):  AMMONIA in the last 168 hours. CBC: Recent Labs  Lab 01/29/21 1723 01/30/21 0358 01/31/21 0319 02/02/21 0425  WBC 21.6* 22.2* 23.5* 16.3*  NEUTROABS 9.0*  --   --   --   HGB 13.3 12.0* 11.4* 12.1*  HCT 40.3 36.8* 34.7* 37.7*  MCV 92.0 92.5 93.0 94.3  PLT 163 144* 157 143*   Cardiac Enzymes: No results for input(s): CKTOTAL, CKMB, CKMBINDEX, TROPONINI in the last 168 hours. BNP: Invalid input(s): POCBNP CBG: Recent Labs  Lab 02/01/21 1559 02/01/21 2139 02/02/21 0420 02/02/21 0731 02/02/21 1145  GLUCAP 295* 226* 168* 100* 192*   D-Dimer No results for input(s): DDIMER in the last 72 hours. Hgb A1c No results for input(s): HGBA1C in the last 72 hours. Lipid Profile No results for input(s): CHOL, HDL, LDLCALC, TRIG, CHOLHDL, LDLDIRECT in the last 72 hours. Thyroid function studies No results for input(s): TSH, T4TOTAL, T3FREE, THYROIDAB in the last 72 hours.  Invalid input(s): FREET3 Anemia work up No results for input(s): VITAMINB12, FOLATE, FERRITIN, TIBC, IRON, RETICCTPCT in the last 72 hours. Urinalysis No results found for: COLORURINE, APPEARANCEUR, [place], [place], GLUCOSEU, [place], [place], [place], PROTEINUR, UROBILINOGEN, NITRITE, LEUKOCYTESUR Sepsis Labs Invalid input(s): PROCALCITONIN,  WBC,  LACTICIDVEN Microbiology Recent Results (from the past 240 hour(s))  Resp Panel by RT-PCR (Flu A&B, Covid) Nasopharyngeal Swab     Status: None   Collection Time: 01/29/21  7:20 PM   Specimen: Nasopharyngeal Swab; Nasopharyngeal(NP) swabs in vial transport medium  Result Value Ref Range Status   SARS Coronavirus 2 by RT PCR NEGATIVE NEGATIVE Final    Comment: (NOTE) SARS-CoV-2 target nucleic acids are NOT DETECTED.  The SARS-CoV-2 RNA is generally detectable in upper respiratory specimens during the acute phase of infection. The lowest concentration of SARS-CoV-2 viral copies this assay can detect is 138 copies/mL. A negative result does not preclude  SARS-Cov-2 infection and should not be used as the sole basis for treatment or other patient management decisions. A negative result may occur with  improper specimen collection/handling, submission of specimen other than nasopharyngeal swab, presence of viral mutation(s) within the areas targeted by this assay, and inadequate number of viral copies(<138 copies/mL). A negative result must be combined with clinical observations, patient history, and epidemiological information. The expected result is Negative.  Fact Sheet for Patients:  EntrepreneurPulse.com.au  Fact Sheet for Healthcare Providers:  IncredibleEmployment.be  This test is no t yet approved or cleared by the [place] FDA and  has been authorized for detection and/or diagnosis of SARS-CoV-2 by FDA under an Emergency Use Authorization (EUA). This EUA will remain  in effect (meaning this test can be used) for the duration of the COVID-19 declaration under Section 564(b)(1) of the Act, 21 U.S.C.section 360bbb-3(b)(1), unless the authorization is terminated  or revoked sooner.       Influenza A by PCR NEGATIVE NEGATIVE Final   Influenza B by  PCR NEGATIVE NEGATIVE Final    Comment: (NOTE) The Xpert Xpress SARS-CoV-2/FLU/RSV plus assay is intended as an aid in the diagnosis of influenza from Nasopharyngeal swab specimens and should not be used as a sole basis for treatment. Nasal washings and aspirates are unacceptable for Xpert Xpress SARS-CoV-2/FLU/RSV testing.  Fact Sheet for Patients: EntrepreneurPulse.com.au  Fact Sheet for Healthcare Providers: IncredibleEmployment.be  This test is not yet approved or cleared by the [place] FDA and has been authorized for detection and/or diagnosis of SARS-CoV-2 by FDA under an Emergency Use Authorization (EUA). This EUA will remain in effect (meaning this test can be used) for the duration of  the COVID-19 declaration under Section 564(b)(1) of the Act, 21 U.S.C. section 360bbb-3(b)(1), unless the authorization is terminated or revoked.  Performed at [facility], [address], [place], [place] [address]   MRSA Next Gen by PCR, Nasal     Status: Abnormal   Collection Time: 01/30/21  5:02 PM   Specimen: Nasal Mucosa; Nasal Swab  Result Value Ref Range Status   MRSA by PCR Next Gen DETECTED (A) NOT DETECTED Final    Comment: RESULT CALLED TO, READ BACK BY AND VERIFIED WITH:  A.[place] @ 1126 01/31/21 BY STEPHTR (NOTE) The GeneXpert MRSA Assay (FDA approved for NASAL specimens only), is one component of a comprehensive MRSA colonization surveillance program. It is not intended to diagnose MRSA infection nor to guide or monitor treatment for MRSA infections. Test performance is not FDA approved in patients less than 46 years old. Performed at [facility], [address], [place], [place] [address]      Time coordinating discharge: 35 minutes  SIGNED:   Rodena Goldmann, DO [facility] 02/02/2021, 12:28 PM  If 7PM-7AM, please contact night-coverage [email or web address]

## 2021-02-02 NOTE — Progress Notes (Signed)
Discharge instructions reviewed with patient and family. Lovenox injection instructions reviewed with patient and family both verbalized and demonstrated knowledge of delivery of instruction. Patient discharged home with family in stable condition.

## 2021-02-02 NOTE — Evaluation (Signed)
Physical Therapy Evaluation Patient Details Name: Jeffrey Brennan MRN: [id number] DOB: [birth year] Today's Date: 02/02/2021  History of Present Illness  Jeffrey Brennan  is a 80 y.o. male, with history of chronic renal insufficiency, CLL, diabetes type 2, hypertension, pulmonary fibrosis, and more presents ED with a chief.  Patient reports that he is woken up suddenly out of sleep at 4 AM due to worsening dyspnea.  He reports he was able to tripod to get the dyspnea to improve, and he was back to sleep.  Again it happened at 6 AM.  He reports that his dyspnea is coming in waves.  It is especially worse if he has any minimal exertion.  Is also worse if he lays back.  Patient reports that he has had peripheral edema for which he is seeing an outpatient physician.  They increased his Lasix from 40 mg daily to 40 mg twice daily, patient reports no improvement.  Patient reports that he has not had much urine output either.  Recently saw [facility] [facility].  Their note reveals that patient has not been able to tolerate antifibrotic's.  Not a candidate for clinical trials given his CLL.  He was being seen for worsening dyspnea.  He did have proBNP which was mildly elevated at 895.  BNP here was 91.  Neither number is definitely indicative of CHF.  I also did a D-dimer which came back elevated at 1.65.  Patient was scheduled to have a VQ scan on Monday.  Patient's last echo was in September and showed ejection fraction of 60 to 65% with indeterminate diastolic parameters.  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  In June 2022 he had a small segmental defect on his VQ scan.  He had bleeds on Eliquis, even when adjusted down to low-dose Eliquis patient was in the ER for epistaxis.   Clinical Impression  Patient demonstrates good return for sitting up at bedside, slow labored movement with frequent rest breaks during transfers and taking steps in room mostly due to SOB,  frequent leaning on bed rail to take rest breaks when walking in room and limited mostly due to c/o fatigue and difficulty breathing with SpO2 dropping from 92% to 85% while on 2 LPM, increased wall O2 to 3 LPM - RN notified.  Patient tolerated sitting up in chair after therapy with his son present in room after therapy.  Patient will benefit from continued physical therapy in hospital and recommended venue below to increase strength, balance, endurance for safe ADLs and gait.         Recommendations for follow up therapy are one component of a multi-disciplinary discharge planning process, led by the attending physician.  Recommendations may be updated based on patient status, additional functional criteria and insurance authorization.  Follow Up Recommendations Home health PT    Assistance Recommended at Discharge Intermittent Supervision/Assistance  Functional Status Assessment Patient has had a recent decline in their functional status and demonstrates the ability to make significant improvements in function in a reasonable and predictable amount of time.   Equipment Recommendations  Other (comment) (Optometrist    Recommendations for Other Services       Precautions / Restrictions Precautions Precautions: Fall Restrictions Weight Bearing Restrictions: No      Mobility  Bed Mobility Overal bed mobility: Modified Independent                  Transfers Overall transfer level:  Needs assistance Equipment used: None Transfers: Sit to/from [facility] Sit to Stand: Supervision Stand pivot transfers: Supervision         General transfer comment: increased time, labored movement    Ambulation/Gait Ambulation/Gait assistance: Supervision;Min guard Gait Distance (Feet): 18 Feet Assistive device: None Gait Pattern/deviations: Decreased step length - right;Decreased step length - left;Decreased stride length Gait velocity: decreased   General Gait  Details: slow labored cadence with frequent leaning on bed rail for support mostly due to SOB and fatigue  Stairs            Wheelchair Mobility    Modified Rankin (Stroke Patients Only)       Balance Overall balance assessment: Needs assistance Sitting-balance support: Feet supported;No upper extremity supported Sitting balance-Leahy Scale: Good Sitting balance - Comments: seated at EOB   Standing balance support: During functional activity;No upper extremity supported Standing balance-Leahy Scale: Fair Standing balance comment: has to frequently lean on bed rail mostly due to c/o SOB, otherwise fair/good standing without support                             Pertinent Vitals/Pain Pain Assessment: No/denies pain    Home Living Family/patient expects to be discharged to:: Private residence Living Arrangements: Alone Available Help at Discharge: Family;Available PRN/intermittently Type of Home: House Home Access: Ramped entrance       Home Layout: One level Home Equipment: None      Prior Function Prior Level of Function : Needs assist       Physical Assist : Mobility (physical) Mobility (physical): Bed mobility;Transfers;Gait   Mobility Comments: household and very short distanced community ambulator, uses Physiological scientist at grocery stores, has to frequently lean on furniture for support at home mostly due to SOB, on 3 LPM home O2 ADLs Comments: assisted by family     Hand Dominance        Extremity/Trunk Assessment   Upper Extremity Assessment Upper Extremity Assessment: Overall WFL for tasks assessed    Lower Extremity Assessment Lower Extremity Assessment: Generalized weakness    Cervical / Trunk Assessment Cervical / Trunk Assessment: Normal  Communication   Communication: No difficulties  Cognition Arousal/Alertness: Awake/alert Behavior During Therapy: WFL for tasks assessed/performed Overall Cognitive Status: Within Functional  Limits for tasks assessed                                          General Comments      Exercises     Assessment/Plan    PT Assessment Patient needs continued PT services  PT Problem List Decreased strength;Decreased activity tolerance;Decreased balance;Decreased mobility       PT Treatment Interventions DME instruction;Gait training;Stair training;Functional mobility training;Therapeutic activities;Therapeutic exercise;Balance training;Patient/family education    PT Goals (Current goals can be found in the Care Plan section)  Acute Rehab PT Goals Patient Stated Goal: return home with family to assist PT Goal Formulation: With patient/family Time For Goal Achievement: 02/09/21 Potential to Achieve Goals: Good    Frequency Min 3X/week   Barriers to discharge        Co-evaluation               AM-PAC PT "6 Clicks" Mobility  Outcome Measure Help needed turning from your back to your side while in a flat bed without using bedrails?: None Help  needed moving from lying on your back to sitting on the side of a flat bed without using bedrails?: None Help needed moving to and from a bed to a chair (including a wheelchair)?: A Little Help needed standing up from a chair using your arms (e.g., wheelchair or bedside chair)?: A Little Help needed to walk in hospital room?: A Little Help needed climbing 3-5 steps with a railing? : A Little 6 Click Score: 20    End of Session Equipment Utilized During Treatment: Oxygen Activity Tolerance: Patient tolerated treatment well;Patient limited by fatigue Patient left: in chair;with call bell/phone within reach;with family/visitor present Nurse Communication: Mobility status PT Visit Diagnosis: Unsteadiness on feet (R26.81);Other abnormalities of gait and mobility (R26.89);Muscle weakness (generalized) (M62.81)    Time: 6060-0459 PT Time Calculation (min) (ACUTE ONLY): 26 min   Charges:   PT Evaluation $PT  Eval Moderate Complexity: 1 Mod PT Treatments $Therapeutic Activity: 23-37 mins        12:19 PM, 02/02/21 Lonell Grandchild, MPT Physical Therapist with [facility] [phone] office [phone] mobile phone

## 2021-02-02 NOTE — Progress Notes (Signed)
Palliative: Mr. Tennell is lying quietly in bed.  He greets me, making and mostly keeping eye contact.  He appears chronically ill and somewhat frail.  He is alert and oriented, able to make his basic needs known.  There is no family at bedside at this time.  We talked about oncology visit yesterday and recommendations.  Mr. Keltner is able to accurately share discussion and plan.  We talked about the use of Lovenox as a blood thinner.  Mr. Craine states that he feels he can give himself these injections.  We also talked about the benefits of a small dose by mouth morphine for breathlessness/air hunger.  He is agreeable to trial.  Discussed with bedside nursing staff.  PT evaluation for home health, but Mr. Balthazor feels that he cannot care for himself at home with the help of his family.  His daughter-in-law is an Therapist, sports and his son is available to assist as needed.  Plan: Continue to treat the treatable but no intubation.  Active with outpatient palliative services with [facility]  Continue surveillance for CLL with [facility].  57 minutes Quinn Axe, NP Palliative medicine team Team phone [phone] Greater than 50% of this time was spent counseling and coordinating care related to the above assessment and plan.

## 2021-02-03 DIAGNOSIS — R040 Epistaxis: Secondary | ICD-10-CM | POA: Diagnosis not present
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
## 2021-02-08 ENCOUNTER — Inpatient Hospital Stay (HOSPITAL_COMMUNITY): Payer: PPO | Attending: Hematology | Admitting: Hematology

## 2021-02-08 ENCOUNTER — Other Ambulatory Visit: Payer: Self-pay

## 2021-02-08 VITALS — BP 143/86 | HR 108 | Temp 98.8°F | Resp 20 | Wt 161.6 lb

## 2021-02-08 DIAGNOSIS — J8410 Pulmonary fibrosis, unspecified: Secondary | ICD-10-CM | POA: Diagnosis not present

## 2021-02-08 DIAGNOSIS — Z7952 Long term (current) use of systemic steroids: Secondary | ICD-10-CM | POA: Diagnosis not present

## 2021-02-08 DIAGNOSIS — R42 Dizziness and giddiness: Secondary | ICD-10-CM | POA: Insufficient documentation

## 2021-02-08 DIAGNOSIS — I1310 Hypertensive heart and chronic kidney disease without heart failure, with stage 1 through stage 4 chronic kidney disease, or unspecified chronic kidney disease: Secondary | ICD-10-CM | POA: Diagnosis not present

## 2021-02-08 DIAGNOSIS — Z79899 Other long term (current) drug therapy: Secondary | ICD-10-CM | POA: Diagnosis not present

## 2021-02-08 DIAGNOSIS — Z86711 Personal history of pulmonary embolism: Secondary | ICD-10-CM | POA: Diagnosis not present

## 2021-02-08 DIAGNOSIS — R0981 Nasal congestion: Secondary | ICD-10-CM | POA: Insufficient documentation

## 2021-02-08 DIAGNOSIS — R109 Unspecified abdominal pain: Secondary | ICD-10-CM | POA: Diagnosis not present

## 2021-02-08 DIAGNOSIS — R040 Epistaxis: Secondary | ICD-10-CM | POA: Diagnosis not present

## 2021-02-08 DIAGNOSIS — Z7901 Long term (current) use of anticoagulants: Secondary | ICD-10-CM | POA: Insufficient documentation

## 2021-02-08 DIAGNOSIS — R079 Chest pain, unspecified: Secondary | ICD-10-CM | POA: Insufficient documentation

## 2021-02-08 DIAGNOSIS — I809 Phlebitis and thrombophlebitis of unspecified site: Secondary | ICD-10-CM | POA: Diagnosis not present

## 2021-02-08 DIAGNOSIS — N183 Chronic kidney disease, stage 3 unspecified: Secondary | ICD-10-CM | POA: Insufficient documentation

## 2021-02-08 DIAGNOSIS — R0602 Shortness of breath: Secondary | ICD-10-CM | POA: Diagnosis not present

## 2021-02-08 DIAGNOSIS — Z888 Allergy status to other drugs, medicaments and biological substances status: Secondary | ICD-10-CM | POA: Diagnosis not present

## 2021-02-08 DIAGNOSIS — M109 Gout, unspecified: Secondary | ICD-10-CM | POA: Diagnosis not present

## 2021-02-08 DIAGNOSIS — E1122 Type 2 diabetes mellitus with diabetic chronic kidney disease: Secondary | ICD-10-CM | POA: Diagnosis not present

## 2021-02-08 DIAGNOSIS — Z881 Allergy status to other antibiotic agents status: Secondary | ICD-10-CM | POA: Insufficient documentation

## 2021-02-08 DIAGNOSIS — C9110 Chronic lymphocytic leukemia of B-cell type not having achieved remission: Secondary | ICD-10-CM | POA: Diagnosis not present

## 2021-02-08 DIAGNOSIS — R5383 Other fatigue: Secondary | ICD-10-CM | POA: Insufficient documentation

## 2021-02-08 DIAGNOSIS — Z801 Family history of malignant neoplasm of trachea, bronchus and lung: Secondary | ICD-10-CM | POA: Diagnosis not present

## 2021-02-08 DIAGNOSIS — R059 Cough, unspecified: Secondary | ICD-10-CM | POA: Diagnosis not present

## 2021-02-08 DIAGNOSIS — K219 Gastro-esophageal reflux disease without esophagitis: Secondary | ICD-10-CM | POA: Insufficient documentation

## 2021-02-08 DIAGNOSIS — Z803 Family history of malignant neoplasm of breast: Secondary | ICD-10-CM | POA: Diagnosis not present

## 2021-02-08 NOTE — Patient Instructions (Signed)
[place] at [facility] Discharge Instructions  You were seen and examined today by Dr. Delton Coombes. He wants you to continue the same dose of blood thinner until you see Dr. Benjamine Mola and your cardiologist. We will discuss increasing to full dose or Eliqus at next appointment. Please follow up as scheduled in 4 weeks.    Thank you for choosing [place] at [facility] to provide your oncology and hematology care.  To afford each patient quality time with our provider, please arrive at least 15 minutes before your scheduled appointment time.   If you have a lab appointment with the [facility] please come in thru the Main Entrance and check in at the main information desk.  You need to re-schedule your appointment should you arrive 10 or more minutes late.  We strive to give you quality time with our providers, and arriving late affects you and other patients whose appointments are after yours.  Also, if you no show three or more times for appointments you may be dismissed from the clinic at the providers discretion.     Again, thank you for choosing [facility].  Our hope is that these requests will decrease the amount of time that you wait before being seen by our physicians.       _____________________________________________________________  Should you have questions after your visit to [facility], please contact our office at [phone] and follow the prompts.  Our office hours are 8:00 a.m. and 4:30 p.m. Monday - Friday.  Please note that voicemails left after 4:00 p.m. may not be returned until the following business day.  We are closed weekends and major holidays.  You do have access to a nurse 24-7, just call the main number to the clinic [phone] and do not press any options, hold on the line and a nurse will answer the phone.    For prescription refill requests, have your pharmacy contact our office and allow  72 hours.    Due to Covid, you will need to wear a mask upon entering the hospital. If you do not have a mask, a mask will be given to you at the Main Entrance upon arrival. For doctor visits, patients may have 1 support person age 87 or older with them. For treatment visits, patients can not have anyone with them due to social distancing guidelines and our immunocompromised population.

## 2021-02-09 ENCOUNTER — Encounter: Payer: Self-pay | Admitting: Cardiology

## 2021-02-09 ENCOUNTER — Ambulatory Visit: Payer: PPO | Admitting: Cardiology

## 2021-02-09 VITALS — BP 102/64 | HR 99 | Ht 69.0 in | Wt 161.0 lb

## 2021-02-09 DIAGNOSIS — R7989 Other specified abnormal findings of blood chemistry: Secondary | ICD-10-CM

## 2021-02-09 DIAGNOSIS — I272 Pulmonary hypertension, unspecified: Secondary | ICD-10-CM | POA: Diagnosis not present

## 2021-02-09 DIAGNOSIS — I10 Essential (primary) hypertension: Secondary | ICD-10-CM

## 2021-02-09 DIAGNOSIS — I2699 Other pulmonary embolism without acute cor pulmonale: Secondary | ICD-10-CM

## 2021-02-09 DIAGNOSIS — R0609 Other forms of dyspnea: Secondary | ICD-10-CM

## 2021-02-09 NOTE — Patient Instructions (Signed)
Medication Instructions:  Your physician recommends that you continue on your current medications as directed. Please refer to the Current Medication list given to you today.  *If you need a refill on your cardiac medications before your next appointment, please call your pharmacy*   Lab Work: None If you have labs (blood work) drawn today and your tests are completely normal, you will receive your results only by: [place] (if you have MyChart) OR A paper copy in the mail If you have any lab test that is abnormal or we need to change your treatment, we will call you to review the results.   Testing/Procedures: None   Follow-Up: At [facility], you and your health needs are our priority.  As part of our continuing mission to provide you with exceptional heart care, we have created designated Provider Care Teams.  These Care Teams include your primary Cardiologist (physician) and Advanced Practice Providers (APPs -  Physician Assistants and Nurse Practitioners) who all work together to provide you with the care you need, when you need it.  We recommend signing up for the patient portal called "MyChart".  Sign up information is provided on this After Visit Summary.  MyChart is used to connect with patients for Virtual Visits (Telemedicine).  Patients are able to view lab/test results, encounter notes, upcoming appointments, etc.  Non-urgent messages can be sent to your provider as well.   To learn more about what you can do with MyChart, go to [email or web address].    Your next appointment:   Follow Up: As Needed  Other Instructions
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
## 2021-02-10 DIAGNOSIS — I749 Embolism and thrombosis of unspecified artery: Secondary | ICD-10-CM | POA: Diagnosis not present

## 2021-02-10 DIAGNOSIS — L89322 Pressure ulcer of left buttock, stage 2: Secondary | ICD-10-CM | POA: Diagnosis not present

## 2021-02-10 DIAGNOSIS — J8410 Pulmonary fibrosis, unspecified: Secondary | ICD-10-CM | POA: Diagnosis not present

## 2021-02-10 DIAGNOSIS — B379 Candidiasis, unspecified: Secondary | ICD-10-CM | POA: Diagnosis not present

## 2021-02-10 DIAGNOSIS — Z515 Encounter for palliative care: Secondary | ICD-10-CM | POA: Diagnosis not present

## 2021-02-16 ENCOUNTER — Other Ambulatory Visit (HOSPITAL_BASED_OUTPATIENT_CLINIC_OR_DEPARTMENT_OTHER): Payer: Self-pay

## 2021-02-16 ENCOUNTER — Telehealth: Payer: Self-pay | Admitting: Pulmonary Disease

## 2021-02-16 NOTE — Telephone Encounter (Signed)
PM please advise if you rec that the pt now get the flu vaccine .   thanks

## 2021-02-17 NOTE — Telephone Encounter (Signed)
Yes he is okay to get flu vaccine

## 2021-02-17 NOTE — Telephone Encounter (Signed)
Called and spoke with patient. He verbalized understanding. He will proceed with the flu vaccine.   Nothing further needed at time of call.

## 2021-02-25 DIAGNOSIS — J9690 Respiratory failure, unspecified, unspecified whether with hypoxia or hypercapnia: Secondary | ICD-10-CM | POA: Diagnosis not present

## 2021-02-25 DIAGNOSIS — R000 Tachycardia, unspecified: Secondary | ICD-10-CM | POA: Diagnosis not present

## 2021-02-25 DIAGNOSIS — J9601 Acute respiratory failure with hypoxia: Secondary | ICD-10-CM | POA: Diagnosis not present

## 2021-02-25 DIAGNOSIS — Z20822 Contact with and (suspected) exposure to covid-19: Secondary | ICD-10-CM | POA: Diagnosis not present

## 2021-02-25 DIAGNOSIS — J189 Pneumonia, unspecified organism: Secondary | ICD-10-CM | POA: Diagnosis not present

## 2021-02-25 DIAGNOSIS — J811 Chronic pulmonary edema: Secondary | ICD-10-CM | POA: Diagnosis not present

## 2021-02-25 DIAGNOSIS — R0602 Shortness of breath: Secondary | ICD-10-CM | POA: Diagnosis not present

## 2021-02-25 DIAGNOSIS — A419 Sepsis, unspecified organism: Secondary | ICD-10-CM | POA: Diagnosis not present

## 2021-03-03 DEATH — deceased

## 2021-03-10 ENCOUNTER — Other Ambulatory Visit (HOSPITAL_COMMUNITY): Payer: PPO

## 2021-03-10 ENCOUNTER — Ambulatory Visit (HOSPITAL_COMMUNITY): Payer: PPO | Admitting: Hematology
